# Patient Record
Sex: Female | Born: 1965
Health system: Southern US, Community
[De-identification: ages and names within clinical notes are randomized; demographics above are authoritative.]

## PROBLEM LIST (undated history)

## (undated) DIAGNOSIS — F329 Major depressive disorder, single episode, unspecified: Secondary | ICD-10-CM

## (undated) DIAGNOSIS — Z8489 Family history of other specified conditions: Secondary | ICD-10-CM

## (undated) DIAGNOSIS — M1990 Unspecified osteoarthritis, unspecified site: Secondary | ICD-10-CM

## (undated) DIAGNOSIS — D649 Anemia, unspecified: Secondary | ICD-10-CM

## (undated) DIAGNOSIS — R51 Headache: Secondary | ICD-10-CM

## (undated) DIAGNOSIS — F32A Depression, unspecified: Secondary | ICD-10-CM

## (undated) DIAGNOSIS — J40 Bronchitis, not specified as acute or chronic: Secondary | ICD-10-CM

## (undated) DIAGNOSIS — F429 Obsessive-compulsive disorder, unspecified: Secondary | ICD-10-CM

## (undated) DIAGNOSIS — D569 Thalassemia, unspecified: Secondary | ICD-10-CM

## (undated) DIAGNOSIS — D573 Sickle-cell trait: Secondary | ICD-10-CM

## (undated) DIAGNOSIS — F419 Anxiety disorder, unspecified: Secondary | ICD-10-CM

## (undated) DIAGNOSIS — K219 Gastro-esophageal reflux disease without esophagitis: Secondary | ICD-10-CM

## (undated) DIAGNOSIS — R140 Abdominal distension (gaseous): Secondary | ICD-10-CM

## (undated) DIAGNOSIS — R05 Cough: Secondary | ICD-10-CM

## (undated) DIAGNOSIS — R112 Nausea with vomiting, unspecified: Secondary | ICD-10-CM

## (undated) DIAGNOSIS — R059 Cough, unspecified: Secondary | ICD-10-CM

## (undated) HISTORY — DX: Cough, unspecified: R05.9

## (undated) HISTORY — DX: Anemia, unspecified: D64.9

## (undated) HISTORY — DX: Cough: R05

## (undated) HISTORY — DX: Abdominal distension (gaseous): R14.0

## (undated) HISTORY — DX: Nausea with vomiting, unspecified: R11.2

---

## 1998-01-23 HISTORY — PX: MOUTH SURGERY: SHX715

## 1999-09-14 ENCOUNTER — Other Ambulatory Visit: Admission: RE | Admit: 1999-09-14 | Discharge: 1999-09-14 | Payer: Self-pay | Admitting: *Deleted

## 2001-02-05 ENCOUNTER — Emergency Department (HOSPITAL_COMMUNITY): Admission: EM | Admit: 2001-02-05 | Discharge: 2001-02-06 | Payer: Self-pay | Admitting: Emergency Medicine

## 2001-03-13 ENCOUNTER — Ambulatory Visit (HOSPITAL_COMMUNITY): Admission: RE | Admit: 2001-03-13 | Discharge: 2001-03-13 | Payer: Self-pay | Admitting: Family Medicine

## 2001-03-13 ENCOUNTER — Encounter: Payer: Self-pay | Admitting: Family Medicine

## 2002-01-02 ENCOUNTER — Ambulatory Visit (HOSPITAL_COMMUNITY): Admission: RE | Admit: 2002-01-02 | Discharge: 2002-01-02 | Payer: Self-pay | Admitting: Family Medicine

## 2002-01-02 ENCOUNTER — Encounter: Payer: Self-pay | Admitting: Family Medicine

## 2002-01-06 ENCOUNTER — Ambulatory Visit: Admission: RE | Admit: 2002-01-06 | Discharge: 2002-01-06 | Payer: Self-pay | Admitting: Family Medicine

## 2002-02-14 ENCOUNTER — Other Ambulatory Visit: Admission: RE | Admit: 2002-02-14 | Discharge: 2002-02-14 | Payer: Self-pay | Admitting: Family Medicine

## 2002-03-11 ENCOUNTER — Ambulatory Visit (HOSPITAL_BASED_OUTPATIENT_CLINIC_OR_DEPARTMENT_OTHER): Admission: RE | Admit: 2002-03-11 | Discharge: 2002-03-11 | Payer: Self-pay | Admitting: Family Medicine

## 2004-04-20 ENCOUNTER — Emergency Department (HOSPITAL_COMMUNITY): Admission: EM | Admit: 2004-04-20 | Discharge: 2004-04-20 | Payer: Self-pay | Admitting: Family Medicine

## 2004-05-28 ENCOUNTER — Emergency Department (HOSPITAL_COMMUNITY): Admission: EM | Admit: 2004-05-28 | Discharge: 2004-05-28 | Payer: Self-pay | Admitting: Family Medicine

## 2006-03-21 ENCOUNTER — Other Ambulatory Visit: Admission: RE | Admit: 2006-03-21 | Discharge: 2006-03-21 | Payer: Self-pay | Admitting: *Deleted

## 2007-12-30 ENCOUNTER — Other Ambulatory Visit: Admission: RE | Admit: 2007-12-30 | Discharge: 2007-12-30 | Payer: Self-pay | Admitting: Family Medicine

## 2008-05-25 ENCOUNTER — Encounter: Admission: RE | Admit: 2008-05-25 | Discharge: 2008-05-25 | Payer: Self-pay | Admitting: Family Medicine

## 2008-05-25 IMAGING — CR DG CHEST 2V
2 series · 2 of 2 positions shown · non-contrast
Comparison: No prior studies.

CLINICAL DATA: Shortness of breath, asthma exacerbation.

CHEST - 2 VIEW

[view not recorded (1 of 2)]
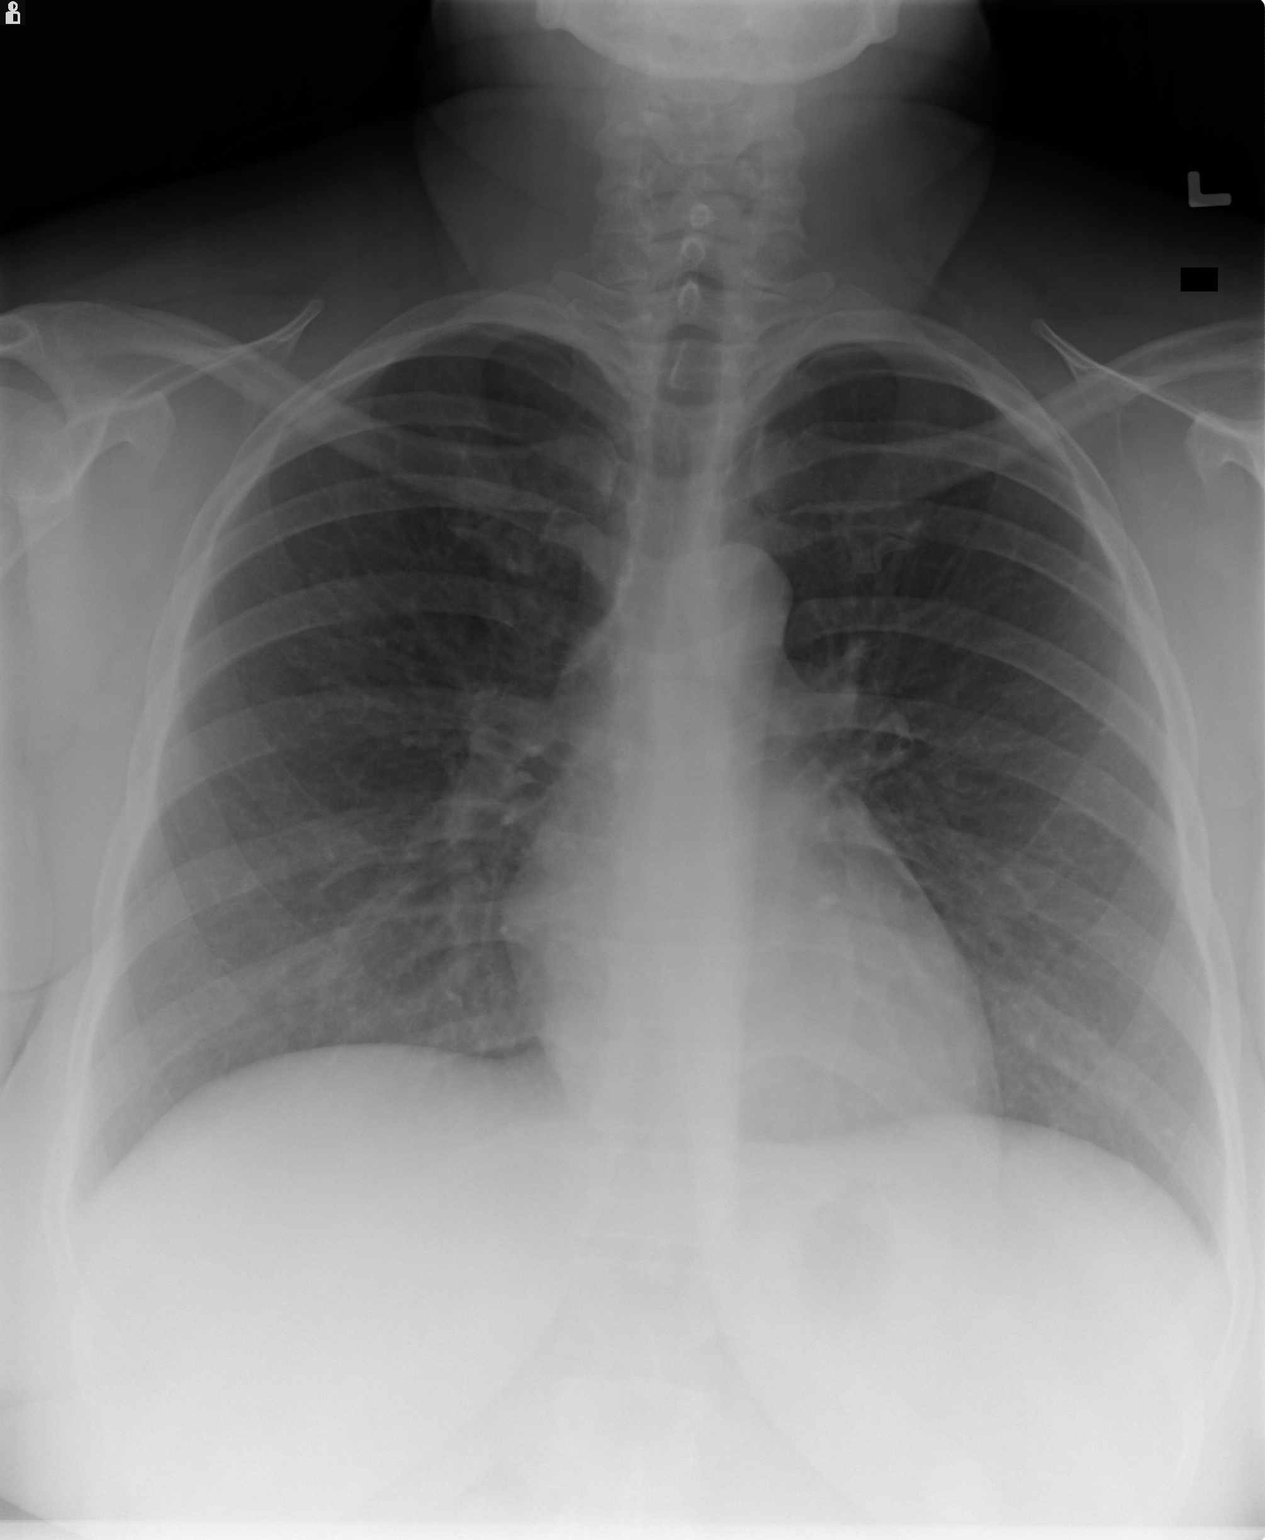

[view not recorded (2 of 2)]
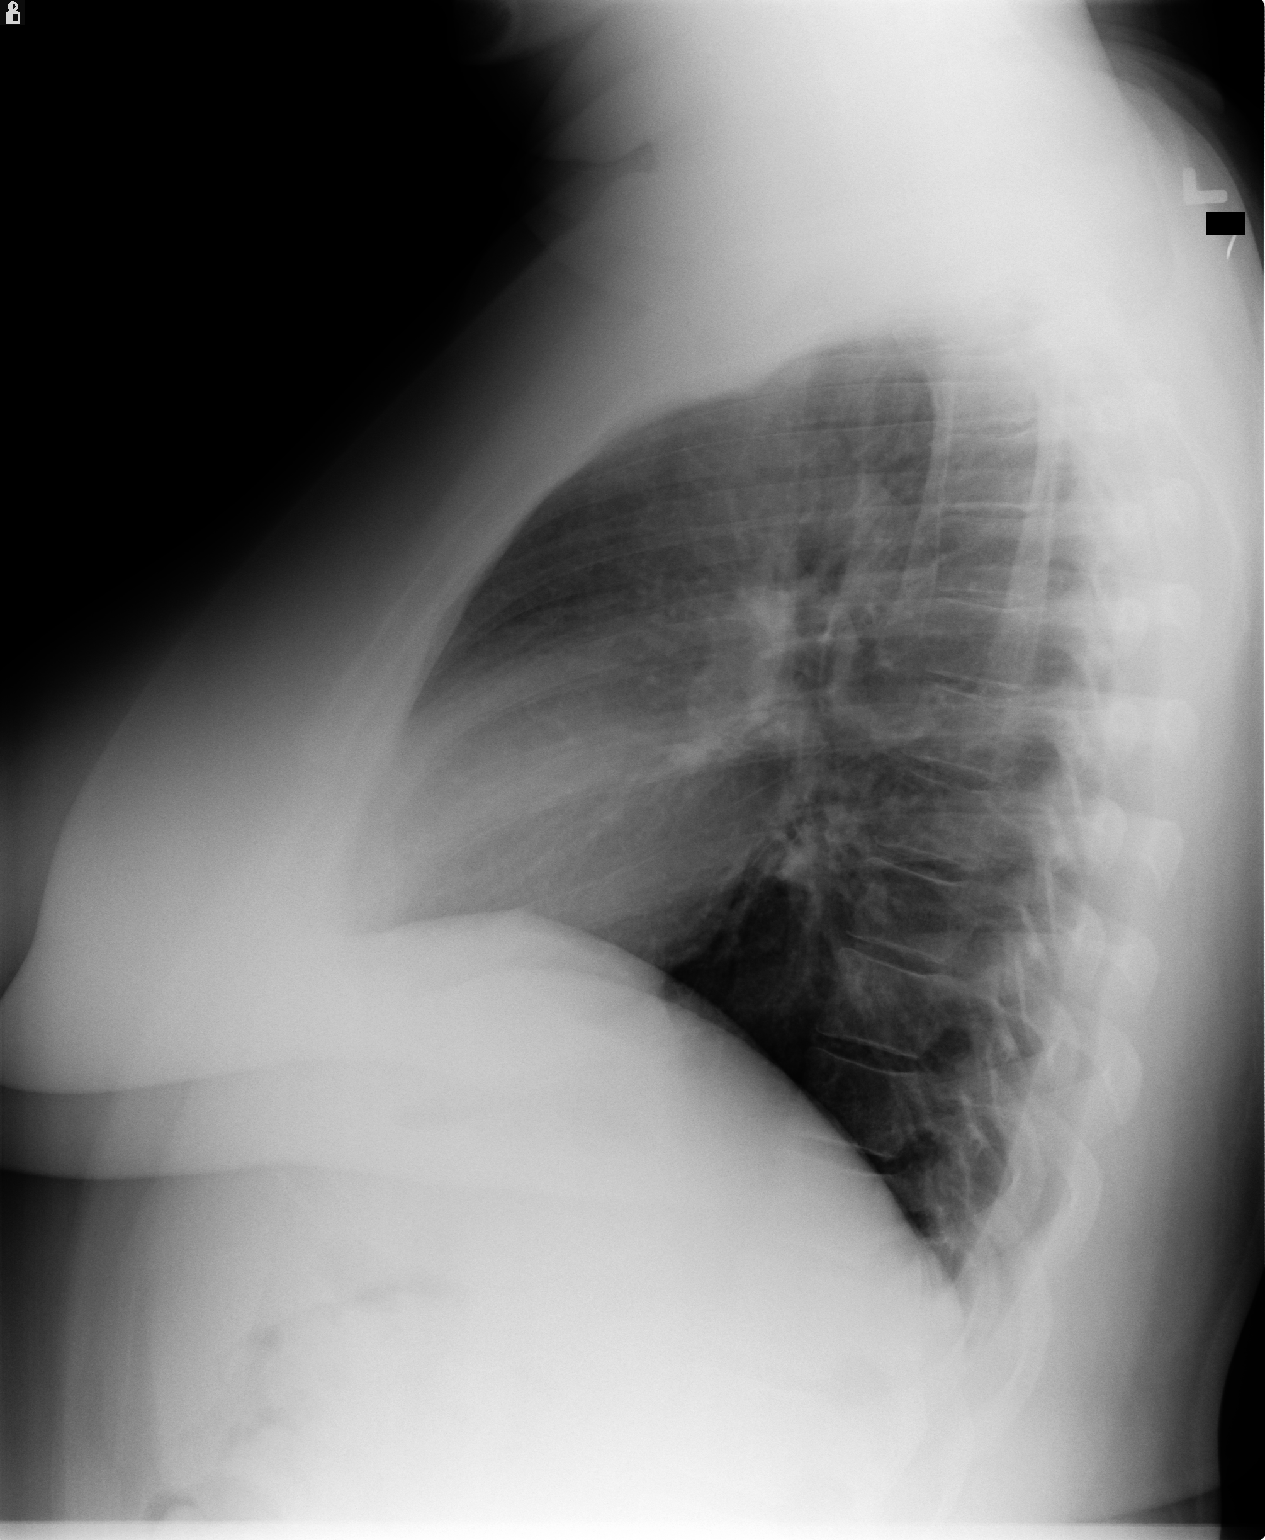

[2 of 2 positions shown; findings below may reference images not displayed]

FINDINGS: The lungs are clear.  The heart mediastinal structures
are normal.  The osseous structures are unremarkable.
IMPRESSION: Normal study.

## 2008-07-01 DIAGNOSIS — F429 Obsessive-compulsive disorder, unspecified: Secondary | ICD-10-CM | POA: Insufficient documentation

## 2008-07-01 DIAGNOSIS — E669 Obesity, unspecified: Secondary | ICD-10-CM | POA: Insufficient documentation

## 2008-07-01 DIAGNOSIS — D573 Sickle-cell trait: Secondary | ICD-10-CM | POA: Insufficient documentation

## 2008-07-01 DIAGNOSIS — F329 Major depressive disorder, single episode, unspecified: Secondary | ICD-10-CM | POA: Insufficient documentation

## 2008-07-02 ENCOUNTER — Ambulatory Visit: Payer: Self-pay | Admitting: Pulmonary Disease

## 2008-07-02 DIAGNOSIS — R059 Cough, unspecified: Secondary | ICD-10-CM | POA: Insufficient documentation

## 2008-07-02 DIAGNOSIS — R519 Headache, unspecified: Secondary | ICD-10-CM | POA: Insufficient documentation

## 2008-07-02 DIAGNOSIS — R51 Headache: Secondary | ICD-10-CM | POA: Insufficient documentation

## 2008-07-02 DIAGNOSIS — J309 Allergic rhinitis, unspecified: Secondary | ICD-10-CM | POA: Insufficient documentation

## 2008-07-02 DIAGNOSIS — R0602 Shortness of breath: Secondary | ICD-10-CM | POA: Insufficient documentation

## 2008-07-02 DIAGNOSIS — R05 Cough: Secondary | ICD-10-CM

## 2008-07-29 ENCOUNTER — Ambulatory Visit: Payer: Self-pay | Admitting: Pulmonary Disease

## 2008-09-04 ENCOUNTER — Telehealth (INDEPENDENT_AMBULATORY_CARE_PROVIDER_SITE_OTHER): Payer: Self-pay | Admitting: *Deleted

## 2010-02-22 NOTE — Progress Notes (Signed)
Summary: prescript  Phone Note Call from Patient   Caller: Patient Call For: clance Summary of Call: zegerid no longer covered by insurance Initial call taken by: Rickard Patience,  September 04, 2008 8:51 AM  Follow-up for Phone Call        Spoke with pt.  She states that zeferid is not covered.   Needs alternative.  Advised that she should request formulary from insurance so we will know what will be covered.  Pt to call back and let us know. Follow-up by: Vernie Murders,  September 04, 2008 9:44 AM

## 2010-02-22 NOTE — Assessment & Plan Note (Signed)
Summary: consult for dyspnea   Copy to:  Miranda Padilla Primary Provider/Referring Provider:  [name]  CC:  Pulmonary Consult.  History of Present Illness: The pt comes in today for evaluation dyspnea.  She was diagnosed with "asthma" 20 years ago, but never had spirometry to verify a diagnosis.  She was given an inhaler to use, and really did not continue with it.  She had no further breathing issues, and has done well, until April of this year.  She developed severe allergy symptoms with head congestion, then cough with purulent mucus, then chest congestion.  She was treated with a course of abx, and improved except for a persistent cough.  She later began to have worsening sob, and felt to have a possible asthma flare.  She was given a course of prednisone, and started on a bronchodilator regimen with nebs for rescue.  She thinks it may have helped, but keeps having episodes of sob which requires her to use her nebulizer treatments multiple times everyday despite being on good maintenance medications for asthma.  She has persistent dyspnea with activity and at rest.  She has panting-like respirations just talking with me today.  She has no worsening LE edema, but does have chest discomfort that she describes as an ache that goes thru to her back.  She denies pleuritic chest pain.  She also has a h/o significant reflux symptoms that persist despite taking prilosec.  She has made the observation that her cough is much better with the PPI.  The pt has actually lost 70 pounds in the past 2 years.  She also notes some proximal muscle weakness on questioning, for things such as brushing hair and teeth, as well as getting up out of a chair.  Preventive Screening-Counseling & Management  Alcohol-Tobacco     Smoking Status: never  Current Medications (verified): 1)  Proventil Hfa 108 (90 Base) Mcg/act  Aers (Albuterol Sulfate) .Marland Kitchen.. 1-2 Puffs Every 4-6 Hours As Needed 2)  Xopenex 1.25 Mg/35ml Nebu  (Levalbuterol Hcl) .... Use 1 Vial Via Nebulizer Every 8 Hours As Needed 3)  Advair Diskus 250-50 Mcg/dose Misc (Fluticasone-Salmeterol) .... Inhale 1 Puff Two Times A Day 4)  Singulair 10 Mg Tabs (Montelukast Sodium) .... Take 1 Tablet By Mouth Once A Day 5)  Prilosec Otc 20 Mg Tbec (Omeprazole Magnesium) .... Take 1 Tablet By Mouth Once A Day  Allergies (verified): 1)  ! Sulfa  Past History:  Past Medical History:   HEADACHE, CHRONIC (ICD-784.0) ALLERGIC RHINITIS (ICD-477.9) SICKLE CELL TRAIT (ICD-282.5) DEPRESSION (ICD-311) OBESITY (ICD-278.00) OBSESSIVE-COMPULSIVE DISORDER (ICD-300.3)    Family History: Reviewed history and no changes required. allergies:mother heart disease: father  clotting disorders: mother cancer: mother (unsure what kind), maternal grandmother (breast)   Social History: Reviewed history and no changes required. Patient never smoked.  pt is married. pt does not have any children. pt works as an Engineer, technical sales.  Smoking Status:  never  Review of Systems       The patient complains of shortness of breath with activity, shortness of breath at rest, productive cough, non-productive cough, indigestion, loss of appetite, weight change, sore throat, sneezing, anxiety, depression, and joint stiffness or pain.  The patient denies coughing up blood, chest pain, irregular heartbeats, acid heartburn, abdominal pain, difficulty swallowing, tooth/dental problems, headaches, nasal congestion/difficulty breathing through nose, itching, ear ache, hand/feet swelling, rash, change in color of mucus, and fever.    Vital Signs:  Patient profile:   45 year old female Height:  65 inches Weight:      306.38 pounds BMI:     51.17 O2 Sat:      98 % on Room air Temp:     98.2 degrees F oral Pulse rate:   96 / minute BP sitting:   110 / 72  (left arm) Cuff size:   regular  Vitals Entered By: Arman Filter LPN (July 02, 2008 11:01 AM)  O2 Flow:  Room air  Serial  Vital Signs/Assessments:  Comments: 12:11 PM Ambulatory Pulse Oximetry  Resting; HR__99___    02 Sat__99%ra___  Lap1 (185 feet)   HR___113__   02 Sat__97%ra___ Lap2 (185 feet)   HR__130___   02 Sat___96%ra__    Lap3 (185 feet)   HR__135___   02 Sat__97%ra___  _X__Test Completed without Difficulty ___Test Stopped due to:  Arman Filter LPN  July 02, 2008 12:12 PM  By: Arman Filter LPN   CC: Pulmonary Consult Comments Medications reviewed with patient Arman Filter LPN  July 02, 2008 11:01 AM    Physical Exam  General:  morbidly obese female in nad Eyes:  PERRLA and EOMI.   Nose:  patent without discharge Mouth:  mod elongation of soft palate and uvula Neck:  no jvd, LN.  ?slightly prominent thyroid Lungs:  totally clear panting respiration with very poor depth of inspiration. Heart:  rrr, no mrg Abdomen:  soft and nontender, bs+ Extremities:  trace edema, pulses intact distally no clubbing or cyanosis. Neurologic:  alert and oriented, moves all 4.   Impression & Recommendations:  Problem # 1:  DYSPNEA (ICD-786.05) The pt is having significant dyspnea at rest and with exertion that does not sound c/w asthma.  She is also on multiple meds which usually treat asthma quite well, and is having little response.  I am struck by her panting respirations, and shallow depth of inspiration.  It is unclear whether this is simply due to restrictive disease from her obesity, but she has actually lost weight recently.  I also considered whether she may be focusing too much on her breathing pattern, and thus not occurring naturally.  Her cxr is unremarkable, and she does not desaturate with ambulation although her heart rate becomes very tachycardic with short distance.  This raises the question of a cardiac issue?Marland Kitchen  She certainly is at risk for PE as well.  Finally, I considered whether she may have some type of neuromuscular issue causing all of this?  At this point, would like to  discontinue all asthma meds, and do full pfts in 2 weeks.  May also consider testing to r/o PE as well.  Will also check to see if she has had recent thyroid testing.  Problem # 2:  COUGH (ICD-786.2) I suspect this is a totally separate problem from her dyspnea.  She is describing ongoing reflux, and I think we should treat this more aggressively.  Medications Added to Medication List This Visit: 1)  Proventil Hfa 108 (90 Base) Mcg/act Aers (Albuterol sulfate) .Marland Kitchen.. 1-2 puffs every 4-6 hours as needed 2)  Prilosec Otc 20 Mg Tbec (Omeprazole magnesium) .... Take 1 tablet by mouth once a day  Other Orders: Consultation Level V (52841) Pulmonary Referral (Pulmonary) Pulse Oximetry, Ambulatory (32440)  Patient Instructions: 1)  stop advair, singulair, neb treatments. 2)  only use albuterol if having a breathing emergency.   3)  If you get short of breath, sit down, rest, and focus on taking deep breaths. 4)  will schedule for breathing tests in  2 weeks, then see you back the same day 5)  stop prilosec 6)  take zegerid one in am and pm for next 2 weeks  Appended Document: consult for dyspnea no tft's...needs.

## 2010-02-22 NOTE — Miscellaneous (Signed)
Summary: Orders Update pft charges  Clinical Lists Changes  Orders: Added new Service order of Carbon Monoxide diffusing w/capacity (94720) - Signed Added new Service order of Lung Volumes (94240) - Signed Added new Service order of Spirometry (Pre & Post) (94060) - Signed 

## 2010-02-22 NOTE — Assessment & Plan Note (Signed)
Summary: rov for dyspnea and cough   Copy to:  Miranda Padilla Primary Miranda Padilla/Referring Miranda Padilla:  Miranda Padilla  CC:  Pt is here for a f/u appt to discuss PFT results.  Pt finished Zegerid samples.   pt states she did feel better while on Zegerid- less bloating.   Marland Kitchen  History of Present Illness: The pt comes in today for f/u of her pfts.  At the last visit, she was placed on zegerid for gerd symtpoms and cough, and has seen significant improvement with the two times a day ppi.  She even feels that it has helped her breathing, and she is not "panting" today the way she was last visit.  Her pfts were done for w/u of dyspnea, and show no obstruction, no restriction, and a mild decrease in dlco that corrects to normal with alveolar volume adjustment.  I have gone over the study with her, and answered all of her questions.  Current Medications (verified): 1)  Proventil Hfa 108 (90 Base) Mcg/act  Aers (Albuterol Sulfate) .Marland Kitchen.. 1-2 Puffs Every 4-6 Hours As Needed  Allergies (verified): 1)  ! Sulfa  Vital Signs:  Patient profile:   45 year old female Height:      65 inches Weight:      305 pounds BMI:     50.94 O2 Sat:      95 % on Room air Temp:     98.0 degrees F oral Pulse rate:   96 / minute BP sitting:   106 / 70  (left arm) Cuff size:   large  Vitals Entered By: Arman Filter LPN (July 29, 1189 2:58 PM)  O2 Flow:  Room air CC: Pt is here for a f/u appt to discuss PFT results.  Pt finished Zegerid samples.   pt states she did feel better while on Zegerid- less bloating.    Comments Medications reviewed with patient Arman Filter LPN  July 29, 4780 2:58 PM    Physical Exam  General:  morbidly obese female in nad   Impression & Recommendations:  Problem # 1:  COUGH (ICD-786.2) much improved with aggressive treatment with PPI.  I would recommend staying on this dose until she is able to lose weight.  I think this has also helped her breathing.  Problem # 2:  DYSPNEA  (ICD-786.05) her pfts show no significant pulmonary issue.  I suspect that a lot of this is due to her obesity and deconditioning.  If this continues to be an issue, would recommend cardiac w/u for completeness.  I will leave this to her primary MD.  Medications Added to Medication List This Visit: 1)  Zegerid 40-1100 Mg Caps (Omeprazole-sodium bicarbonate) .... One by mouth in am and pm  Other Orders: Est. Patient Level III (95621)  Patient Instructions: 1)  stay off inhalers 2)  will keep you on high dose medicine for reflux since this seems to be helping you.  Will give you a prescription for zegerid, but can change to different med if insurance covers something else. 3)  work on weight loss and exercise program. 4)  I will send a note to your primary md to consider cardiac evaluation if symptoms persist. 5)  follow up with me as needed.  Prescriptions: ZEGERID 40-1100 MG  CAPS (OMEPRAZOLE-SODIUM BICARBONATE) One by mouth in am and pm  #60 x 6   Entered and Authorized by:   Barbaraann Share MD   Signed by:   Barbaraann Share MD  on 07/29/2008   Method used:   Print then Give to Patient   RxID:   [id number]

## 2011-01-07 ENCOUNTER — Encounter: Payer: Self-pay | Admitting: Emergency Medicine

## 2011-01-07 ENCOUNTER — Emergency Department (HOSPITAL_BASED_OUTPATIENT_CLINIC_OR_DEPARTMENT_OTHER)
Admission: EM | Admit: 2011-01-07 | Discharge: 2011-01-07 | Disposition: A | Discharge status: Home / Self Care | Payer: 59 | Attending: Emergency Medicine | Admitting: Emergency Medicine

## 2011-01-07 DIAGNOSIS — R112 Nausea with vomiting, unspecified: Secondary | ICD-10-CM | POA: Insufficient documentation

## 2011-01-07 DIAGNOSIS — J069 Acute upper respiratory infection, unspecified: Secondary | ICD-10-CM | POA: Insufficient documentation

## 2011-01-07 DIAGNOSIS — R109 Unspecified abdominal pain: Secondary | ICD-10-CM | POA: Insufficient documentation

## 2011-01-07 HISTORY — DX: Gastro-esophageal reflux disease without esophagitis: K21.9

## 2011-01-07 LAB — COMPREHENSIVE METABOLIC PANEL
ALT: 12 U/L (ref 0–35)
AST: 13 U/L (ref 0–37)
Albumin: 3.7 g/dL (ref 3.5–5.2)
Alkaline Phosphatase: 57 U/L (ref 39–117)
BUN: 10 mg/dL (ref 6–23)
CO2: 26 mEq/L (ref 19–32)
Calcium: 9.3 mg/dL (ref 8.4–10.5)
Chloride: 106 mEq/L (ref 96–112)
Creatinine, Ser: 0.7 mg/dL (ref 0.50–1.10)
GFR calc Af Amer: >90 mL/min (ref >90)
GFR calc non Af Amer: >90 mL/min (ref >90)
Glucose, Bld: 111 mg/dL — ABNORMAL HIGH (ref 70–99)
Potassium: 4 mEq/L (ref 3.5–5.1)
Sodium: 140 mEq/L (ref 135–145)
Total Bilirubin: 0.5 mg/dL (ref 0.3–1.2)
Total Protein: 7.8 g/dL (ref 6.0–8.3)

## 2011-01-07 LAB — DIFFERENTIAL
Basophils Absolute: 0 10*3/uL (ref 0.0–0.1)
Basophils Relative: 1 % (ref 0–1)
Eosinophils Absolute: 0.2 10*3/uL (ref 0.0–0.7)
Eosinophils Relative: 4 % (ref 0–5)
Lymphocytes Relative: 26 % (ref 12–46)
Lymphs Abs: 1.2 10*3/uL (ref 0.7–4.0)
Monocytes Absolute: 0.6 10*3/uL (ref 0.1–1.0)
Monocytes Relative: 13 % — ABNORMAL HIGH (ref 3–12)
Neutro Abs: 2.6 10*3/uL (ref 1.7–7.7)
Neutrophils Relative %: 57 % (ref 43–77)

## 2011-01-07 LAB — CBC
HCT: 34 % — ABNORMAL LOW (ref 36.0–46.0)
Hemoglobin: 11.3 g/dL — ABNORMAL LOW (ref 12.0–15.0)
MCH: 24.7 pg — ABNORMAL LOW (ref 26.0–34.0)
MCHC: 33.2 g/dL (ref 30.0–36.0)
MCV: 74.4 fL — ABNORMAL LOW (ref 78.0–100.0)
Platelets: 308 10*3/uL (ref 150–400)
RBC: 4.57 MIL/uL (ref 3.87–5.11)
RDW: 15.2 % (ref 11.5–15.5)
WBC: 4.6 10*3/uL (ref 4.0–10.5)

## 2011-01-07 LAB — URINALYSIS, ROUTINE W REFLEX MICROSCOPIC
Bilirubin Urine: NEGATIVE
Glucose, UA: NEGATIVE mg/dL
Ketones, ur: NEGATIVE mg/dL
Leukocytes, UA: NEGATIVE
Nitrite: NEGATIVE
Protein, ur: NEGATIVE mg/dL
Specific Gravity, Urine: 1.011 (ref 1.005–1.030)
Urobilinogen, UA: 0.2 mg/dL (ref 0.0–1.0)
pH: 6.5 (ref 5.0–8.0)

## 2011-01-07 LAB — URINE MICROSCOPIC-ADD ON

## 2011-01-07 LAB — LIPASE, BLOOD: Lipase: 19 U/L (ref 11–59)

## 2011-01-07 LAB — RAPID STREP SCREEN (MED CTR MEBANE ONLY): Streptococcus, Group A Screen (Direct): NEGATIVE

## 2011-01-07 LAB — PREGNANCY, URINE: Preg Test, Ur: NEGATIVE

## 2011-01-07 MED ORDER — OXYCODONE-ACETAMINOPHEN 5-325 MG PO TABS: 2.0000 | ORAL_TABLET | ORAL | Status: AC | PRN

## 2011-01-07 MED ORDER — METOCLOPRAMIDE HCL 10 MG PO TABS: 10.0000 mg | ORAL_TABLET | Freq: Four times a day (QID) | ORAL | Status: AC | PRN

## 2011-01-07 MED ORDER — OMEPRAZOLE 20 MG PO CPDR: 20.0000 mg | DELAYED_RELEASE_CAPSULE | Freq: Every day | ORAL | Status: DC

## 2011-01-07 NOTE — ED Notes (Signed)
Crae plan and follow up for CT scan reviewed

## 2011-01-07 NOTE — ED Provider Notes (Signed)
History     CSN: [id number] Arrival date & time: 01/07/2011  6:58 AM   First MD Initiated Contact with Patient 01/07/11 0710      Chief Complaint  Patient presents with  . Sore Throat  . Emesis   abdominal pain  (Consider location/radiation/quality/duration/timing/severity/associated sxs/prior treatment) HPI This 45 year old female has several months of intermittent abdominal pain that lasts for several hours at a time when she gets it. It usually occurs at night. Usually it wakes her up from sleep. The abdominal pain is located in the epigastrium and right upper quadrant and radiates at times to the shoulder and upper back. The abdominal pain is nonexertional nonpleuritic in without chest pain shortness of breath or cough. The abdominal pain gradually comes on over several hours in gradually goes away. She had blood counts her doctor that were unremarkable in the past including negative H. pylori for this pain. She has not had ultrasound imaging or upper GI swallow or upper endoscopy for this pain. The pain is typically there every day for a week or 2 a time and then may go away for a week and then return for a week or more her current episodes have been occurring every night lasting several hours at a time for the last week. Her pain is almost gone this morning. Her pain is associated with nausea and vomiting at times and it is nonbloody. She does not have bloody stools dysuria vaginal bleeding or vaginal discharge. She does not have lower abdominal pain. The last 2 days she also has a sore throat and some nasal congestion without cough fevers myalgias arthralgias rash hallucinations confusion or other concerns. Her abdominal pain has been present for the last 6 or 7  hours prior to arrival. The abdominal pain is now mild and is worse with palpation but not exertion. She vomits a few times per night when she gets her abdominal pain spells. Past Medical History  Diagnosis Date  . GERD  (gastroesophageal reflux disease)   anemia  History reviewed. No pertinent past surgical history.  No family history on file.  History  Substance Use Topics  . Smoking status: Never Smoker   . Smokeless tobacco: Not on file  . Alcohol Use: No    OB History    Grav Para Term Preterm Abortions TAB SAB Ect Mult Living                  Review of Systems  Constitutional: Negative for fever.       10 Systems reviewed and are negative for acute change except as noted in the HPI.  HENT: Positive for congestion and sore throat.   Eyes: Negative for discharge and redness.  Respiratory: Negative for cough and shortness of breath.   Cardiovascular: Negative for chest pain.  Gastrointestinal: Positive for nausea, vomiting and abdominal pain. Negative for diarrhea and blood in stool.  Genitourinary: Negative for dysuria, vaginal bleeding and vaginal discharge.  Musculoskeletal: Negative for back pain.  Skin: Negative for rash.  Neurological: Negative for syncope, numbness and headaches.  Psychiatric/Behavioral:       No behavior change.    Allergies  Sulfonamide derivatives  Home Medications   Current Outpatient Rx  Name Route Sig Dispense Refill  . RANITIDINE HCL 150 MG PO TABS Oral Take 150 mg by mouth 2 (two) times daily.      Marland Kitchen METOCLOPRAMIDE HCL 10 MG PO TABS Oral Take 1 tablet (10 mg total) by mouth every 6 (six)  hours as needed (nausea/headache). 6 tablet 0  . OMEPRAZOLE 20 MG PO CPDR Oral Take 1 capsule (20 mg total) by mouth daily. 5 capsule 0  . OXYCODONE-ACETAMINOPHEN 5-325 MG PO TABS Oral Take 2 tablets by mouth every 4 (four) hours as needed for pain. 10 tablet 0    BP 119/85  Pulse 82  Temp(Src) 98.1 F (36.7 C) (Oral)  Resp 18  SpO2 100%  Physical Exam  Nursing note and vitals reviewed. Constitutional:       Awake, alert, nontoxic appearance.  HENT:  Head: Atraumatic.  Mouth/Throat: Oropharynx is clear and moist. No oropharyngeal exudate.  Eyes: Right  eye exhibits no discharge. Left eye exhibits no discharge.  Neck: Neck supple.  Cardiovascular: Normal rate and regular rhythm.   No murmur heard. Pulmonary/Chest: Effort normal and breath sounds normal. No respiratory distress. She has no wheezes. She has no rales. She exhibits no tenderness.  Abdominal: Soft. She exhibits no mass. There is tenderness. There is no rebound and no guarding.       Mild epigastric and right upper quadrant tenderness without rebound; no CVA tenderness, back is nontender  Musculoskeletal: She exhibits no tenderness.       Baseline ROM, no obvious new focal weakness.  Neurological:       Mental status and motor strength appears baseline for patient and situation.  Skin: No rash noted.  Psychiatric: She has a normal mood and affect.    ED Course  Procedures (including critical care time)  Labs Reviewed  CBC - Abnormal; Notable for the following:    Hemoglobin 11.3 (*)    HCT 34.0 (*)    MCV 74.4 (*)    MCH 24.7 (*)    All other components within normal limits  DIFFERENTIAL - Abnormal; Notable for the following:    Monocytes Relative 13 (*)    All other components within normal limits  COMPREHENSIVE METABOLIC PANEL - Abnormal; Notable for the following:    Glucose, Bld 111 (*)    All other components within normal limits  URINALYSIS, ROUTINE W REFLEX MICROSCOPIC - Abnormal; Notable for the following:    Hgb urine dipstick MODERATE (*)    All other components within normal limits  URINE MICROSCOPIC-ADD ON - Abnormal; Notable for the following:    Squamous Epithelial / LPF FEW (*)    Bacteria, UA FEW (*)    All other components within normal limits  LIPASE, BLOOD  PREGNANCY, URINE  RAPID STREP SCREEN   No results found.   1. Abdominal pain   2. Upper respiratory infection       MDM  Possible biliary colic, no [place] available, feel reasonable for recheck tomorrow in ED including consider [place].  I doubt any other EMC precluding discharge at this  time including, but not necessarily limited to the following:peritonitis, sepsis, SBI.        Hurman Horn, MD 01/07/11 2206

## 2011-01-07 NOTE — ED Notes (Signed)
Pt c/o sore throat since yesterday. Pt reports vomiting this morning. Pt has recent hx of periodic vomiting and has seen PMD for same.

## 2011-01-08 ENCOUNTER — Ambulatory Visit (HOSPITAL_BASED_OUTPATIENT_CLINIC_OR_DEPARTMENT_OTHER)
Admission: RE | Admit: 2011-01-08 | Discharge: 2011-01-08 | Disposition: A | Discharge status: Home / Self Care | Payer: 59 | Source: Ambulatory Visit | Attending: Emergency Medicine | Admitting: Emergency Medicine

## 2011-01-08 DIAGNOSIS — R1013 Epigastric pain: Secondary | ICD-10-CM | POA: Insufficient documentation

## 2011-01-08 DIAGNOSIS — R1011 Right upper quadrant pain: Secondary | ICD-10-CM | POA: Insufficient documentation

## 2011-01-08 IMAGING — US US ABDOMEN COMPLETE
1 series · 13 of 25 positions shown · non-contrast
Comparison: None.

CLINICAL DATA: Right upper quadrant pain, epigastric pain off and
on for 1 month.

COMPLETE ABDOMINAL ULTRASOUND

[Series 1: us abdomen complete · 0.35mm/px · 13 of 79 slices shown]
[im 1/79]
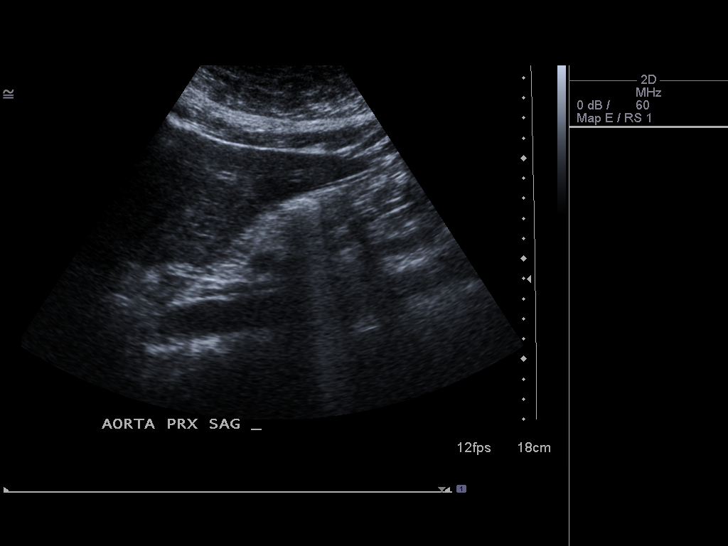
[im 7/79]
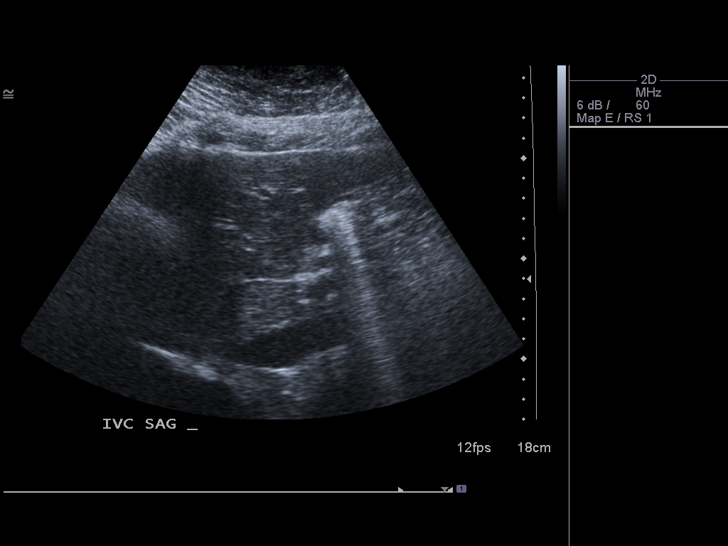
[im 14/79]
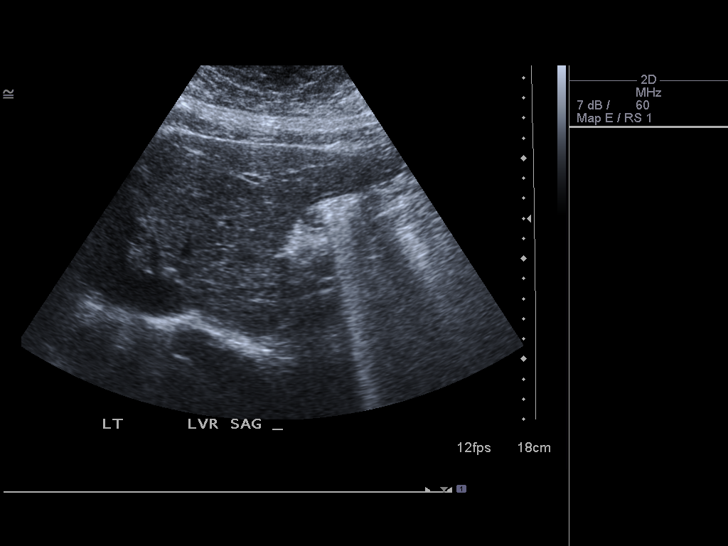
[im 20/79]
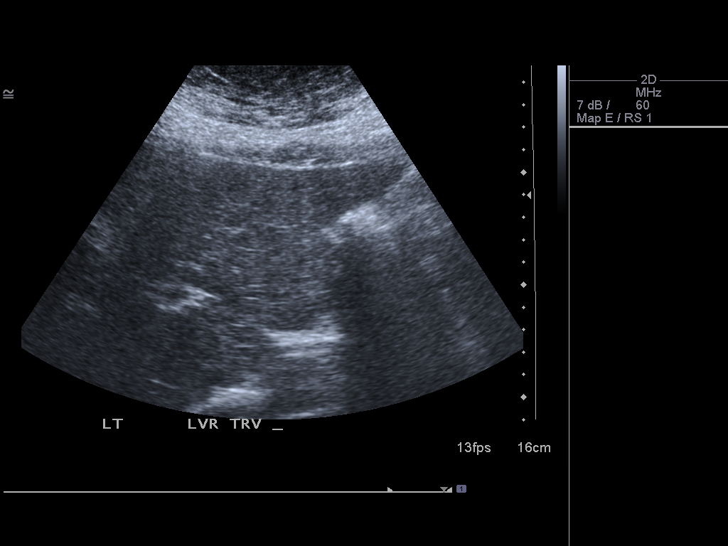
[im 27/79]
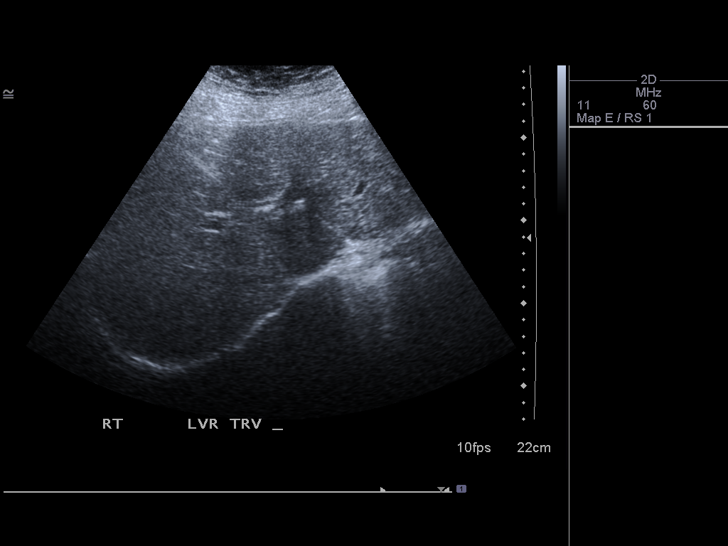
[im 33/79]
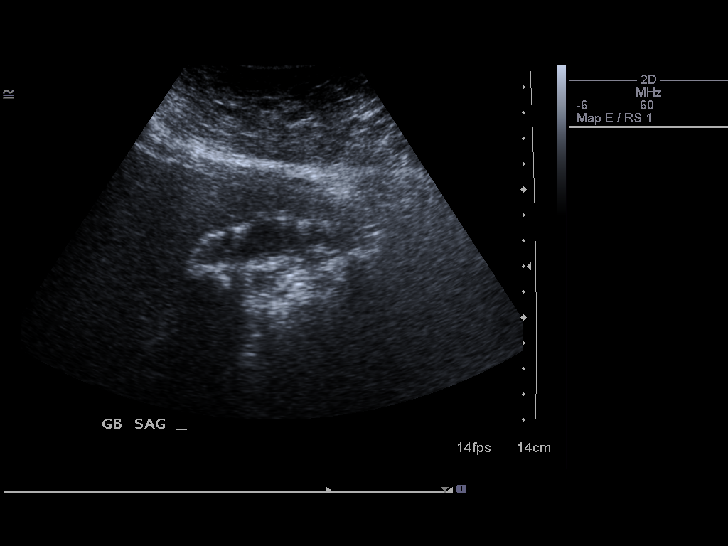
[im 40/79]
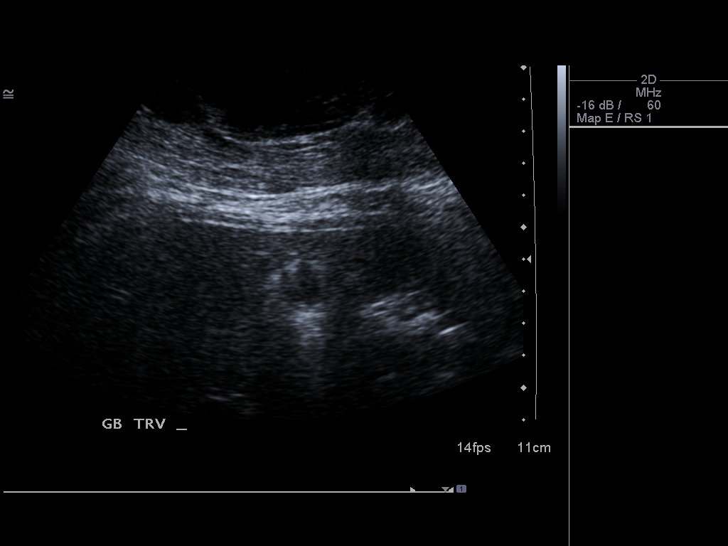
[im 46/79]
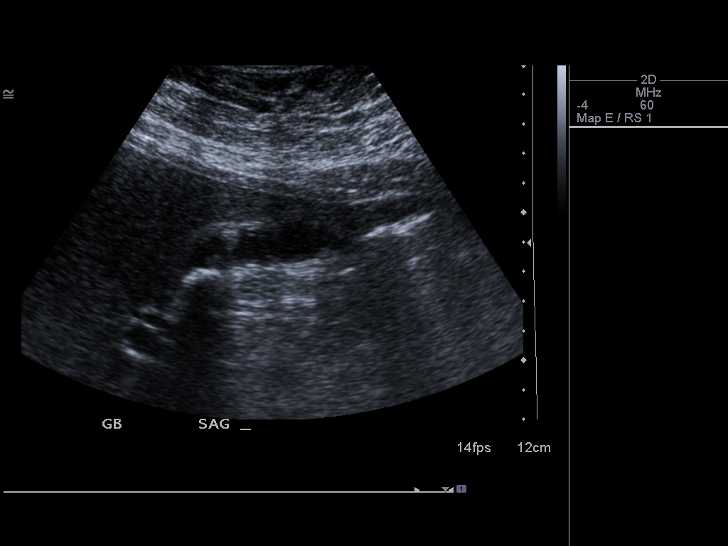
[im 53/79]
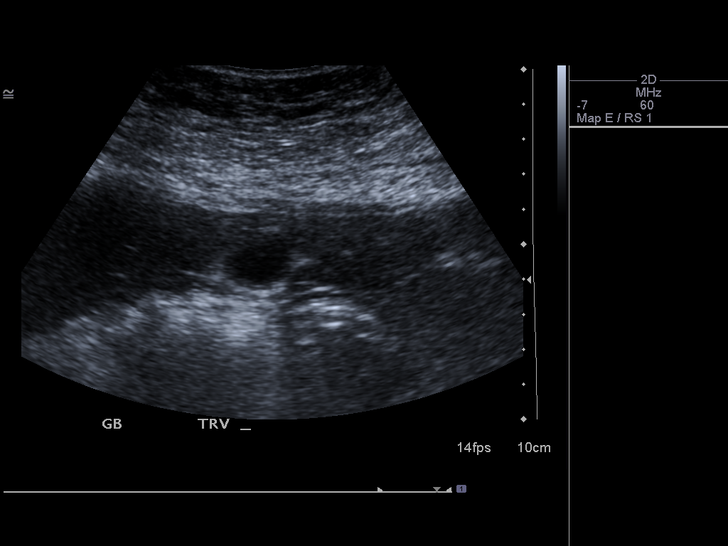
[im 59/79]
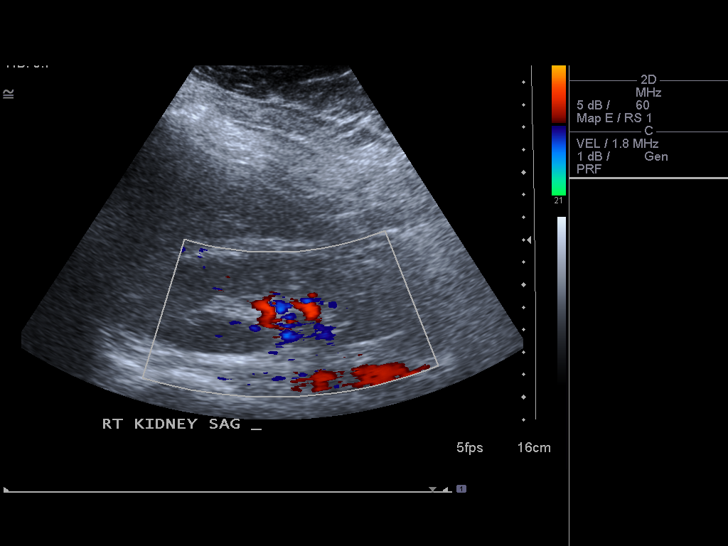
[im 66/79]
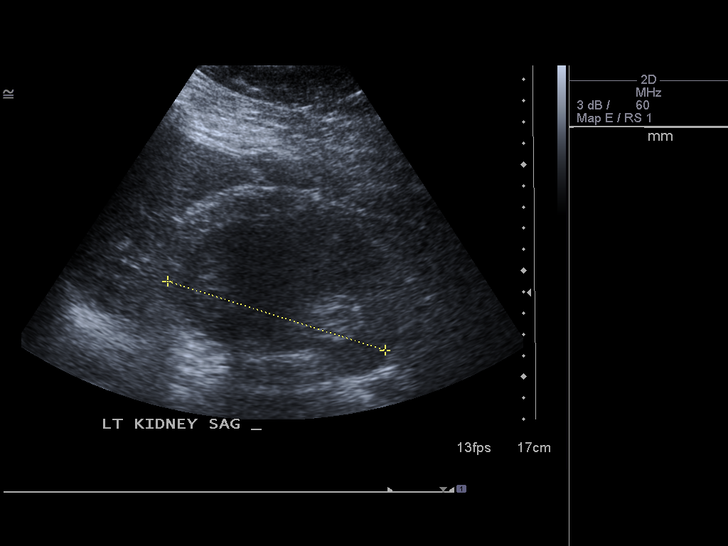
[im 72/79]
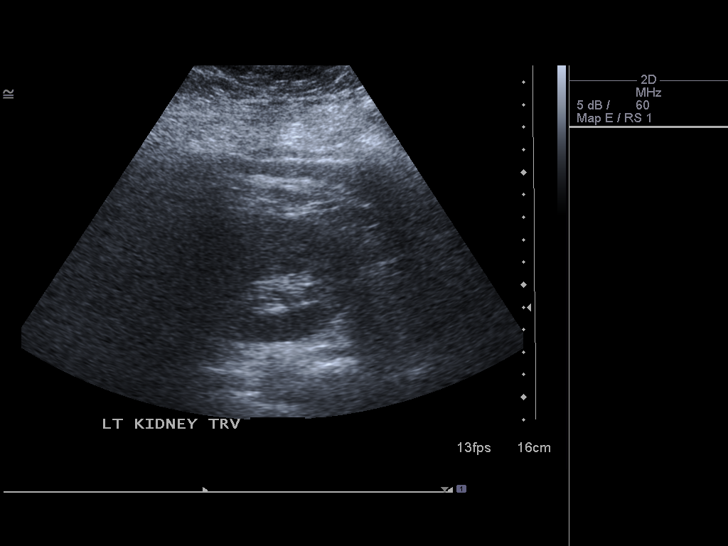
[im 79/79]
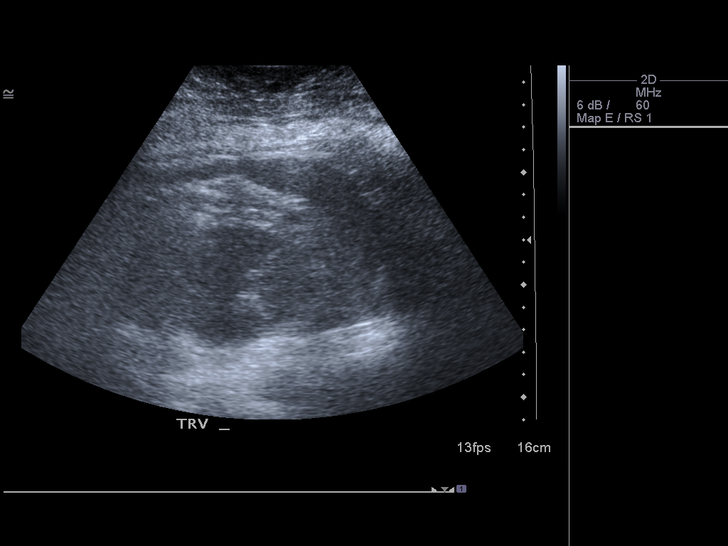

[13 of 25 positions shown; findings below may reference images not displayed]

FINDINGS: Gallbladder:  The gallbladder contains numerous echogenic foci,
some along the dependent wall, and some along the nondependent
wall.  These are favored to represent a combination of stones and
adenomyomatosis.  Gallbladder wall is otherwise 2.1 mm in
thickness.  No pericholecystic fluid.  No sonographic Murphy's
sign.

Common bile duct:  Common bile duct is 1.5 mm.

Liver:  No focal lesion identified.  Within normal limits in
parenchymal echogenicity.

IVC:  There is limited evaluation of the inferior vena cava because
of overlying bowel gas.

Pancreas:  Pancreas is not evaluated because of overlying bowel
gas.

Spleen:  The spleen has a normal appearance and measures 10.2 cm.

Right Kidney:  The right kidney has a normal appearance and
measures 11.7 cm.

Left Kidney:  The left kidney has a normal appearance and measures
10.8 cm.

Abdominal aorta:  The visualized abdominal aorta is not aneurysmal,
measuring 2.5 cm.
IMPRESSION: 1.  Multiple shadowing foci within the gallbladder, favored to
represent both stones and adenomyomatosis.
2.  No other evidence for acute cholecystitis.
2.  No evidence for hydronephrosis.
3.  Portions of the upper abdomen are not well visualized because
of overlying bowel gas.

## 2011-01-31 ENCOUNTER — Ambulatory Visit (INDEPENDENT_AMBULATORY_CARE_PROVIDER_SITE_OTHER): Payer: 59 | Admitting: Surgery

## 2011-01-31 ENCOUNTER — Encounter (INDEPENDENT_AMBULATORY_CARE_PROVIDER_SITE_OTHER): Payer: Self-pay | Admitting: Surgery

## 2011-01-31 ENCOUNTER — Encounter (HOSPITAL_COMMUNITY): Payer: Self-pay

## 2011-01-31 VITALS — BP 116/72 | HR 68 | Temp 97.4°F | Resp 18 | Ht 65.0 in | Wt 300.0 lb

## 2011-01-31 DIAGNOSIS — K8020 Calculus of gallbladder without cholecystitis without obstruction: Secondary | ICD-10-CM

## 2011-01-31 NOTE — Progress Notes (Signed)
Patient ID: Miranda Padilla, female   DOB: 12/23/1965, 45 y.o.   MRN: [id number]  Chief Complaint  Patient presents with  . Other    Eval gallbladder    HPI Miranda Padilla is a 45 y.o. female.   HPI This is a very pleasant female referred by Dr. Bednar for evaluation of right upper quadrant abdominal pain. Since November, she has been having right upper quadrant abdominal pain radiated to her back and shoulder. She has also had nausea and vomiting. She also has reflux. She denies any jaundice. She denies any fevers. The pain is sharp and moderate in intensity. Bowel movements are normal Past Medical History  Diagnosis Date  . GERD (gastroesophageal reflux disease)   . Anemia   . Cough   . Abdominal distension   . Abdominal pain   . Constipation   . Nausea & vomiting     Past Surgical History  Procedure Date  . Mouth surgery 2000    Family History  Problem Relation Age of Onset  . Thrombocytopenia Mother   . Cancer Maternal Grandmother     breast    Social History History  Substance Use Topics  . Smoking status: Never Smoker   . Smokeless tobacco: Never Used  . Alcohol Use: Yes     rare once or twice per year    Allergies  Allergen Reactions  . Sulfonamide Derivatives Itching and Rash    All over the body.    Current Outpatient Prescriptions  Medication Sig Dispense Refill  . omeprazole (PRILOSEC) 20 MG capsule Take 1 capsule (20 mg total) by mouth daily.  5 capsule  0  . ranitidine (ZANTAC) 150 MG tablet Take 150 mg by mouth 2 (two) times daily.          Review of Systems Review of Systems  Constitutional: Negative for fever, chills and unexpected weight change.  HENT: Negative for hearing loss, congestion, sore throat, trouble swallowing and voice change.   Eyes: Negative for visual disturbance.  Respiratory: Negative for cough and wheezing.   Cardiovascular: Negative for chest pain, palpitations and leg swelling.  Gastrointestinal: Positive for  nausea, vomiting, abdominal pain and abdominal distention. Negative for diarrhea, constipation, blood in stool and anal bleeding.  Genitourinary: Negative for hematuria, vaginal bleeding and difficulty urinating.  Musculoskeletal: Negative for arthralgias.  Skin: Negative for rash and wound.  Neurological: Negative for seizures, syncope and headaches.  Hematological: Negative for adenopathy. Does not bruise/bleed easily.  Psychiatric/Behavioral: Negative for confusion.    Blood pressure 116/72, pulse 68, temperature 97.4 F (36.3 C), temperature source Temporal, resp. rate 18, height 5' 5" (1.651 m), weight 300 lb (136.079 kg).  Physical Exam Physical Exam  Constitutional: She is oriented to person, place, and time. She appears well-developed and well-nourished. No distress.  HENT:  Head: Normocephalic and atraumatic.  Right Ear: External ear normal.  Left Ear: External ear normal.  Nose: Nose normal.  Mouth/Throat: Oropharynx is clear and moist. No oropharyngeal exudate.  Eyes: Conjunctivae are normal. Pupils are equal, round, and reactive to light. No scleral icterus.  Neck: Normal range of motion. Neck supple. No tracheal deviation present. No thyromegaly present.  Cardiovascular: Normal rate, regular rhythm, normal heart sounds and intact distal pulses.   No murmur heard. Pulmonary/Chest: Effort normal and breath sounds normal. No respiratory distress. She has no wheezes.  Abdominal: Soft. Bowel sounds are normal. There is tenderness. There is guarding.       She has mild tenderness and guarding   in the right upper quadrant  Musculoskeletal: Normal range of motion. She exhibits no edema and no tenderness.  Lymphadenopathy:    She has no cervical adenopathy.  Neurological: She is alert and oriented to person, place, and time.  Skin: Skin is warm and dry. No rash noted. No erythema.  Psychiatric: Her behavior is normal. Judgment normal.    Data Reviewed I had the patient's  ultrasound showing cholelithiasis. The bile duct is normal. There is no evidence of cholecystitis  Assessment    Patient with symptomatic cholelithiasis    Plan    Laparoscopic cholecystectomy with possible cholangiogram is recommended. I gave her literature regarding this. I discussed the surgery with her. I discussed the risks which include, but are not limited to, bleeding, infection, bile duct injury, bile leak, injury to surrounding structures, need to convert to an open procedure, DVT formation, etc. She understands and wishes to proceed. Likelihood of success is good.       Samariyah Cowles A 01/31/2011, 9:28 AM    

## 2011-02-01 ENCOUNTER — Encounter (HOSPITAL_COMMUNITY): Payer: Self-pay

## 2011-02-02 MED ORDER — CEFAZOLIN SODIUM-DEXTROSE 2-3 GM-% IV SOLR
2.0000 g | INTRAVENOUS | Status: AC
  Administered 2011-02-03: 2 g via INTRAVENOUS
  Filled 2011-02-02: qty 50

## 2011-02-03 ENCOUNTER — Encounter (HOSPITAL_COMMUNITY): Payer: Self-pay | Admitting: Certified Registered"

## 2011-02-03 ENCOUNTER — Ambulatory Visit (HOSPITAL_COMMUNITY): Payer: 59 | Admitting: Certified Registered"

## 2011-02-03 ENCOUNTER — Encounter (HOSPITAL_COMMUNITY): Payer: Self-pay | Admitting: *Deleted

## 2011-02-03 ENCOUNTER — Encounter (HOSPITAL_COMMUNITY)
Admission: RE | Disposition: A | Discharge status: Home / Self Care | Payer: Self-pay | Source: Ambulatory Visit | Attending: Surgery

## 2011-02-03 ENCOUNTER — Ambulatory Visit (HOSPITAL_COMMUNITY)
Admission: RE | Admit: 2011-02-03 | Discharge: 2011-02-03 | Disposition: A | Discharge status: Home / Self Care | Payer: 59 | Source: Ambulatory Visit | Attending: Surgery | Admitting: Surgery

## 2011-02-03 ENCOUNTER — Other Ambulatory Visit (INDEPENDENT_AMBULATORY_CARE_PROVIDER_SITE_OTHER): Payer: Self-pay | Admitting: Surgery

## 2011-02-03 DIAGNOSIS — K8010 Calculus of gallbladder with chronic cholecystitis without obstruction: Secondary | ICD-10-CM

## 2011-02-03 DIAGNOSIS — K219 Gastro-esophageal reflux disease without esophagitis: Secondary | ICD-10-CM | POA: Insufficient documentation

## 2011-02-03 DIAGNOSIS — R51 Headache: Secondary | ICD-10-CM | POA: Insufficient documentation

## 2011-02-03 DIAGNOSIS — J45909 Unspecified asthma, uncomplicated: Secondary | ICD-10-CM | POA: Insufficient documentation

## 2011-02-03 DIAGNOSIS — K8020 Calculus of gallbladder without cholecystitis without obstruction: Secondary | ICD-10-CM | POA: Insufficient documentation

## 2011-02-03 HISTORY — DX: Unspecified osteoarthritis, unspecified site: M19.90

## 2011-02-03 HISTORY — DX: Bronchitis, not specified as acute or chronic: J40

## 2011-02-03 HISTORY — DX: Anxiety disorder, unspecified: F41.9

## 2011-02-03 HISTORY — DX: Obsessive-compulsive disorder, unspecified: F42.9

## 2011-02-03 HISTORY — DX: Headache: R51

## 2011-02-03 HISTORY — PX: CHOLECYSTECTOMY: SHX55

## 2011-02-03 LAB — CBC
HCT: 35.2 % — ABNORMAL LOW (ref 36.0–46.0)
Hemoglobin: 11.6 g/dL — ABNORMAL LOW (ref 12.0–15.0)
MCH: 24.8 pg — ABNORMAL LOW (ref 26.0–34.0)
MCHC: 33 g/dL (ref 30.0–36.0)
MCV: 75.2 fL — ABNORMAL LOW (ref 78.0–100.0)
Platelets: 308 10*3/uL (ref 150–400)
RBC: 4.68 MIL/uL (ref 3.87–5.11)
RDW: 15.6 % — ABNORMAL HIGH (ref 11.5–15.5)
WBC: 3.3 10*3/uL — ABNORMAL LOW (ref 4.0–10.5)

## 2011-02-03 LAB — COMPREHENSIVE METABOLIC PANEL
ALT: 11 U/L (ref 0–35)
AST: 13 U/L (ref 0–37)
Albumin: 3.4 g/dL — ABNORMAL LOW (ref 3.5–5.2)
Alkaline Phosphatase: 55 U/L (ref 39–117)
BUN: 8 mg/dL (ref 6–23)
CO2: 22 mEq/L (ref 19–32)
Calcium: 9 mg/dL (ref 8.4–10.5)
Chloride: 107 mEq/L (ref 96–112)
Creatinine, Ser: 0.67 mg/dL (ref 0.50–1.10)
GFR calc Af Amer: >90 mL/min (ref >90)
GFR calc non Af Amer: >90 mL/min (ref >90)
Glucose, Bld: 112 mg/dL — ABNORMAL HIGH (ref 70–99)
Potassium: 3.7 mEq/L (ref 3.5–5.1)
Sodium: 141 mEq/L (ref 135–145)
Total Bilirubin: 0.6 mg/dL (ref 0.3–1.2)
Total Protein: 7.1 g/dL (ref 6.0–8.3)

## 2011-02-03 LAB — SURGICAL PCR SCREEN
MRSA, PCR: NEGATIVE
Staphylococcus aureus: POSITIVE — AB

## 2011-02-03 LAB — HCG, SERUM, QUALITATIVE: Preg, Serum: NEGATIVE

## 2011-02-03 SURGERY — LAPAROSCOPIC CHOLECYSTECTOMY
Anesthesia: General | Site: Abdomen | Wound class: Contaminated

## 2011-02-03 MED ORDER — LACTATED RINGERS IV SOLN
INTRAVENOUS | Status: DC
  Administered 2011-02-03: 09:00:00 via INTRAVENOUS

## 2011-02-03 MED ORDER — LACTATED RINGERS IV SOLN
INTRAVENOUS | Status: DC | PRN
  Administered 2011-02-03 (×2): via INTRAVENOUS

## 2011-02-03 MED ORDER — NEOSTIGMINE METHYLSULFATE 1 MG/ML IJ SOLN
INTRAMUSCULAR | Status: DC | PRN
  Administered 2011-02-03: 4 mg via INTRAVENOUS

## 2011-02-03 MED ORDER — GLYCOPYRROLATE 0.2 MG/ML IJ SOLN
INTRAMUSCULAR | Status: DC | PRN
  Administered 2011-02-03: .4 mg via INTRAVENOUS

## 2011-02-03 MED ORDER — KETOROLAC TROMETHAMINE 30 MG/ML IJ SOLN
INTRAMUSCULAR | Status: DC | PRN
  Administered 2011-02-03: 30 mg via INTRAVENOUS

## 2011-02-03 MED ORDER — MIDAZOLAM HCL 5 MG/5ML IJ SOLN
INTRAMUSCULAR | Status: DC | PRN
  Administered 2011-02-03: 2 mg via INTRAVENOUS

## 2011-02-03 MED ORDER — HYDROCODONE-ACETAMINOPHEN 5-325 MG PO TABS: 1.0000 | ORAL_TABLET | ORAL | Status: AC | PRN

## 2011-02-03 MED ORDER — MUPIROCIN 2 % EX OINT
TOPICAL_OINTMENT | CUTANEOUS | Status: AC
  Administered 2011-02-03: 1 via NASAL
  Filled 2011-02-03: qty 22

## 2011-02-03 MED ORDER — SUCCINYLCHOLINE CHLORIDE 20 MG/ML IJ SOLN
INTRAMUSCULAR | Status: DC | PRN
  Administered 2011-02-03: 100 mg via INTRAVENOUS

## 2011-02-03 MED ORDER — ROCURONIUM BROMIDE 100 MG/10ML IV SOLN
INTRAVENOUS | Status: DC | PRN
  Administered 2011-02-03: 5 mg via INTRAVENOUS
  Administered 2011-02-03: 20 mg via INTRAVENOUS

## 2011-02-03 MED ORDER — PROPOFOL 10 MG/ML IV EMUL
INTRAVENOUS | Status: DC | PRN
  Administered 2011-02-03: 50 mg via INTRAVENOUS
  Administered 2011-02-03: 200 mg via INTRAVENOUS

## 2011-02-03 MED ORDER — SODIUM CHLORIDE 0.9 % IR SOLN
Status: DC | PRN
  Administered 2011-02-03: 1000 mL

## 2011-02-03 MED ORDER — BUPIVACAINE-EPINEPHRINE 0.25% -1:200000 IJ SOLN
INTRAMUSCULAR | Status: DC | PRN
  Administered 2011-02-03: 20 mL

## 2011-02-03 MED ORDER — PROMETHAZINE HCL 25 MG/ML IJ SOLN: 6.2500 mg | INTRAMUSCULAR | Status: DC | PRN

## 2011-02-03 MED ORDER — FENTANYL CITRATE 0.05 MG/ML IJ SOLN
INTRAMUSCULAR | Status: DC | PRN
  Administered 2011-02-03: 50 ug via INTRAVENOUS
  Administered 2011-02-03: 150 ug via INTRAVENOUS
  Administered 2011-02-03: 50 ug via INTRAVENOUS

## 2011-02-03 MED ORDER — HYDROMORPHONE HCL PF 1 MG/ML IJ SOLN: 0.2500 mg | INTRAMUSCULAR | Status: DC | PRN

## 2011-02-03 MED ORDER — ONDANSETRON HCL 4 MG/2ML IJ SOLN
INTRAMUSCULAR | Status: DC | PRN
  Administered 2011-02-03: 4 mg via INTRAVENOUS

## 2011-02-03 MED ORDER — DEXAMETHASONE SODIUM PHOSPHATE 4 MG/ML IJ SOLN
INTRAMUSCULAR | Status: DC | PRN
  Administered 2011-02-03: 8 mg via INTRAVENOUS

## 2011-02-03 MED ORDER — 0.9 % SODIUM CHLORIDE (POUR BTL) OPTIME
TOPICAL | Status: DC | PRN
  Administered 2011-02-03: 1000 mL

## 2011-02-03 SURGICAL SUPPLY — 42 items
APL SKNCLS STERI-STRIP NONHPOA (GAUZE/BANDAGES/DRESSINGS) ×1
APPLIER CLIP 5 13 M/L LIGAMAX5 (MISCELLANEOUS) ×2
APR CLP MED LRG 5 ANG JAW (MISCELLANEOUS) ×1
BAG SPEC RTRVL LRG 6X4 10 (ENDOMECHANICALS)
BANDAGE ADHESIVE 1X3 (GAUZE/BANDAGES/DRESSINGS) ×8 IMPLANT
BANDAGE ELASTIC 3 VELCRO NS (GAUZE/BANDAGES/DRESSINGS) ×4 IMPLANT
BENZOIN TINCTURE PRP APPL 2/3 (GAUZE/BANDAGES/DRESSINGS) ×2 IMPLANT
CANISTER SUCTION 2500CC (MISCELLANEOUS) ×2 IMPLANT
CHLORAPREP W/TINT 26ML (MISCELLANEOUS) ×2 IMPLANT
CLIP APPLIE 5 13 M/L LIGAMAX5 (MISCELLANEOUS) ×1 IMPLANT
CLOTH BEACON ORANGE TIMEOUT ST (SAFETY) ×2 IMPLANT
COVER MAYO STAND STRL (DRAPES) IMPLANT
COVER SURGICAL LIGHT HANDLE (MISCELLANEOUS) ×2 IMPLANT
DECANTER SPIKE VIAL GLASS SM (MISCELLANEOUS) ×2 IMPLANT
DRAPE C-ARM 42X72 X-RAY (DRAPES) IMPLANT
ELECT REM PT RETURN 9FT ADLT (ELECTROSURGICAL) ×2
ELECTRODE REM PT RTRN 9FT ADLT (ELECTROSURGICAL) ×1 IMPLANT
GLOVE BIO SURGEON STRL SZ7 (GLOVE) ×1 IMPLANT
GLOVE BIOGEL PI IND STRL 7.0 (GLOVE) IMPLANT
GLOVE BIOGEL PI INDICATOR 7.0 (GLOVE) ×3
GLOVE SURG SIGNA 7.5 PF LTX (GLOVE) ×2 IMPLANT
GLOVE SURG SS PI 6.5 STRL IVOR (GLOVE) ×2 IMPLANT
GOWN PREVENTION PLUS XLARGE (GOWN DISPOSABLE) ×2 IMPLANT
GOWN STRL NON-REIN LRG LVL3 (GOWN DISPOSABLE) ×5 IMPLANT
KIT BASIN OR (CUSTOM PROCEDURE TRAY) ×2 IMPLANT
KIT ROOM TURNOVER OR (KITS) ×2 IMPLANT
NS IRRIG 1000ML POUR BTL (IV SOLUTION) ×2 IMPLANT
PAD ARMBOARD 7.5X6 YLW CONV (MISCELLANEOUS) ×4 IMPLANT
POUCH SPECIMEN RETRIEVAL 10MM (ENDOMECHANICALS) IMPLANT
SCISSORS LAP 5X35 DISP (ENDOMECHANICALS) IMPLANT
SET CHOLANGIOGRAPH 5 50 .035 (SET/KITS/TRAYS/PACK) IMPLANT
SET IRRIG TUBING LAPAROSCOPIC (IRRIGATION / IRRIGATOR) ×2 IMPLANT
SLEEVE ENDOPATH XCEL 5M (ENDOMECHANICALS) ×4 IMPLANT
SPECIMEN JAR SMALL (MISCELLANEOUS) ×2 IMPLANT
STRIP CLOSURE SKIN 1/2X4 (GAUZE/BANDAGES/DRESSINGS) ×1 IMPLANT
SUT MON AB 4-0 PC3 18 (SUTURE) ×2 IMPLANT
TOWEL OR 17X24 6PK STRL BLUE (TOWEL DISPOSABLE) ×2 IMPLANT
TOWEL OR 17X26 10 PK STRL BLUE (TOWEL DISPOSABLE) ×2 IMPLANT
TRAY LAPAROSCOPIC (CUSTOM PROCEDURE TRAY) ×2 IMPLANT
TROCAR XCEL BLUNT TIP 100MML (ENDOMECHANICALS) ×2 IMPLANT
TROCAR XCEL NON-BLD 5MMX100MML (ENDOMECHANICALS) ×2 IMPLANT
WATER STERILE IRR 1000ML POUR (IV SOLUTION) IMPLANT

## 2011-02-03 NOTE — Addendum Note (Signed)
Addendum  created 02/03/11 1337 by Roc Streett Mumm   Modules edited:Anesthesia Flowsheet    

## 2011-02-03 NOTE — Addendum Note (Signed)
Addendum  created 02/03/11 1337 by Einar Crow   Modules edited:Anesthesia Flowsheet

## 2011-02-03 NOTE — Preoperative (Signed)
Beta Blockers   Reason not to administer Beta Blockers:Not Applicable 

## 2011-02-03 NOTE — Op Note (Signed)
Laparoscopic Cholecystectomy Procedure Note  Indications: This patient presents with symptomatic gallbladder disease and will undergo laparoscopic cholecystectomy.  Pre-operative Diagnosis: Calculus of gallbladder without mention of cholecystitis or obstruction  Post-operative Diagnosis: Same  Surgeon: Abigail Miyamoto A   Assistants: 0  Anesthesia: General endotracheal anesthesia  ASA Class: 3  Procedure Details  The patient was seen again in the Holding Room. The risks, benefits, complications, treatment options, and expected outcomes were discussed with the patient. The possibilities of reaction to medication, pulmonary aspiration, perforation of viscus, bleeding, recurrent infection, finding a normal gallbladder, the need for additional procedures, failure to diagnose a condition, the possible need to convert to an open procedure, and creating a complication requiring transfusion or operation were discussed with the patient. The likelihood of improving the patient's symptoms with return to their baseline status is good.  The patient and/or family concurred with the proposed plan, giving informed consent. The site of surgery properly noted. The patient was taken to Operating Room, identified as Miranda Padilla and the procedure verified as Laparoscopic Cholecystectomy with Intraoperative Cholangiogram. A Time Out was held and the above information confirmed.  Prior to the induction of general anesthesia, antibiotic prophylaxis was administered. General endotracheal anesthesia was then administered and tolerated well. After the induction, the abdomen was prepped with Chloraprep and draped in sterile fashion. The patient was positioned in the supine position.  Local anesthetic agent was injected into the skin near the umbilicus and an incision made. We dissected down to the abdominal fascia with blunt dissection.  The fascia was incised vertically and we entered the peritoneal cavity  bluntly.  A pursestring suture of 0-Vicryl was placed around the fascial opening.  The Hasson cannula was inserted and secured with the stay suture.  Pneumoperitoneum was then created with CO2 and tolerated well without any adverse changes in the patient's vital signs. An 11-mm port was placed in the subxiphoid position.  Two 5-mm ports were placed in the right upper quadrant. All skin incisions were infiltrated with a local anesthetic agent before making the incision and placing the trocars.   We positioned the patient in reverse Trendelenburg, tilted slightly to the patient's left.  The gallbladder was identified, the fundus grasped and retracted cephalad. Adhesions were lysed bluntly and with the electrocautery where indicated, taking care not to injure any adjacent organs or viscus. The infundibulum was grasped and retracted laterally, exposing the peritoneum overlying the triangle of Calot. This was then divided and exposed in a blunt fashion. The cystic duct was clearly identified and bluntly dissected circumferentially. A critical view of the cystic duct and cystic artery was obtained.  The cystic duct was then ligated with clips and divided. The cystic artery was, dissected free, ligated with clips and divided as well.   The gallbladder was dissected from the liver bed in retrograde fashion with the electrocautery. The gallbladder was removed and placed in an Endocatch sac. The liver bed was irrigated and inspected. Hemostasis was achieved with the electrocautery. Copious irrigation was utilized and was repeatedly aspirated until clear.  The gallbladder and Endocatch sac were then removed through the umbilical port site.  The pursestring suture was used to close the umbilical fascia.    We again inspected the right upper quadrant for hemostasis.  Pneumoperitoneum was released as we removed the trocars.  4-0 Monocryl was used to close the skin.   Benzoin, steri-strips, and clean dressings were applied.  The patient was then extubated and brought to the recovery room in  stable condition. Instrument, sponge, and needle counts were correct at closure and at the conclusion of the case.   Findings: thickened gallbladder with stones Estimated Blood Loss: Minimal         Drains: 0         Specimens: Gallbladder           Complications: None; patient tolerated the procedure well.         Disposition: PACU - hemodynamically stable.         Condition: stable

## 2011-02-03 NOTE — Transfer of Care (Signed)
Immediate Anesthesia Transfer of Care Note  Patient: Miranda Padilla  Procedure(s) Performed:  LAPAROSCOPIC CHOLECYSTECTOMY - Laparoscopic Choleystectomy  Patient Location: PACU  Anesthesia Type: General  Level of Consciousness: awake, alert , oriented and patient cooperative  Airway & Oxygen Therapy: Patient Spontanous Breathing and Patient connected to face mask oxygen  Post-op Assessment: Report given to PACU RN, Post -op Vital signs reviewed and stable and Patient moving all extremities  Post vital signs: Reviewed and stable Filed Vitals:   02/03/11 0955  BP:   Pulse:   Temp: 37 C  Resp:     Complications: No apparent anesthesia complications

## 2011-02-03 NOTE — Anesthesia Postprocedure Evaluation (Signed)
  Anesthesia Post-op Note  Patient: Miranda Padilla  Procedure(s) Performed:  LAPAROSCOPIC CHOLECYSTECTOMY - Laparoscopic Choleystectomy  Patient Location: PACU  Anesthesia Type: General  Level of Consciousness: awake, alert  and oriented  Airway and Oxygen Therapy: Patient Spontanous Breathing  Post-op Pain: mild  Post-op Assessment: Post-op Vital signs reviewed, Patient's Cardiovascular Status Stable, Respiratory Function Stable, RESPIRATORY FUNCTION UNSTABLE, No signs of Nausea or vomiting and Pain level controlled  Post-op Vital Signs: Reviewed and stable  Complications: No apparent anesthesia complications

## 2011-02-03 NOTE — H&P (View-Only) (Signed)
Patient ID: Miranda Padilla, female   DOB: [birth year], 46 y.o.   MRN: [id number]  Chief Complaint  Patient presents with  . Other    Eval gallbladder    HPI Miranda Padilla is a 46 y.o. female.   HPI This is a very pleasant female referred by Dr. Fonnie Jarvis for evaluation of right upper quadrant abdominal pain. Since November, she has been having right upper quadrant abdominal pain radiated to her back and shoulder. She has also had nausea and vomiting. She also has reflux. She denies any jaundice. She denies any fevers. The pain is sharp and moderate in intensity. Bowel movements are normal Past Medical History  Diagnosis Date  . GERD (gastroesophageal reflux disease)   . Anemia   . Cough   . Abdominal distension   . Abdominal pain   . Constipation   . Nausea & vomiting     Past Surgical History  Procedure Date  . Mouth surgery 2000    Family History  Problem Relation Age of Onset  . Thrombocytopenia Mother   . Cancer Maternal Grandmother     breast    Social History History  Substance Use Topics  . Smoking status: Never Smoker   . Smokeless tobacco: Never Used  . Alcohol Use: Yes     rare once or twice per year    Allergies  Allergen Reactions  . Sulfonamide Derivatives Itching and Rash    All over the body.    Current Outpatient Prescriptions  Medication Sig Dispense Refill  . omeprazole (PRILOSEC) 20 MG capsule Take 1 capsule (20 mg total) by mouth daily.  5 capsule  0  . ranitidine (ZANTAC) 150 MG tablet Take 150 mg by mouth 2 (two) times daily.          Review of Systems Review of Systems  Constitutional: Negative for fever, chills and unexpected weight change.  HENT: Negative for hearing loss, congestion, sore throat, trouble swallowing and voice change.   Eyes: Negative for visual disturbance.  Respiratory: Negative for cough and wheezing.   Cardiovascular: Negative for chest pain, palpitations and leg swelling.  Gastrointestinal: Positive for  nausea, vomiting, abdominal pain and abdominal distention. Negative for diarrhea, constipation, blood in stool and anal bleeding.  Genitourinary: Negative for hematuria, vaginal bleeding and difficulty urinating.  Musculoskeletal: Negative for arthralgias.  Skin: Negative for rash and wound.  Neurological: Negative for seizures, syncope and headaches.  Hematological: Negative for adenopathy. Does not bruise/bleed easily.  Psychiatric/Behavioral: Negative for confusion.    Blood pressure 116/72, pulse 68, temperature 97.4 F (36.3 C), temperature source Temporal, resp. rate 18, height 5\' 5"  (1.651 m), weight 300 lb (136.079 kg).  Physical Exam Physical Exam  Constitutional: She is oriented to person, place, and time. She appears well-developed and well-nourished. No distress.  HENT:  Head: Normocephalic and atraumatic.  Right Ear: External ear normal.  Left Ear: External ear normal.  Nose: Nose normal.  Mouth/Throat: Oropharynx is clear and moist. No oropharyngeal exudate.  Eyes: Conjunctivae are normal. Pupils are equal, round, and reactive to light. No scleral icterus.  Neck: Normal range of motion. Neck supple. No tracheal deviation present. No thyromegaly present.  Cardiovascular: Normal rate, regular rhythm, normal heart sounds and intact distal pulses.   No murmur heard. Pulmonary/Chest: Effort normal and breath sounds normal. No respiratory distress. She has no wheezes.  Abdominal: Soft. Bowel sounds are normal. There is tenderness. There is guarding.       She has mild tenderness and guarding  in the right upper quadrant  Musculoskeletal: Normal range of motion. She exhibits no edema and no tenderness.  Lymphadenopathy:    She has no cervical adenopathy.  Neurological: She is alert and oriented to person, place, and time.  Skin: Skin is warm and dry. No rash noted. No erythema.  Psychiatric: Her behavior is normal. Judgment normal.    Data Reviewed I had the patient's  ultrasound showing cholelithiasis. The bile duct is normal. There is no evidence of cholecystitis  Assessment    Patient with symptomatic cholelithiasis    Plan    Laparoscopic cholecystectomy with possible cholangiogram is recommended. I gave her literature regarding this. I discussed the surgery with her. I discussed the risks which include, but are not limited to, bleeding, infection, bile duct injury, bile leak, injury to surrounding structures, need to convert to an open procedure, DVT formation, etc. She understands and wishes to proceed. Likelihood of success is good.       Delcie Ruppert A 01/31/2011, 9:28 AM

## 2011-02-03 NOTE — Anesthesia Preprocedure Evaluation (Addendum)
Anesthesia Evaluation  Patient identified by MRN, date of birth, ID band Patient awake    Reviewed: Allergy & Precautions, H&P , NPO status , Patient's Chart, lab work & pertinent test results, reviewed documented beta blocker date and time   Airway Mallampati: II TM Distance: >3 FB Neck ROM: Full    Dental No notable dental hx. (+) Teeth Intact and Dental Advisory Given   Pulmonary asthma (has not needed inhaler in many months) , Recent URI , Resolved,  clear to auscultation  Pulmonary exam normal       Cardiovascular neg cardio ROS Regular Normal- Friction Rub    Neuro/Psych  Headaches,    GI/Hepatic Neg liver ROS, GERD-  Controlled and Medicated,Patient did not received Oral Contrast Agents,  Endo/Other  Morbid obesity  Renal/GU negative Renal ROS     Musculoskeletal   Abdominal (+) obese,   Peds  Hematology Sickle trait, has never had a crisis, not anemic   Anesthesia Other Findings   Reproductive/Obstetrics LMP presently                          Anesthesia Physical Anesthesia Plan  ASA: III  Anesthesia Plan: General   Post-op Pain Management:    Induction: Intravenous  Airway Management Planned: Oral ETT  Additional Equipment:   Intra-op Plan:   Post-operative Plan: Extubation in OR  Informed Consent: I have reviewed the patients History and Physical, chart, labs and discussed the procedure including the risks, benefits and alternatives for the proposed anesthesia with the patient or authorized representative who has indicated his/her understanding and acceptance.   Dental advisory given  Plan Discussed with: Surgeon and CRNA  Anesthesia Plan Comments: (Plan routine monitors, GETA)        Anesthesia Quick Evaluation

## 2011-02-03 NOTE — Interval H&P Note (Signed)
History and Physical Interval Note: she has had no changes since I saw her  02/03/2011 5:02 AM  Miranda Padilla  has presented today for surgery, with the diagnosis of gallstones  The various methods of treatment have been discussed with the patient and family. After consideration of risks, benefits and other options for treatment, the patient has consented to  Procedure(s): LAPAROSCOPIC CHOLECYSTECTOMY as a surgical intervention .  The patients' history has been reviewed, patient examined, no change in status, stable for surgery.  I have reviewed the patients' chart and labs.  Questions were answered to the patient's satisfaction.     Taiden Raybourn A

## 2011-02-06 ENCOUNTER — Encounter (HOSPITAL_COMMUNITY): Payer: Self-pay | Admitting: Surgery

## 2011-02-21 ENCOUNTER — Encounter (INDEPENDENT_AMBULATORY_CARE_PROVIDER_SITE_OTHER): Payer: Self-pay | Admitting: Surgery

## 2011-02-21 ENCOUNTER — Ambulatory Visit (INDEPENDENT_AMBULATORY_CARE_PROVIDER_SITE_OTHER): Payer: 59 | Admitting: Surgery

## 2011-02-21 VITALS — BP 110/80 | HR 102 | Temp 97.6°F | Resp 12 | Wt 294.0 lb

## 2011-02-21 DIAGNOSIS — Z09 Encounter for follow-up examination after completed treatment for conditions other than malignant neoplasm: Secondary | ICD-10-CM

## 2011-02-21 NOTE — Progress Notes (Signed)
Subjective:     Patient ID: Miranda Padilla, female   DOB: [birth year], 46 y.o.   MRN: [id number]  HPI She is here for her first postoperative visit status post laparoscopic cholecystectomy. She is doing moderately well. She has just slight discomfort and gas  Review of Systems     Objective:   Physical Exam On exam, her incisions are well healed. There is no evidence of hernia. Her path showed chronic cholecystitis with gallstones    Assessment:     Patient doing well status post laparoscopic cholecystectomy    Plan:     She may return to work and normal activities on February 24, 2011. I will see her back as needed

## 2013-02-11 ENCOUNTER — Other Ambulatory Visit (HOSPITAL_COMMUNITY)
Admission: RE | Admit: 2013-02-11 | Discharge: 2013-02-11 | Disposition: A | Discharge status: Home / Self Care | Payer: 59 | Source: Ambulatory Visit | Attending: Family Medicine | Admitting: Family Medicine

## 2013-02-11 DIAGNOSIS — Z0000 Encounter for general adult medical examination without abnormal findings: Secondary | ICD-10-CM | POA: Insufficient documentation

## 2016-05-04 ENCOUNTER — Other Ambulatory Visit (HOSPITAL_COMMUNITY)
Admission: RE | Admit: 2016-05-04 | Discharge: 2016-05-04 | Disposition: A | Discharge status: Home / Self Care | Payer: 59 | Source: Ambulatory Visit | Attending: Family Medicine | Admitting: Family Medicine

## 2016-05-04 ENCOUNTER — Other Ambulatory Visit: Payer: Self-pay | Admitting: Family Medicine

## 2016-05-04 DIAGNOSIS — Z0000 Encounter for general adult medical examination without abnormal findings: Secondary | ICD-10-CM | POA: Diagnosis not present

## 2016-05-04 DIAGNOSIS — E782 Mixed hyperlipidemia: Secondary | ICD-10-CM | POA: Diagnosis not present

## 2016-05-04 DIAGNOSIS — Z01419 Encounter for gynecological examination (general) (routine) without abnormal findings: Secondary | ICD-10-CM | POA: Diagnosis not present

## 2016-05-08 LAB — CYTOLOGY - PAP: Diagnosis: NEGATIVE

## 2016-05-16 ENCOUNTER — Other Ambulatory Visit: Payer: Self-pay | Admitting: Gastroenterology

## 2016-05-16 DIAGNOSIS — Z1211 Encounter for screening for malignant neoplasm of colon: Secondary | ICD-10-CM | POA: Diagnosis not present

## 2016-05-31 ENCOUNTER — Encounter (HOSPITAL_COMMUNITY): Payer: Self-pay | Admitting: *Deleted

## 2016-05-31 NOTE — Progress Notes (Signed)
Pt denies SOB, chest pain, and being under the care of a cardiologist. Pt denies having a stress test, echo and cardiac cath. Pt denies having an EKG and chest x ray within the last year. Pt denies having recent labs. Pt made aware to stop taking  Aspirin, vitamins, fish oil and herbal medications. Do not take any NSAIDs ie: Ibuprofen, Advil, Naproxen, BC and Goody Powder or any medication containing Aspirin. Pt verbalized understanding of all pre-op instructions.

## 2016-06-01 ENCOUNTER — Encounter (HOSPITAL_COMMUNITY)
Admission: RE | Disposition: A | Discharge status: Home / Self Care | Payer: Self-pay | Source: Ambulatory Visit | Attending: Gastroenterology

## 2016-06-01 ENCOUNTER — Ambulatory Visit (HOSPITAL_COMMUNITY): Payer: 59 | Admitting: Anesthesiology

## 2016-06-01 ENCOUNTER — Encounter (HOSPITAL_COMMUNITY): Payer: Self-pay | Admitting: *Deleted

## 2016-06-01 ENCOUNTER — Ambulatory Visit (HOSPITAL_COMMUNITY)
Admission: RE | Admit: 2016-06-01 | Discharge: 2016-06-01 | Disposition: A | Discharge status: Home / Self Care | Payer: 59 | Source: Ambulatory Visit | Attending: Gastroenterology | Admitting: Gastroenterology

## 2016-06-01 DIAGNOSIS — R5382 Chronic fatigue, unspecified: Secondary | ICD-10-CM | POA: Insufficient documentation

## 2016-06-01 DIAGNOSIS — D127 Benign neoplasm of rectosigmoid junction: Secondary | ICD-10-CM | POA: Diagnosis not present

## 2016-06-01 DIAGNOSIS — F419 Anxiety disorder, unspecified: Secondary | ICD-10-CM | POA: Insufficient documentation

## 2016-06-01 DIAGNOSIS — J45909 Unspecified asthma, uncomplicated: Secondary | ICD-10-CM | POA: Insufficient documentation

## 2016-06-01 DIAGNOSIS — Z833 Family history of diabetes mellitus: Secondary | ICD-10-CM | POA: Diagnosis not present

## 2016-06-01 DIAGNOSIS — D122 Benign neoplasm of ascending colon: Secondary | ICD-10-CM | POA: Insufficient documentation

## 2016-06-01 DIAGNOSIS — Z882 Allergy status to sulfonamides status: Secondary | ICD-10-CM | POA: Diagnosis not present

## 2016-06-01 DIAGNOSIS — D649 Anemia, unspecified: Secondary | ICD-10-CM | POA: Insufficient documentation

## 2016-06-01 DIAGNOSIS — F429 Obsessive-compulsive disorder, unspecified: Secondary | ICD-10-CM | POA: Insufficient documentation

## 2016-06-01 DIAGNOSIS — D124 Benign neoplasm of descending colon: Secondary | ICD-10-CM | POA: Diagnosis not present

## 2016-06-01 DIAGNOSIS — D573 Sickle-cell trait: Secondary | ICD-10-CM | POA: Insufficient documentation

## 2016-06-01 DIAGNOSIS — Z1211 Encounter for screening for malignant neoplasm of colon: Secondary | ICD-10-CM | POA: Diagnosis not present

## 2016-06-01 DIAGNOSIS — Z6841 Body Mass Index (BMI) 40.0 and over, adult: Secondary | ICD-10-CM | POA: Diagnosis not present

## 2016-06-01 DIAGNOSIS — F329 Major depressive disorder, single episode, unspecified: Secondary | ICD-10-CM | POA: Insufficient documentation

## 2016-06-01 DIAGNOSIS — Z8249 Family history of ischemic heart disease and other diseases of the circulatory system: Secondary | ICD-10-CM | POA: Insufficient documentation

## 2016-06-01 DIAGNOSIS — K219 Gastro-esophageal reflux disease without esophagitis: Secondary | ICD-10-CM | POA: Diagnosis not present

## 2016-06-01 HISTORY — DX: Depression, unspecified: F32.A

## 2016-06-01 HISTORY — DX: Family history of other specified conditions: Z84.89

## 2016-06-01 HISTORY — PX: COLONOSCOPY WITH PROPOFOL: SHX5780

## 2016-06-01 HISTORY — DX: Major depressive disorder, single episode, unspecified: F32.9

## 2016-06-01 SURGERY — COLONOSCOPY WITH PROPOFOL
Anesthesia: Monitor Anesthesia Care

## 2016-06-01 MED ORDER — SODIUM CHLORIDE 0.9 % IV SOLN: INTRAVENOUS | Status: DC

## 2016-06-01 MED ORDER — PROPOFOL 10 MG/ML IV BOLUS
INTRAVENOUS | Status: DC | PRN
  Administered 2016-06-01 (×2): 100 mg via INTRAVENOUS
  Administered 2016-06-01: 25 mg via INTRAVENOUS

## 2016-06-01 MED ORDER — LACTATED RINGERS IV SOLN
INTRAVENOUS | Status: DC
  Administered 2016-06-01 (×2): via INTRAVENOUS

## 2016-06-01 MED ORDER — PROPOFOL 500 MG/50ML IV EMUL
INTRAVENOUS | Status: DC | PRN
  Administered 2016-06-01: 100 ug/kg/min via INTRAVENOUS

## 2016-06-01 NOTE — Anesthesia Preprocedure Evaluation (Signed)
Anesthesia Evaluation  Patient identified by MRN, date of birth, ID band  Reviewed: Allergy & Precautions, NPO status , Patient's Chart, lab work & pertinent test results  Airway Mallampati: II       Dental no notable dental hx.    Pulmonary shortness of breath, asthma ,     + decreased breath sounds      Cardiovascular  Rhythm:Regular Rate:Normal     Neuro/Psych    GI/Hepatic GERD  ,  Endo/Other  Morbid obesity  Renal/GU      Musculoskeletal   Abdominal   Peds  Hematology  (+) anemia ,   Anesthesia Other Findings   Reproductive/Obstetrics                             Anesthesia Physical Anesthesia Plan  ASA: III  Anesthesia Plan: MAC   Post-op Pain Management:    Induction: Intravenous  Airway Management Planned: Natural Airway  Additional Equipment:   Intra-op Plan:   Post-operative Plan:   Informed Consent: I have reviewed the patients History and Physical, chart, labs and discussed the procedure including the risks, benefits and alternatives for the proposed anesthesia with the patient or authorized representative who has indicated his/her understanding and acceptance.     Plan Discussed with: CRNA  Anesthesia Plan Comments:         Anesthesia Quick Evaluation

## 2016-06-01 NOTE — Op Note (Signed)
The colonoscopy was uneventful. Because of patient's habitus, significant looping and restricted mobility of the colon the patient had to be kept in supine position in order to complete the colonoscopy and reach to the cecum. Patient tolerated the procedure well. A single polyp was identified in the ascending colon and 2 rectosigmoid polyps were identified, removed and sent to pathology. Patient needs to have interval colonoscopy depending upon the pathology of polyps.  Colonoscopy  Post procedure instructions:  Read the instructions outlined below and refer to this sheet in the next few weeks. These discharge instructions provide you with general information on caring for yourself after you leave the hospital. Your doctor may also give you specific instructions. While your treatment has been planned according to the most current medical practices available, unavoidable complications occasionally occur. If you have any problems or questions after discharge, call Dr. Therisa Doyne at [facility] [phone]).  HOME CARE INSTRUCTIONS  ACTIVITY:  You may resume your regular activity, but move at a slower pace for the next 24 hours.   Take frequent rest periods for the next 24 hours.   Walking will help get rid of the air and reduce the bloated feeling in your belly (abdomen).   No driving for 24 hours (because of the medicine (anesthesia) used during the test).   You may shower.   Do not sign any important legal documents or operate any machinery for 24 hours (because of the anesthesia used during the test).  NUTRITION:  Drink plenty of fluids.   You may resume your normal diet as instructed by your doctor.   Begin with a light meal and progress to your normal diet. Heavy or fried foods are harder to digest and may make you feel sick to your stomach (nauseated).   Avoid alcoholic beverages for 24 hours or as instructed.  MEDICATIONS:  You may resume your normal medications unless  your doctor tells you otherwise.  WHAT TO EXPECT TODAY:  Some feelings of bloating in the abdomen.   Passage of more gas than usual.   Spotting of blood in your stool or on the toilet paper.  IF YOU HAD POLYPS REMOVED DURING THE COLONOSCOPY:  No aspirin products for 7 days or as instructed.   No alcohol for 7 days or as instructed.   Eat a soft diet for the next 24 hours.   FINDING OUT THE RESULTS OF YOUR TEST  Not all test results are available during your visit. If your test results are not back during the visit, make an appointment with your caregiver to find out the results. Do not assume everything is normal if you have not heard from your caregiver or the medical facility. It is important for you to follow up on all of your test results.     SEEK IMMEDIATE MEDICAL CARE IF:   You have more than a spotting of blood in your stool.   Your belly is swollen (abdominal distention).   You are nauseated or vomiting.   You have a fever.   You have abdominal pain or discomfort that is severe or gets worse throughout the day.

## 2016-06-01 NOTE — Anesthesia Postprocedure Evaluation (Addendum)
Anesthesia Post Note  Patient: Miranda Padilla  Procedure(s) Performed: Procedure(s) (LRB): COLONOSCOPY WITH PROPOFOL (N/A)  Patient location during evaluation: Endoscopy Anesthesia Type: MAC Level of consciousness: awake and alert Pain management: pain level controlled Vital Signs Assessment: post-procedure vital signs reviewed and stable Respiratory status: spontaneous breathing, nonlabored ventilation, respiratory function stable and patient connected to nasal cannula oxygen Cardiovascular status: stable and blood pressure returned to baseline Anesthetic complications: no       Last Vitals:  Vitals:   06/01/16 1000 06/01/16 1010  BP: 104/71 115/72  Pulse: 91 89  Resp: 16 14  Temp:      Last Pain:  Vitals:   06/01/16 0953  TempSrc: Oral                 Nicholle Falzon,JAMES TERRILL

## 2016-06-01 NOTE — Transfer of Care (Signed)
Immediate Anesthesia Transfer of Care Note  Patient: Miranda Padilla  Procedure(s) Performed: Procedure(s): COLONOSCOPY WITH PROPOFOL (N/A)  Patient Location: PACU  Anesthesia Type:MAC  Level of Consciousness: awake, alert , oriented and patient cooperative  Airway & Oxygen Therapy: Patient Spontanous Breathing  Post-op Assessment: Report given to RN, Post -op Vital signs reviewed and stable, Patient moving all extremities and Patient moving all extremities X 4  Post vital signs: Reviewed and stable  Last Vitals:  Vitals:   06/01/16 0817 06/01/16 0953  BP: (!) 145/83 111/71  Pulse:  (!) 103  Resp: 15 19  Temp: 37.2 C 36.6 C    Last Pain:  Vitals:   06/01/16 0953  TempSrc: Oral         Complications: No apparent anesthesia complications

## 2016-06-01 NOTE — Discharge Instructions (Signed)
YOU HAD AN ENDOSCOPIC PROCEDURE TODAY: Refer to the procedure report and other information in the discharge instructions given to you for any specific questions about what was found during the examination. If this information does not answer your questions, please call Dr. Gessner's office at [phone] to clarify.   YOU SHOULD EXPECT: Some feelings of bloating in the abdomen. Passage of more gas than usual. Walking can help get rid of the air that was put into your GI tract during the procedure and reduce the bloating. If you had a lower endoscopy (such as a colonoscopy or flexible sigmoidoscopy) you may notice spotting of blood in your stool or on the toilet paper. Some abdominal soreness may be present for a day or two, also.  DIET: Your first meal following the procedure should be a light meal and then it is ok to progress to your normal diet. A half-sandwich or bowl of soup is an example of a good first meal. Heavy or fried foods are harder to digest and may make you feel nauseous or bloated. Drink plenty of fluids but you should avoid alcoholic beverages for 24 hours.   ACTIVITY: Your care partner should take you home directly after the procedure. You should plan to take it easy, moving slowly for the rest of the day. You can resume normal activity the day after the procedure however YOU SHOULD NOT DRIVE, use power tools, machinery or perform tasks that involve climbing or major physical exertion for 24 hours (because of the sedation medicines used during the test).   SYMPTOMS TO REPORT IMMEDIATELY: A gastroenterologist can be reached at any hour. Please call [phone]  for any of the following symptoms:  Following lower endoscopy (colonoscopy, flexible sigmoidoscopy) Excessive amounts of blood in the stool  Significant tenderness, worsening of abdominal pains  Swelling of the abdomen that is new, acute  Fever of 100 or higher  Following upper endoscopy (EGD, EUS, ERCP, esophageal  dilation) Vomiting of blood or coffee ground material  New, significant abdominal pain  New, significant chest pain or pain under the shoulder blades  Painful or persistently difficult swallowing  New shortness of breath  Black, tarry-looking or red, bloody stools  FOLLOW UP:  If any biopsies were taken you will be contacted by phone or by letter within the next 1-3 weeks. Call [phone]  if you have not heard about the biopsies in 3 weeks.  Please also call with any specific questions about appointments or follow up tests. 

## 2016-06-01 NOTE — Brief Op Note (Signed)
06/01/2016  9:50 AM  PATIENT:  Miranda Padilla  51 y.o. female  PRE-OPERATIVE DIAGNOSIS:  screening colonoscopy  POST-OPERATIVE DIAGNOSIS:  descending polyp, rectosigmoid polyps (2)  PROCEDURE:  Procedure(s): COLONOSCOPY WITH PROPOFOL (N/A)  SURGEON:  Surgeon(s) and Role:    * Ronnette Juniper, MD - Primary  PHYSICIAN ASSISTANT:   ASSISTANTS: none   ANESTHESIA:  Monitored anesthesia care EBL:  Total I/O In: 700 [I.V.:700] Out: 0   BLOOD ADMINISTERED:none  DRAINS: none   LOCAL MEDICATIONS USED:  NONE  SPECIMEN:  Source of Specimen:  colon polyps  DISPOSITION OF SPECIMEN:  PATHOLOGY  COUNTS: none  TOURNIQUET:  * No tourniquets in log *  DICTATION: .Dragon Dictation  PLAN OF CARE: Discharge to home after PACU  PATIENT DISPOSITION:  PACU - hemodynamically stable.   Delay start of Pharmacological VTE agent (>24hrs) due to surgical blood loss or risk of bleeding: not applicable

## 2016-06-01 NOTE — Anesthesia Postprocedure Evaluation (Signed)
Anesthesia Post Note  Patient: Miranda Padilla  Procedure(s) Performed: Procedure(s) (LRB): COLONOSCOPY WITH PROPOFOL (N/A)  Patient location during evaluation: Endoscopy Anesthesia Type: MAC Level of consciousness: awake and alert Pain management: pain level controlled Vital Signs Assessment: post-procedure vital signs reviewed and stable Respiratory status: spontaneous breathing, nonlabored ventilation, respiratory function stable and patient connected to nasal cannula oxygen Cardiovascular status: stable and blood pressure returned to baseline Anesthetic complications: no       Last Vitals:  Vitals:   06/01/16 1000 06/01/16 1010  BP: 104/71 115/72  Pulse: 91 89  Resp: 16 14  Temp:      Last Pain:  Vitals:   06/01/16 0953  TempSrc: Oral                 Maven Varelas,JAMES TERRILL     

## 2016-06-01 NOTE — H&P (Signed)
Miranda Padilla is an 51 y.o. female.   Chief Complaint: Screening colonoscopy HPI: 51 year old African-American female presents for average risk screening colonoscopy. There is no prior history of colonoscopy. There is no family history of colon cancer. No alarm symptoms noted.  Past Medical History:  Diagnosis Date  . Abdominal distension   . Abdominal pain   . Anemia   . Anxiety   . Arthritis   . Asthma   . Bronchitis   . Constipation   . Cough   . Depression   . Family history of adverse reaction to anesthesia    Pt mother would wake up during procedures  . GERD (gastroesophageal reflux disease)   . Headache(784.0)   . Nausea & vomiting   . Obsessive compulsive disorder    not on any medication at this time    Past Surgical History:  Procedure Laterality Date  . CHOLECYSTECTOMY  02/03/2011   Procedure: LAPAROSCOPIC CHOLECYSTECTOMY;  Surgeon: Harl Bowie, MD;  Location: [facility];  Service: General;  Laterality: N/A;  Laparoscopic Choleystectomy  . MOUTH SURGERY  2000    Family History  Problem Relation Age of Onset  . Thrombocytopenia Mother   . Anesthesia problems Mother   . Chronic Renal Failure Mother   . Cancer Maternal Grandmother        breast   Social History:  reports that she has never smoked. She has never used smokeless tobacco. She reports that she does not drink alcohol or use drugs.  Allergies:  Allergies  Allergen Reactions  . Sulfonamide Derivatives Itching and Rash    All over the body.    Medications Prior to Admission  Medication Sig Dispense Refill  . albuterol (PROVENTIL HFA;VENTOLIN HFA) 108 (90 BASE) MCG/ACT inhaler Inhale 2 puffs into the lungs every 6 (six) hours as needed. For shortness of breath.     Marland Kitchen omeprazole (PRILOSEC) 20 MG capsule Take 1 capsule (20 mg total) by mouth daily. 5 capsule 0  . ranitidine (ZANTAC) 150 MG tablet Take 150 mg by mouth 2 (two) times daily.        No results found for this or any previous  visit (from the past 48 hour(s)). No results found.  ROS  Blood pressure (!) 145/83, temperature 99 F (37.2 C), temperature source Oral, resp. rate 15, height 5\' 5"  (1.651 m), weight (!) 147 kg (324 lb), last menstrual period 03/24/2016, SpO2 98 %. Physical Exam  Patient is obese BMI of over 50, otherwise is cooperative and not in acute distress. Abdomen is soft, nondistended, nontender.  Assessment/Plan Plan for an average risk screening colonoscopy.  Ronnette Juniper, MD 06/01/2016, 9:04 AM

## 2016-06-01 NOTE — Op Note (Signed)
[facility] Patient Name: Miranda Padilla Procedure Date : 06/01/2016 MRN: [id number] Attending MD: Ronnette Juniper , MD Date of Birth: [birth year] CSN: [id number] Age: 51 Admit Type: Outpatient Procedure:                Colonoscopy Indications:              Screening for colorectal malignant neoplasm, This                            is the patient's first colonoscopy Providers:                Ronnette Juniper, MD, Carolynn Comment, RN, Cletis Athens,                            Technician, Marcene Duos, Technician Referring MD:              Medicines:                Monitored Anesthesia Care Complications:            No immediate complications. Estimated Blood Loss:     Estimated blood loss: none. Estimated blood loss:                            none. Estimated blood loss: none. Procedure:                Pre-Anesthesia Assessment:                           - Prior to the procedure, a History and Physical                            was performed, and patient medications and                            allergies were reviewed. The patient's tolerance of                            previous anesthesia was also reviewed. The risks                            and benefits of the procedure and the sedation                            options and risks were discussed with the patient.                            All questions were answered, and informed consent                            was obtained. Prior Anticoagulants: The patient has                            taken no previous anticoagulant or antiplatelet  agents. ASA Grade Assessment: III - A patient with                            severe systemic disease. After reviewing the risks                            and benefits, the patient was deemed in                            satisfactory condition to undergo the procedure.                           After obtaining informed consent, the colonoscope                   was passed under direct vision. Throughout the                            procedure, the patient's blood pressure, pulse, and                            oxygen saturations were monitored continuously. The                            EC-[id number] ([id number]) scope was introduced through                            the anus and advanced to the the cecum, identified                            by appendiceal orifice and ileocecal valve. The                            colonoscopy was technically difficult and complex                            due to restricted mobility of the colon,                            significant looping and the patient's body habitus.                            Successful completion of the procedure was aided by                            changing the patient to a supine position. The                            patient tolerated the procedure well. The quality                            of the bowel preparation was good. The ileocecal  valve, appendiceal orifice, and rectum were                            photographed. Findings:      The perianal and digital rectal examinations were normal. Pertinent       negatives include normal sphincter tone.      A 6 mm polyp was found in the ascending colon. The polyp was sessile.       The polyp was removed with a piecemeal technique using a cold biopsy       forceps. Resection and retrieval were complete using a biopsy forceps.      Two sessile polyps were found in the recto-sigmoid colon. The polyps       were 2 to 5 mm in size. These polyps were removed with a cold biopsy       forceps. Resection and retrieval were complete.      The exam was otherwise without abnormality.      The retroflexed view of the distal rectum and anal verge was normal and       showed no anal or rectal abnormalities. Impression:               - One 6 mm polyp in the ascending colon, removed                             piecemeal using a cold biopsy forceps. Resected and                            retrieved.                           - Two 2 to 5 mm polyps at the recto-sigmoid colon,                            removed with a cold biopsy forceps. Resected and                            retrieved.                           - The examination was otherwise normal.                           - The distal rectum and anal verge are normal on                            retroflexion view. Moderate Sedation:      Patient did not receive moderate sedation for this procedure, but       instead received monitored anesthesia care. Recommendation:           - Patient has a contact number available for                            emergencies. The signs and symptoms of potential                            delayed complications were  discussed with the                            patient. Return to normal activities tomorrow.                            Written discharge instructions were provided to the                            patient.                           - Resume regular diet.                           - Continue present medications.                           - Await pathology results.                           - Repeat colonoscopy in 5-10 years for surveillance                            based on pathology results. Procedure Code(s):        --- Professional ---                           [phone], Colonoscopy, flexible; with biopsy, single                            or multiple Diagnosis Code(s):        --- Professional ---                           Z12.11, Encounter for screening for malignant                            neoplasm of colon                           D12.2, Benign neoplasm of ascending colon                           D12.7, Benign neoplasm of rectosigmoid junction CPT copyright 2016 [employer]. All rights reserved. The codes documented in this report are preliminary and upon coder  review may  be revised to meet current compliance requirements. Ronnette Juniper, MD 06/01/2016 9:49:30 AM This report has been signed electronically. Number of Addenda: 0

## 2016-06-02 ENCOUNTER — Encounter (HOSPITAL_COMMUNITY): Payer: Self-pay | Admitting: Gastroenterology

## 2016-06-23 NOTE — Addendum Note (Signed)
Addendum  created 06/23/16 1451 by Rica Koyanagi, MD   Sign clinical note

## 2017-01-17 DIAGNOSIS — H5213 Myopia, bilateral: Secondary | ICD-10-CM | POA: Diagnosis not present

## 2017-01-22 DIAGNOSIS — Z1231 Encounter for screening mammogram for malignant neoplasm of breast: Secondary | ICD-10-CM | POA: Diagnosis not present

## 2017-02-05 DIAGNOSIS — H10523 Angular blepharoconjunctivitis, bilateral: Secondary | ICD-10-CM | POA: Diagnosis not present

## 2017-03-08 DIAGNOSIS — R51 Headache: Secondary | ICD-10-CM | POA: Diagnosis not present

## 2017-03-08 DIAGNOSIS — G43909 Migraine, unspecified, not intractable, without status migrainosus: Secondary | ICD-10-CM | POA: Diagnosis not present

## 2017-03-08 DIAGNOSIS — D649 Anemia, unspecified: Secondary | ICD-10-CM | POA: Diagnosis not present

## 2017-03-08 DIAGNOSIS — R5383 Other fatigue: Secondary | ICD-10-CM | POA: Diagnosis not present

## 2017-03-09 ENCOUNTER — Other Ambulatory Visit: Payer: Self-pay | Admitting: Family Medicine

## 2017-03-09 ENCOUNTER — Telehealth (HOSPITAL_COMMUNITY): Payer: Self-pay | Admitting: Family Medicine

## 2017-03-09 DIAGNOSIS — R011 Cardiac murmur, unspecified: Secondary | ICD-10-CM

## 2017-03-09 NOTE — Telephone Encounter (Signed)
User: Cherie Dark A Date/time: 03/09/17 9:42 AM  Comment: Called pt and lmsg for her to CB to get sch for echo.Vassie Moment  Context:  Outcome: Left Message  Phone number: [phone] Phone Type: Home Phone  Comm. type: Telephone Call type: Outgoing  Contact: Hall-Rodriguez, Lulani Relation to patient: Self

## 2017-03-12 ENCOUNTER — Other Ambulatory Visit: Payer: Self-pay | Admitting: Family Medicine

## 2017-03-12 DIAGNOSIS — R519 Headache, unspecified: Secondary | ICD-10-CM

## 2017-03-12 DIAGNOSIS — R51 Headache: Principal | ICD-10-CM

## 2017-03-16 ENCOUNTER — Other Ambulatory Visit: Payer: Self-pay

## 2017-03-16 ENCOUNTER — Ambulatory Visit (HOSPITAL_COMMUNITY): Payer: 59 | Attending: Cardiovascular Disease

## 2017-03-16 DIAGNOSIS — R011 Cardiac murmur, unspecified: Secondary | ICD-10-CM | POA: Diagnosis not present

## 2017-03-16 DIAGNOSIS — Z6841 Body Mass Index (BMI) 40.0 and over, adult: Secondary | ICD-10-CM | POA: Insufficient documentation

## 2017-03-21 ENCOUNTER — Other Ambulatory Visit: Payer: 59

## 2017-03-23 ENCOUNTER — Ambulatory Visit
Admission: RE | Admit: 2017-03-23 | Discharge: 2017-03-23 | Disposition: A | Discharge status: Home / Self Care | Payer: 59 | Source: Ambulatory Visit | Attending: Family Medicine | Admitting: Family Medicine

## 2017-03-23 DIAGNOSIS — G43909 Migraine, unspecified, not intractable, without status migrainosus: Secondary | ICD-10-CM | POA: Diagnosis not present

## 2017-03-23 DIAGNOSIS — R51 Headache: Principal | ICD-10-CM

## 2017-03-23 DIAGNOSIS — R519 Headache, unspecified: Secondary | ICD-10-CM

## 2017-03-23 IMAGING — CT CT HEAD W/O CM
4 of 5 series · 16 of 47 positions shown, 18 images · non-contrast
Comparison: None

CLINICAL DATA: Migraines for 3 months

EXAM:
CT HEAD WITHOUT CONTRAST
TECHNIQUE: Contiguous axial images were obtained from the base of the skull
through the vertex without intravenous contrast. Sagittal and
coronal MPR images reconstructed from axial data set.

[Series 2: head 5.00 hr40 s3 ibhc · axial · 0.36mm/px · z∈[-530,-425]mm · 8 of 29 slices shown, 10 images]
[im 4/29  brain]
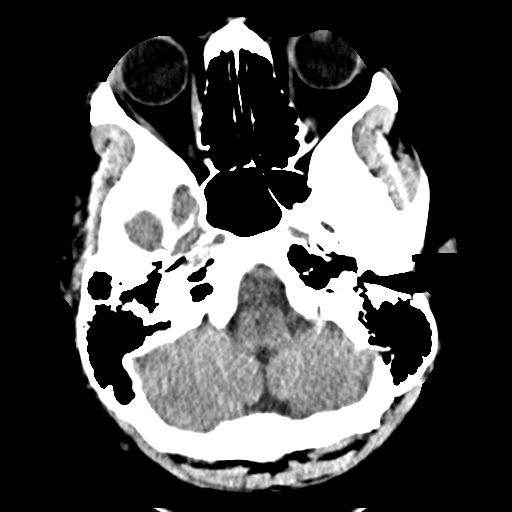
[im 4/29  bone]
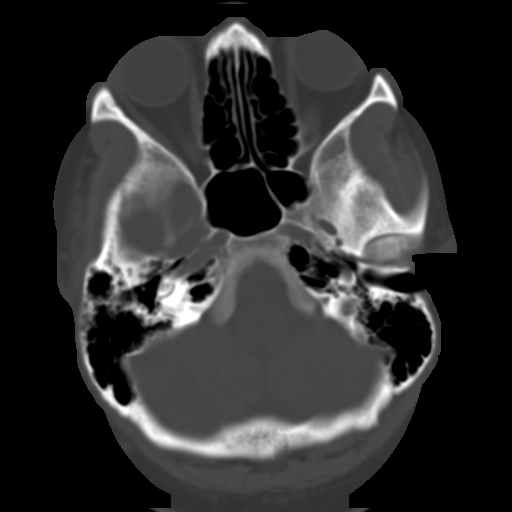
[im 7/29  brain]
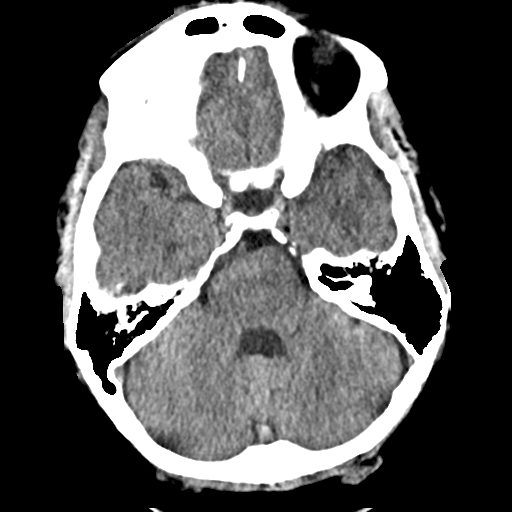
[im 10/29  brain]
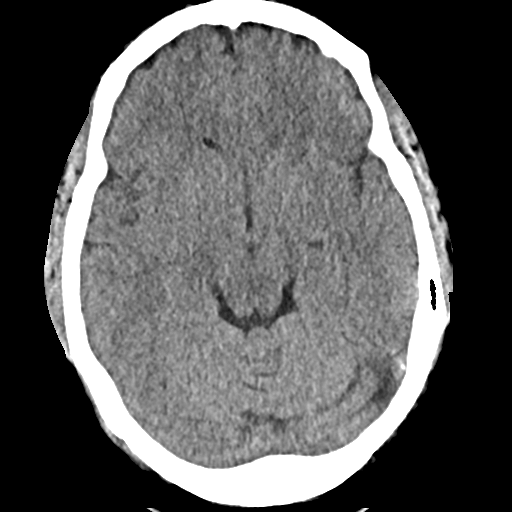
[im 13/29  brain]
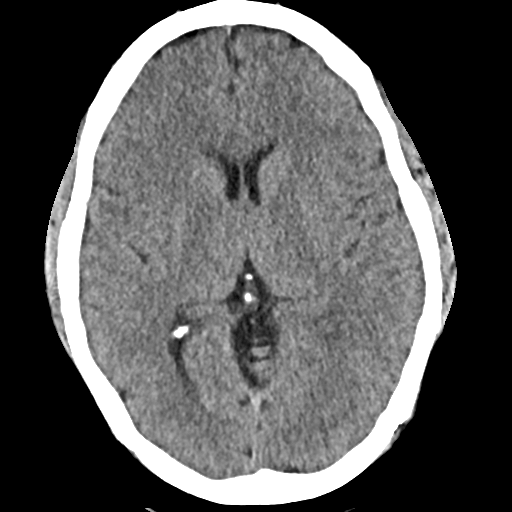
[im 16/29  brain]
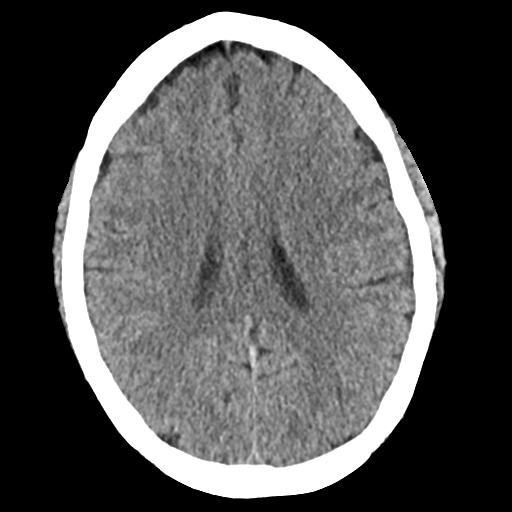
[im 16/29  bone]
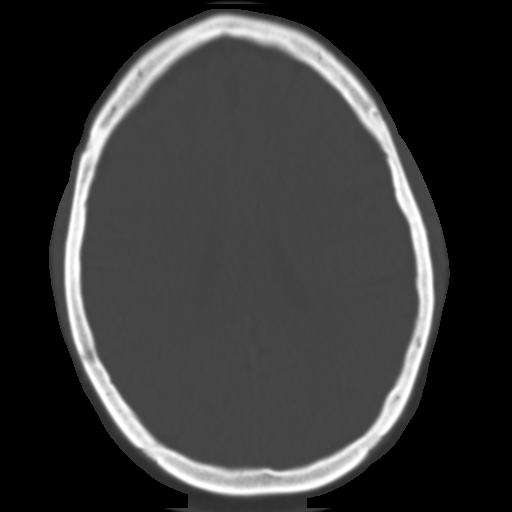
[im 19/29  brain]
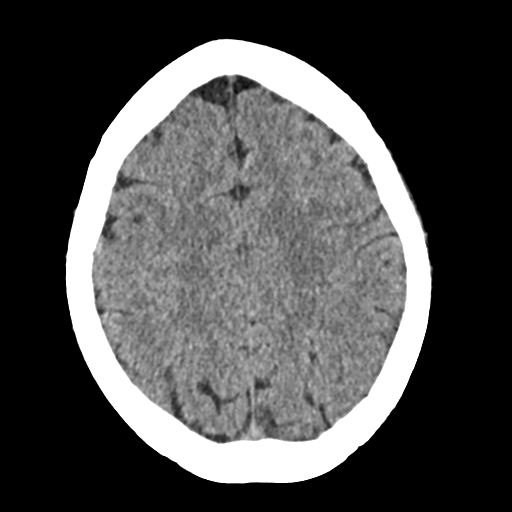
[im 22/29  brain]
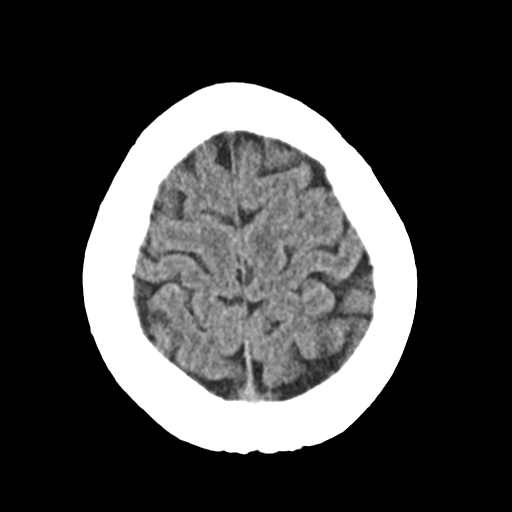
[im 25/29  brain]
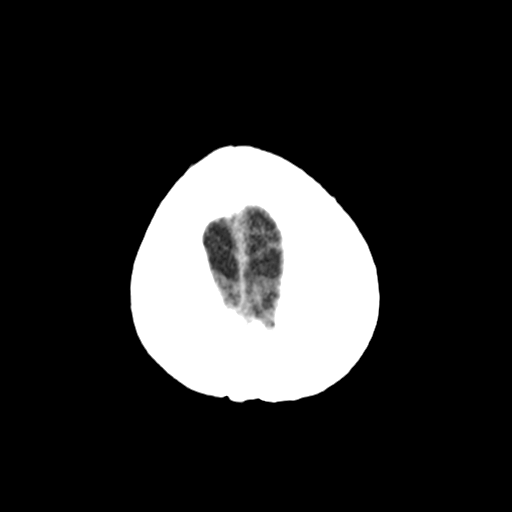

[Series 3: head 5.00 hr40 s3 ax · axial · 0.33mm/px · z∈[-552,-537]mm · 2 of 28 slices shown]
[im 4/28  brain]
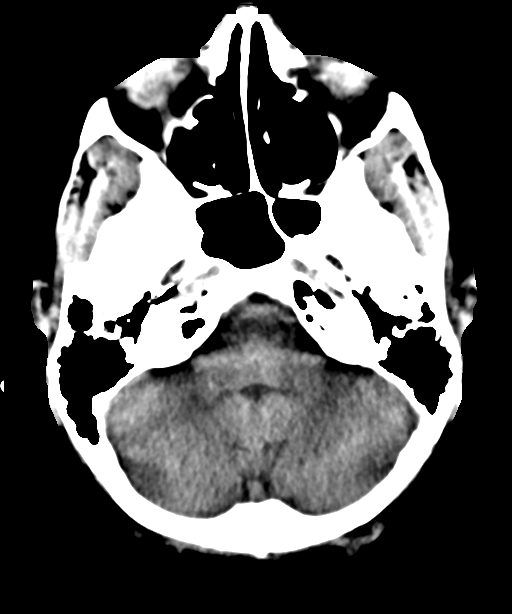
[im 7/28  brain]
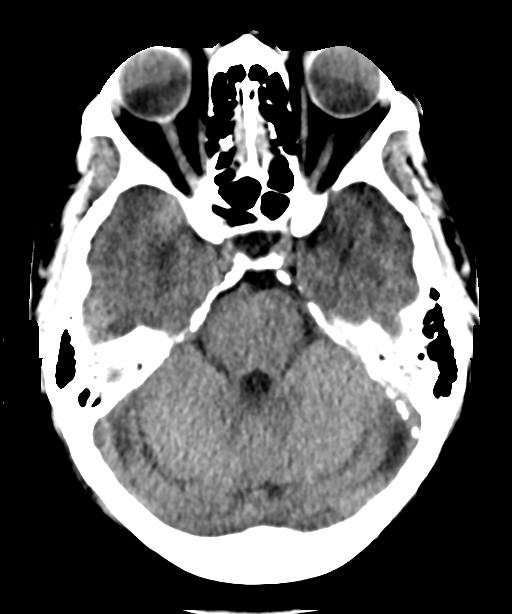

[Series 7: head 3.00 hr40 s3 cor · coronal · 0.28mm/px · 3 of 70 slices shown]
[im 24/70  brain]
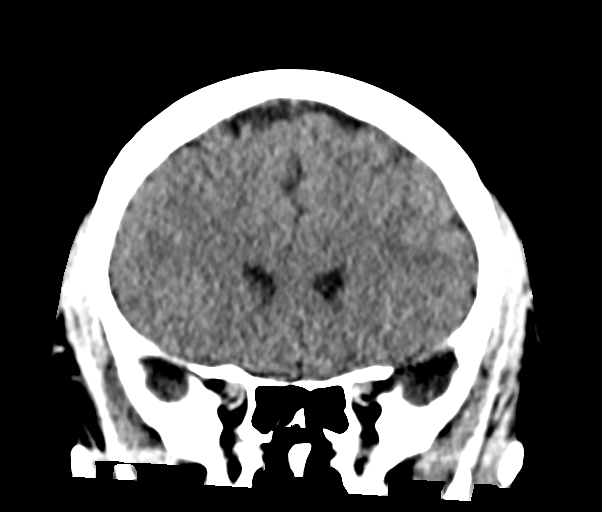
[im 31/70  brain]
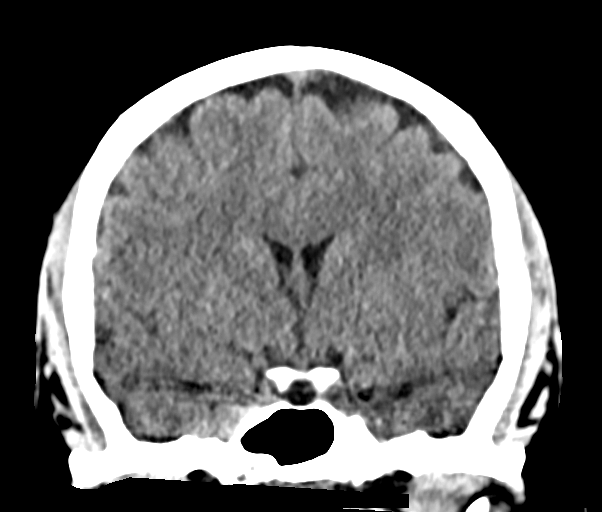
[im 39/70  brain]
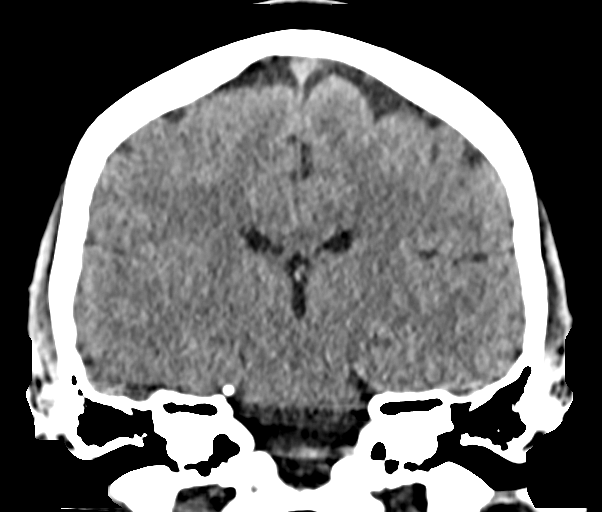

[Series 9: head 3.00 hr40 s3 sag · sagittal · 0.28mm/px · 3 of 55 slices shown]
[im 19/55  brain]
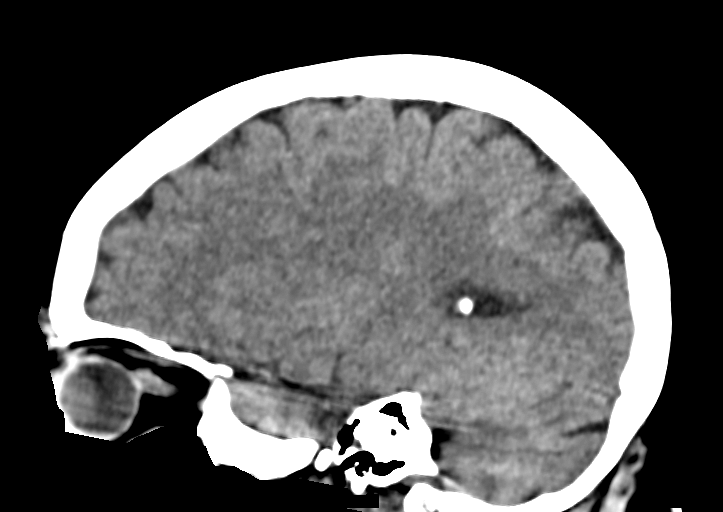
[im 28/55  brain]
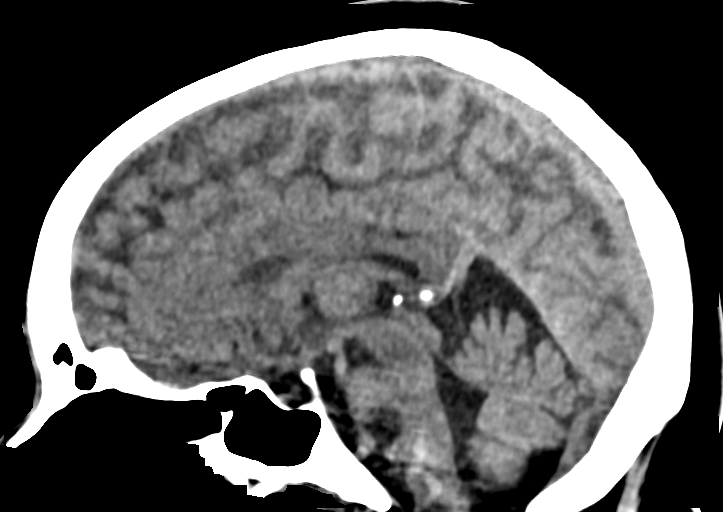
[im 37/55  brain]
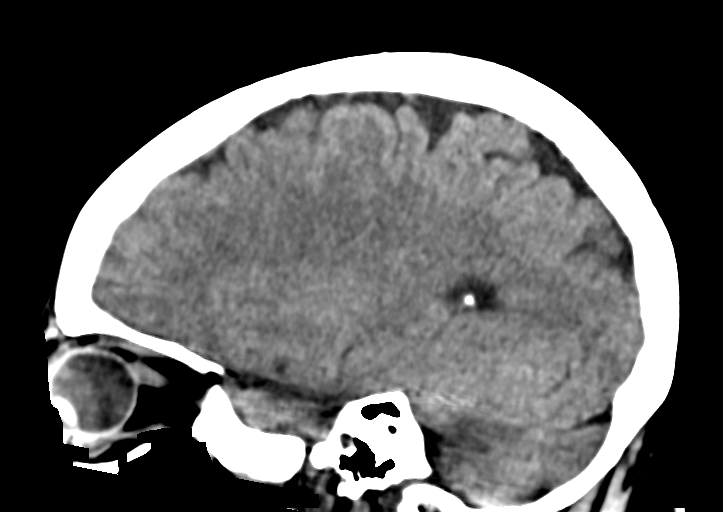

[16 of 47 positions shown; findings below may reference images not displayed]

FINDINGS: Brain: Normal ventricular morphology. No midline shift or mass
effect. Normal appearance of brain parenchyma. No intracranial
hemorrhage, mass lesion, evidence of acute infarction, or
extra-axial fluid collection.

Vascular: Unremarkable.  No hyperdense vessels.

Skull: Intact

Sinuses/Orbits: Clear

Other: N/A
IMPRESSION: Normal exam.

## 2017-05-01 DIAGNOSIS — Z049 Encounter for examination and observation for unspecified reason: Secondary | ICD-10-CM | POA: Diagnosis not present

## 2017-05-01 DIAGNOSIS — G43719 Chronic migraine without aura, intractable, without status migrainosus: Secondary | ICD-10-CM | POA: Diagnosis not present

## 2017-05-01 DIAGNOSIS — G43839 Menstrual migraine, intractable, without status migrainosus: Secondary | ICD-10-CM | POA: Diagnosis not present

## 2017-05-02 DIAGNOSIS — G43719 Chronic migraine without aura, intractable, without status migrainosus: Secondary | ICD-10-CM | POA: Diagnosis not present

## 2017-05-02 DIAGNOSIS — G43839 Menstrual migraine, intractable, without status migrainosus: Secondary | ICD-10-CM | POA: Diagnosis not present

## 2017-05-17 DIAGNOSIS — G43839 Menstrual migraine, intractable, without status migrainosus: Secondary | ICD-10-CM | POA: Diagnosis not present

## 2017-05-17 DIAGNOSIS — G43719 Chronic migraine without aura, intractable, without status migrainosus: Secondary | ICD-10-CM | POA: Diagnosis not present

## 2017-05-31 DIAGNOSIS — G43719 Chronic migraine without aura, intractable, without status migrainosus: Secondary | ICD-10-CM | POA: Diagnosis not present

## 2017-05-31 DIAGNOSIS — G43839 Menstrual migraine, intractable, without status migrainosus: Secondary | ICD-10-CM | POA: Diagnosis not present

## 2017-06-15 DIAGNOSIS — G43719 Chronic migraine without aura, intractable, without status migrainosus: Secondary | ICD-10-CM | POA: Diagnosis not present

## 2017-06-15 DIAGNOSIS — G43839 Menstrual migraine, intractable, without status migrainosus: Secondary | ICD-10-CM | POA: Diagnosis not present

## 2017-06-28 DIAGNOSIS — G43719 Chronic migraine without aura, intractable, without status migrainosus: Secondary | ICD-10-CM | POA: Diagnosis not present

## 2017-06-28 DIAGNOSIS — G43839 Menstrual migraine, intractable, without status migrainosus: Secondary | ICD-10-CM | POA: Diagnosis not present

## 2017-07-13 DIAGNOSIS — G43839 Menstrual migraine, intractable, without status migrainosus: Secondary | ICD-10-CM | POA: Diagnosis not present

## 2017-07-13 DIAGNOSIS — G43719 Chronic migraine without aura, intractable, without status migrainosus: Secondary | ICD-10-CM | POA: Diagnosis not present

## 2017-08-23 DIAGNOSIS — G43719 Chronic migraine without aura, intractable, without status migrainosus: Secondary | ICD-10-CM | POA: Diagnosis not present

## 2017-09-21 DIAGNOSIS — Z0000 Encounter for general adult medical examination without abnormal findings: Secondary | ICD-10-CM | POA: Diagnosis not present

## 2017-09-21 DIAGNOSIS — E782 Mixed hyperlipidemia: Secondary | ICD-10-CM | POA: Diagnosis not present

## 2017-09-21 DIAGNOSIS — D72819 Decreased white blood cell count, unspecified: Secondary | ICD-10-CM | POA: Diagnosis not present

## 2017-09-21 DIAGNOSIS — Z23 Encounter for immunization: Secondary | ICD-10-CM | POA: Diagnosis not present

## 2017-10-02 DIAGNOSIS — G43719 Chronic migraine without aura, intractable, without status migrainosus: Secondary | ICD-10-CM | POA: Diagnosis not present

## 2017-12-26 DIAGNOSIS — R062 Wheezing: Secondary | ICD-10-CM | POA: Diagnosis not present

## 2017-12-26 DIAGNOSIS — J209 Acute bronchitis, unspecified: Secondary | ICD-10-CM | POA: Diagnosis not present

## 2018-01-03 DIAGNOSIS — G43719 Chronic migraine without aura, intractable, without status migrainosus: Secondary | ICD-10-CM | POA: Diagnosis not present

## 2018-01-25 DIAGNOSIS — Z1231 Encounter for screening mammogram for malignant neoplasm of breast: Secondary | ICD-10-CM | POA: Diagnosis not present

## 2018-04-10 DIAGNOSIS — G43719 Chronic migraine without aura, intractable, without status migrainosus: Secondary | ICD-10-CM | POA: Diagnosis not present

## 2018-05-10 DIAGNOSIS — R112 Nausea with vomiting, unspecified: Secondary | ICD-10-CM | POA: Diagnosis not present

## 2018-05-10 DIAGNOSIS — R197 Diarrhea, unspecified: Secondary | ICD-10-CM | POA: Diagnosis not present

## 2019-02-03 ENCOUNTER — Other Ambulatory Visit: Payer: Self-pay

## 2019-02-03 ENCOUNTER — Encounter (HOSPITAL_COMMUNITY): Payer: Self-pay

## 2019-02-03 ENCOUNTER — Emergency Department (HOSPITAL_COMMUNITY)
Admission: EM | Admit: 2019-02-03 | Discharge: 2019-02-03 | Disposition: A | Discharge status: Home / Self Care | Payer: 59 | Attending: Emergency Medicine | Admitting: Emergency Medicine

## 2019-02-03 DIAGNOSIS — J45909 Unspecified asthma, uncomplicated: Secondary | ICD-10-CM | POA: Insufficient documentation

## 2019-02-03 DIAGNOSIS — T50905A Adverse effect of unspecified drugs, medicaments and biological substances, initial encounter: Secondary | ICD-10-CM

## 2019-02-03 DIAGNOSIS — T50Z95A Adverse effect of other vaccines and biological substances, initial encounter: Secondary | ICD-10-CM | POA: Diagnosis not present

## 2019-02-03 DIAGNOSIS — R220 Localized swelling, mass and lump, head: Secondary | ICD-10-CM | POA: Diagnosis present

## 2019-02-03 MED ORDER — FAMOTIDINE 20 MG PO TABS
20.0000 mg | ORAL_TABLET | Freq: Once | ORAL | Status: AC
  Administered 2019-02-03: 20 mg via ORAL
  Filled 2019-02-03: qty 1

## 2019-02-03 MED ORDER — PREDNISONE 20 MG PO TABS
60.0000 mg | ORAL_TABLET | Freq: Once | ORAL | Status: AC
  Administered 2019-02-03: 60 mg via ORAL
  Filled 2019-02-03: qty 3

## 2019-02-03 NOTE — ED Provider Notes (Signed)
[place] DEPT Provider Note   CSN: [id number] Arrival date & time: 02/03/19  1512     History Chief Complaint  Patient presents with  . covid vaccine issues    Miranda Padilla is a 54 y.o. female.  Patient c/o possibly having allergic reaction post covid vaccine today. States received vaccine around 1 pm today. Shortly after, acute onset mild itchy sensation intermittently over various areas of body. Then states felt swollen area to forehead. Did not notice any rash, skin lesions or erythema. No chest pain or discomfort. No faintness or syncope. No palpitations. No throat closing. States took 50 mg of benadryl a couple hours ago and is feeling improved. No other new medication use. No change in home or personal products. No change in foods or new foods. No hx similar symptoms in past. No fever or chills. States felt fine, asymptomatic prior to vaccination.   The history is provided by the patient.       Past Medical History:  Diagnosis Date  . Abdominal distension   . Abdominal pain   . Anemia   . Anxiety   . Arthritis   . Asthma   . Bronchitis   . Constipation   . Cough   . Depression   . Family history of adverse reaction to anesthesia    Pt mother would wake up during procedures  . GERD (gastroesophageal reflux disease)   . Headache(784.0)   . Nausea & vomiting   . Obsessive compulsive disorder    not on any medication at this time    Patient Active Problem List   Diagnosis Date Noted  . Symptomatic cholelithiasis 01/31/2011  . ALLERGIC RHINITIS 07/02/2008  . HEADACHE, CHRONIC 07/02/2008  . DYSPNEA 07/02/2008  . COUGH 07/02/2008  . OBESITY 07/01/2008  . SICKLE CELL TRAIT 07/01/2008  . OBSESSIVE-COMPULSIVE DISORDER 07/01/2008  . DEPRESSION 07/01/2008    Past Surgical History:  Procedure Laterality Date  . CHOLECYSTECTOMY  02/03/2011   Procedure: LAPAROSCOPIC CHOLECYSTECTOMY;  Surgeon: Harl Bowie, MD;  Location:  [facility];  Service: General;  Laterality: N/A;  Laparoscopic Choleystectomy  . COLONOSCOPY WITH PROPOFOL N/A 06/01/2016   Procedure: COLONOSCOPY WITH PROPOFOL;  Surgeon: Ronnette Juniper, MD;  Location: [place];  Service: Gastroenterology;  Laterality: N/A;  . MOUTH SURGERY  2000     OB History   No obstetric history on file.     Family History  Problem Relation Age of Onset  . Thrombocytopenia Mother   . Anesthesia problems Mother   . Chronic Renal Failure Mother   . Cancer Maternal Grandmother        breast    Social History   Tobacco Use  . Smoking status: Never Smoker  . Smokeless tobacco: Never Used  Substance Use Topics  . Alcohol use: No  . Drug use: No    Home Medications Prior to Admission medications   Medication Sig Start Date End Date Taking? Authorizing Provider  albuterol (PROVENTIL HFA;VENTOLIN HFA) 108 (90 BASE) MCG/ACT inhaler Inhale 2 puffs into the lungs every 6 (six) hours as needed. For shortness of breath.     [provider]  omeprazole (PRILOSEC) 20 MG capsule Take 1 capsule (20 mg total) by mouth daily. 01/07/11 01/07/12  Riki Altes, MD  ranitidine (ZANTAC) 150 MG tablet Take 150 mg by mouth 2 (two) times daily.      [provider]    Allergies    Sulfonamide derivatives  Review of Systems  Review of Systems  Constitutional: Negative for fever.  HENT: Negative for sore throat and trouble swallowing.   Eyes: Negative for redness.  Respiratory: Negative for cough, shortness of breath and wheezing.   Cardiovascular: Negative for chest pain.  Gastrointestinal: Negative for abdominal pain.  Genitourinary: Negative for dysuria and flank pain.  Musculoskeletal: Negative for neck pain.  Skin: Negative for rash.  Neurological: Negative for light-headedness and headaches.  Hematological: Does not bruise/bleed easily.  Psychiatric/Behavioral: Negative for confusion.    Physical Exam Updated Vital Signs BP (!) 131/94 (BP  Location: Left Arm)   Pulse 80   Temp 98.3 F (36.8 C) (Oral)   Resp 18   Ht 1.651 m (5\' 5" )   Wt 134.5 kg   LMP 03/24/2016 (Exact Date)   SpO2 99%   BMI 49.34 kg/m   Physical Exam Vitals and nursing note reviewed.  Constitutional:      Appearance: Normal appearance. She is well-developed.  HENT:     Head: Atraumatic.     Comments: No current facial swelling, lesions, erythema or rash.     Nose: Nose normal.     Mouth/Throat:     Mouth: Mucous membranes are moist.     Pharynx: Oropharynx is clear. No oropharyngeal exudate or posterior oropharyngeal erythema.     Comments: No angioedema.  Eyes:     General: No scleral icterus.    Conjunctiva/sclera: Conjunctivae normal.  Neck:     Trachea: No tracheal deviation.  Cardiovascular:     Rate and Rhythm: Normal rate and regular rhythm.     Pulses: Normal pulses.     Heart sounds: Normal heart sounds. No murmur. No friction rub. No gallop.   Pulmonary:     Effort: Pulmonary effort is normal. No respiratory distress.     Breath sounds: Normal breath sounds.  Abdominal:     General: Bowel sounds are normal. There is no distension.     Palpations: Abdomen is soft.     Tenderness: There is no abdominal tenderness.  Genitourinary:    Comments: No cva tenderness.  Musculoskeletal:        General: No swelling.     Cervical back: Normal range of motion and neck supple. No rigidity. No muscular tenderness.     Right lower leg: No edema.     Left lower leg: No edema.  Skin:    General: Skin is warm and dry.     Findings: No rash.  Neurological:     Mental Status: She is alert.     Comments: Alert, speech normal. Steady gait.   Psychiatric:        Mood and Affect: Mood normal.     ED Results / Procedures / Treatments   Labs (all labs ordered are listed, but only abnormal results are displayed) Labs Reviewed - No data to display  EKG None  Radiology No results found.  Procedures Procedures (including critical care  time)  Medications Ordered in ED Medications  predniSONE (DELTASONE) tablet 60 mg (has no administration in time range)  famotidine (PEPCID) tablet 20 mg (has no administration in time range)    ED Course  I have reviewed the triage vital signs and the nursing notes.  Pertinent labs & imaging results that were available during my care of the patient were reviewed by me and considered in my medical decision making (see chart for details).    MDM Rules/Calculators/A&P  Symptoms felt c/w possible allergic reaction.  Patient already took benadryl 50 mg.   Pepcid 20 mg po. Prednisone po.   Reviewed nursing notes and prior charts for additional history.   On exam in ED, no throat swelling, wheezing, increased wob, cp, or rash.   Patient appears stable for d/c.  Return precautions provided.          Final Clinical Impression(s) / ED Diagnoses Final diagnoses:  None    Rx / DC Orders ED Discharge Orders    None       Lajean Saver, MD 02/06/19 1402

## 2019-02-03 NOTE — Discharge Instructions (Addendum)
It was our pleasure to provide your ER care today - we hope that you feel better.  Take benadryl 25-50 mg every 6 hours as need - causes drowsiness, no driving when taking.  Inform your doctors/care providers in future of possible allergic reaction after receiving COVID vaccine.   Return to ER if worse, new symptoms, severe itching, rash, swelling, trouble breathing, throat swelling, or other concern.

## 2019-02-03 NOTE — ED Triage Notes (Signed)
Patient states she received the Covid vaccine approx at 1300 today. Patient states about 1 hour later she began feeling itchy, swelling around the right eye. Patient states when she got back to the office she took some Benadryl po. Patient states she had a little bit of issues with sucking on the straw. Patient states since taking the Benadryl she seems better, still having an itch.

## 2020-03-07 ENCOUNTER — Other Ambulatory Visit: Payer: Self-pay

## 2020-03-07 ENCOUNTER — Emergency Department (HOSPITAL_BASED_OUTPATIENT_CLINIC_OR_DEPARTMENT_OTHER)
Admission: EM | Admit: 2020-03-07 | Discharge: 2020-03-08 | Disposition: A | Discharge status: Home / Self Care | Payer: 59 | Attending: Emergency Medicine | Admitting: Emergency Medicine

## 2020-03-07 ENCOUNTER — Emergency Department (HOSPITAL_BASED_OUTPATIENT_CLINIC_OR_DEPARTMENT_OTHER): Payer: 59

## 2020-03-07 ENCOUNTER — Encounter (HOSPITAL_BASED_OUTPATIENT_CLINIC_OR_DEPARTMENT_OTHER): Payer: Self-pay | Admitting: Emergency Medicine

## 2020-03-07 DIAGNOSIS — M79672 Pain in left foot: Secondary | ICD-10-CM

## 2020-03-07 DIAGNOSIS — Z79899 Other long term (current) drug therapy: Secondary | ICD-10-CM | POA: Insufficient documentation

## 2020-03-07 DIAGNOSIS — I878 Other specified disorders of veins: Secondary | ICD-10-CM

## 2020-03-07 DIAGNOSIS — M79671 Pain in right foot: Secondary | ICD-10-CM | POA: Diagnosis present

## 2020-03-07 DIAGNOSIS — R609 Edema, unspecified: Secondary | ICD-10-CM

## 2020-03-07 DIAGNOSIS — J45909 Unspecified asthma, uncomplicated: Secondary | ICD-10-CM | POA: Diagnosis not present

## 2020-03-07 DIAGNOSIS — I872 Venous insufficiency (chronic) (peripheral): Secondary | ICD-10-CM | POA: Diagnosis not present

## 2020-03-07 DIAGNOSIS — M7989 Other specified soft tissue disorders: Secondary | ICD-10-CM

## 2020-03-07 LAB — CBC WITH DIFFERENTIAL/PLATELET
Abs Immature Granulocytes: 0.04 10*3/uL (ref 0.00–0.07)
Basophils Absolute: 0 10*3/uL (ref 0.0–0.1)
Basophils Relative: 1 %
Eosinophils Absolute: 0.1 10*3/uL (ref 0.0–0.5)
Eosinophils Relative: 2 %
HCT: 37.5 % (ref 36.0–46.0)
Hemoglobin: 12 g/dL (ref 12.0–15.0)
Immature Granulocytes: 1 %
Lymphocytes Relative: 30 %
Lymphs Abs: 1.3 10*3/uL (ref 0.7–4.0)
MCH: 26.4 pg (ref 26.0–34.0)
MCHC: 32 g/dL (ref 30.0–36.0)
MCV: 82.4 fL (ref 80.0–100.0)
Monocytes Absolute: 0.5 10*3/uL (ref 0.1–1.0)
Monocytes Relative: 11 %
Neutro Abs: 2.3 10*3/uL (ref 1.7–7.7)
Neutrophils Relative %: 55 %
Platelets: 394 10*3/uL (ref 150–400)
RBC: 4.55 MIL/uL (ref 3.87–5.11)
RDW: 14.8 % (ref 11.5–15.5)
WBC: 4.3 10*3/uL (ref 4.0–10.5)
nRBC: 0 % (ref 0.0–0.2)

## 2020-03-07 LAB — BASIC METABOLIC PANEL
Anion gap: 10 (ref 5–15)
BUN: 13 mg/dL (ref 6–20)
CO2: 24 mmol/L (ref 22–32)
Calcium: 8.6 mg/dL — ABNORMAL LOW (ref 8.9–10.3)
Chloride: 106 mmol/L (ref 98–111)
Creatinine, Ser: 0.75 mg/dL (ref 0.44–1.00)
GFR, Estimated: >60 mL/min (ref >60)
Glucose, Bld: 90 mg/dL (ref 70–99)
Potassium: 3.4 mmol/L — ABNORMAL LOW (ref 3.5–5.1)
Sodium: 140 mmol/L (ref 135–145)

## 2020-03-07 IMAGING — US US EXTREM LOW VENOUS
1 series · 14 of 24 positions shown · non-contrast
Comparison: None.

CLINICAL DATA: Bilateral ankle pain and bilateral ankle and feet
swelling for 1 month.

EXAM:
BILATERAL LOWER EXTREMITY VENOUS DOPPLER ULTRASOUND
TECHNIQUE: Gray-scale sonography with compression, as well as color and duplex
ultrasound, were performed to evaluate the deep venous system(s)
from the level of the common femoral vein through the popliteal and
proximal calf veins.

[Series 1: us extrem low venous · 14 of 76 slices shown]
[im 1/76]
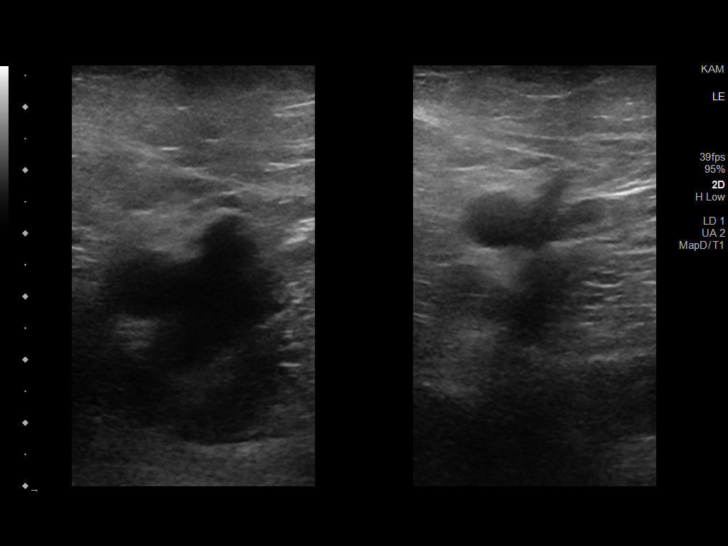
[im 7/76]
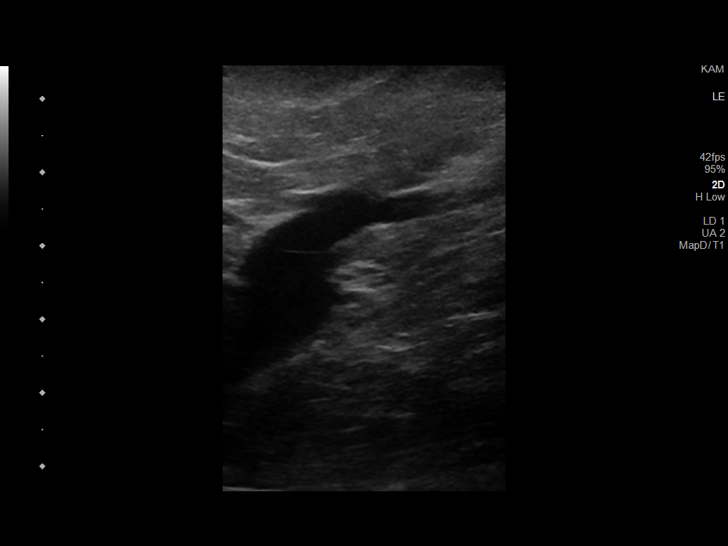
[im 14/76]
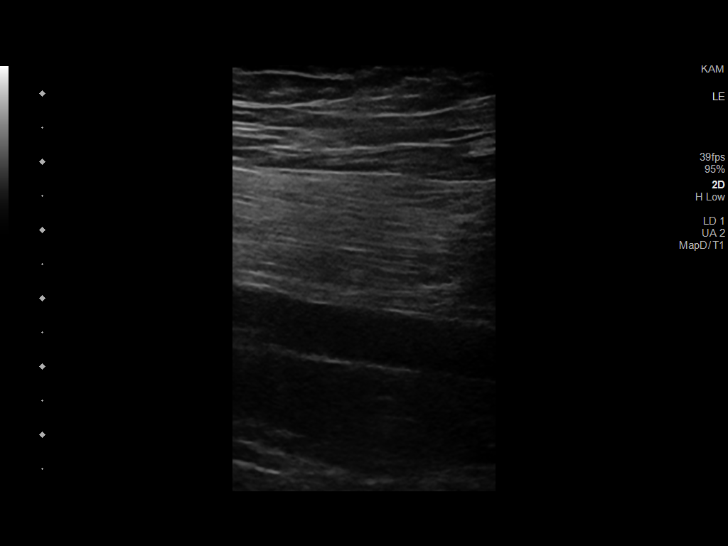
[im 20/76]
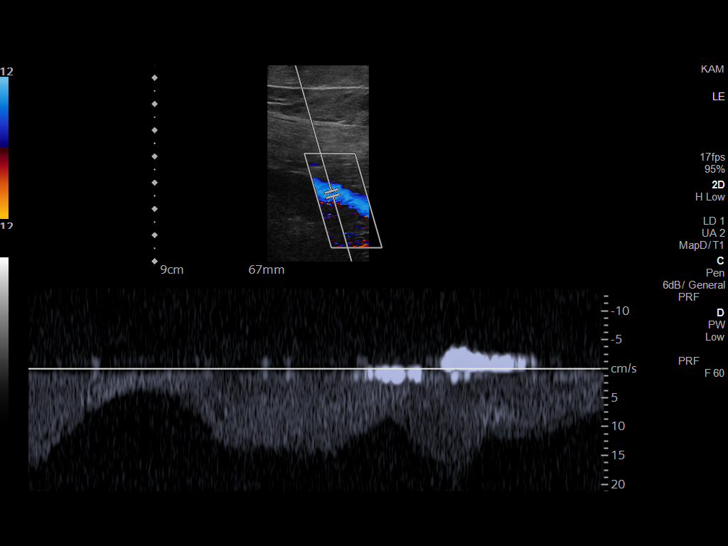
[im 23/76]
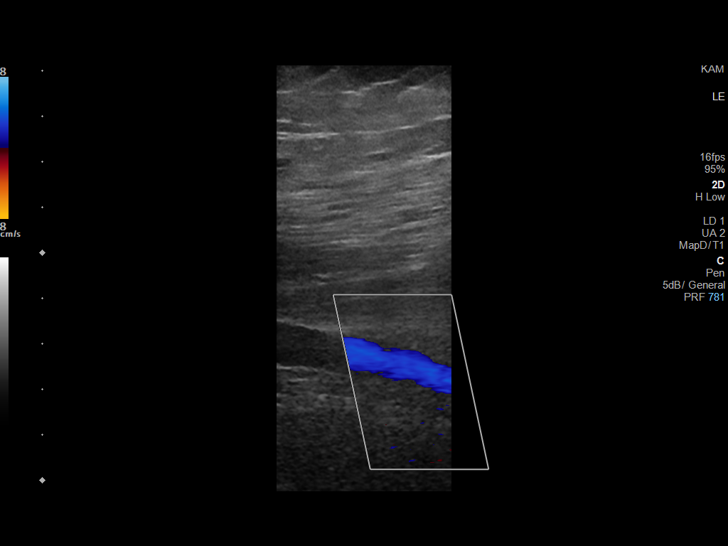
[im 30/76]
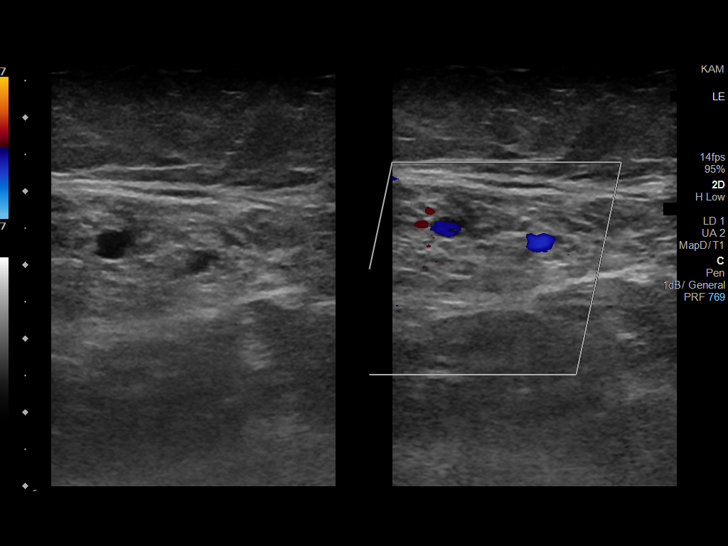
[im 36/76]
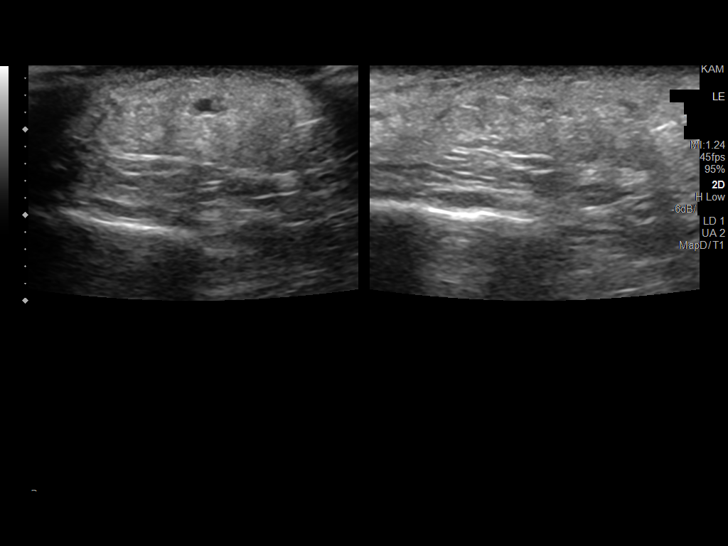
[im 40/76]
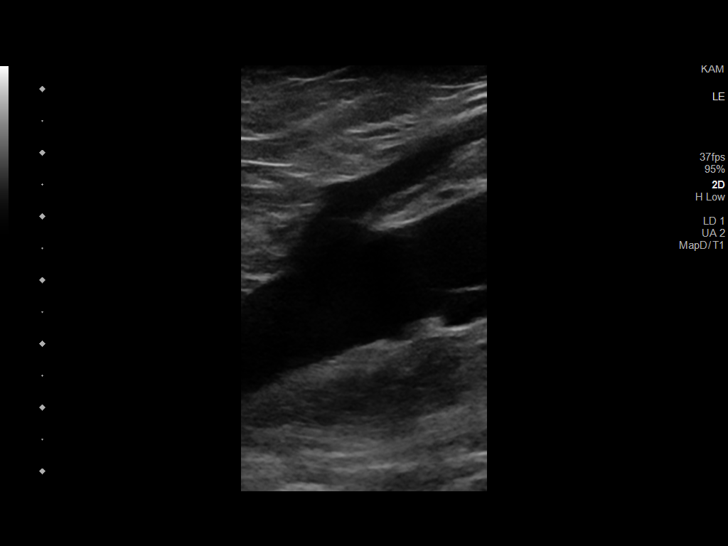
[im 46/76]
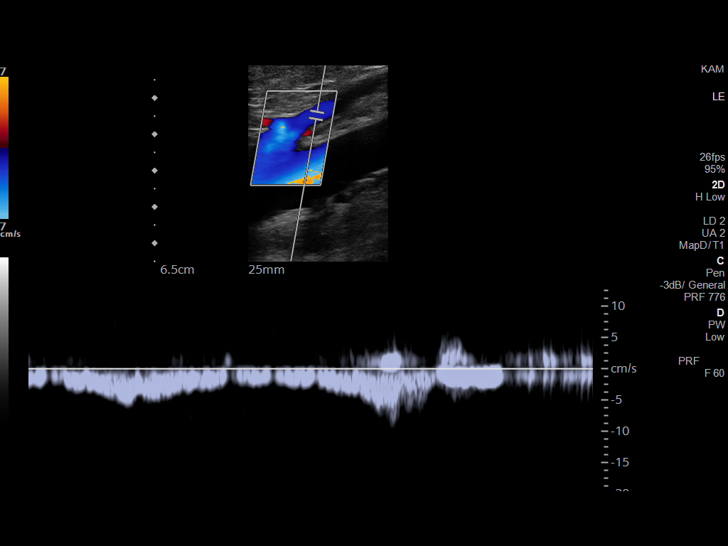
[im 53/76]
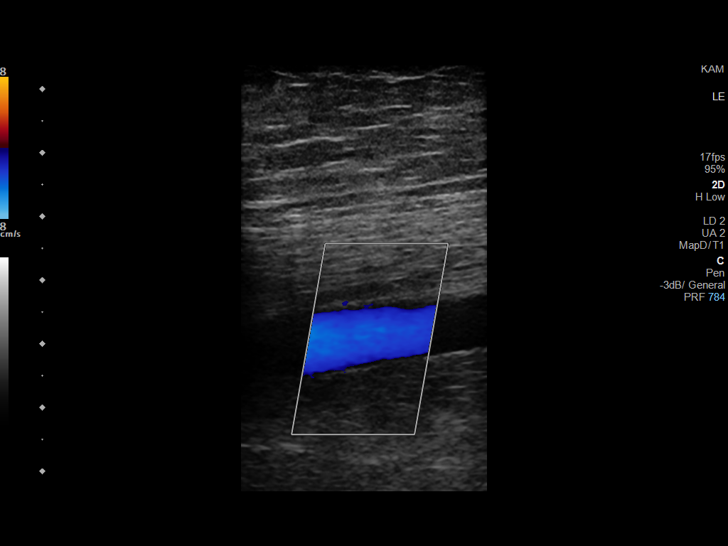
[im 59/76]
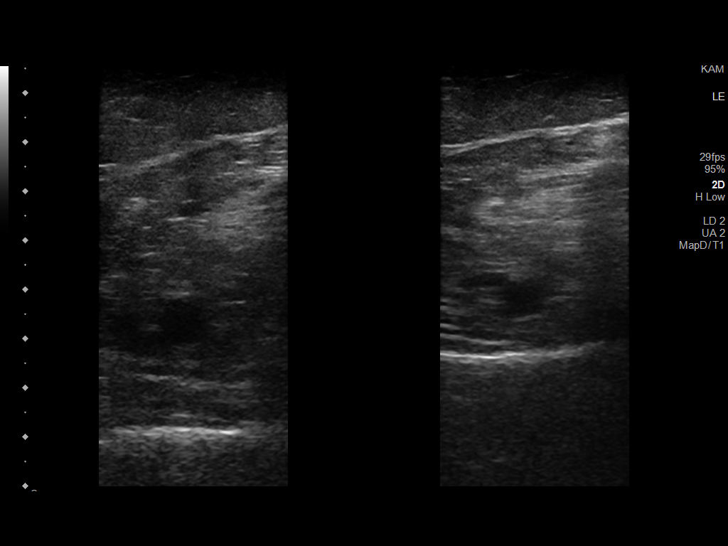
[im 62/76]
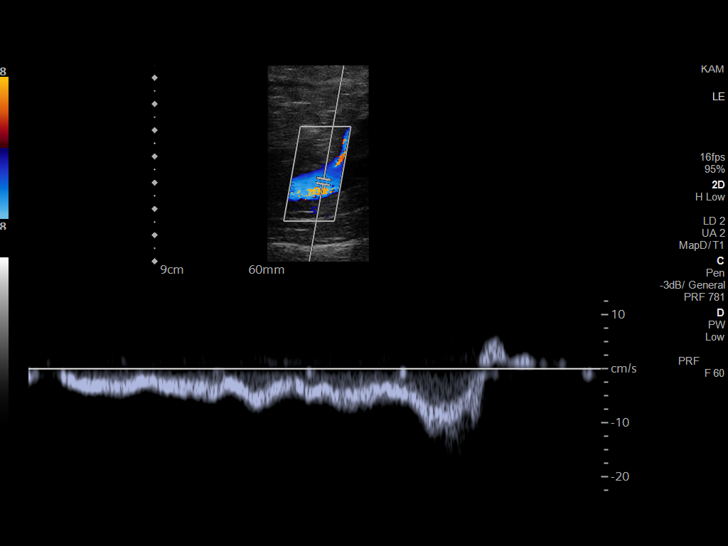
[im 69/76]
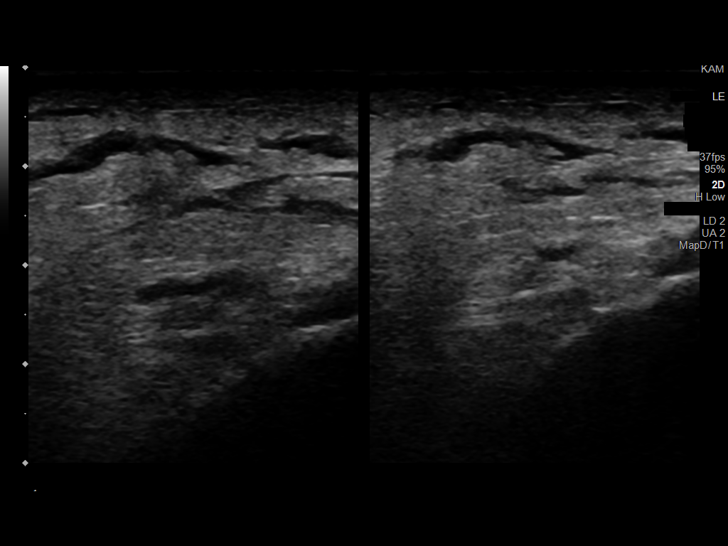
[im 76/76]
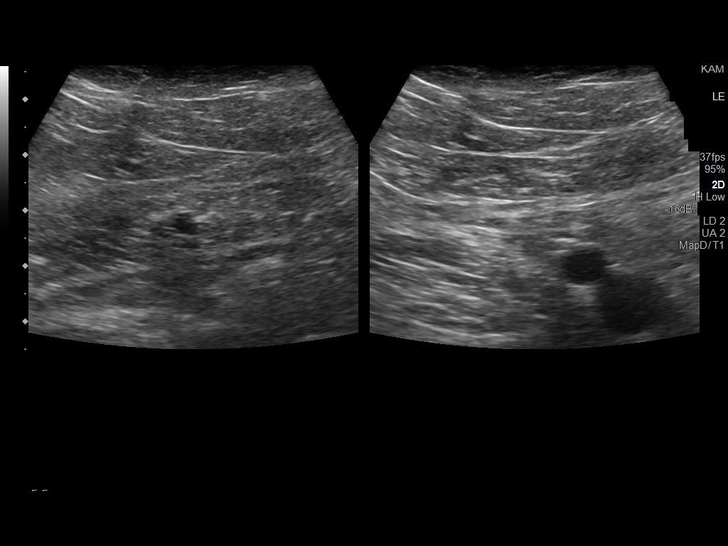

[14 of 24 positions shown; findings below may reference images not displayed]

FINDINGS: VENOUS

Normal compressibility of the common femoral, superficial femoral,
and popliteal veins, as well as the visualized calf veins.
Visualized portions of profunda femoral vein and great saphenous
vein unremarkable. No filling defects to suggest DVT on grayscale or
color Doppler imaging. Doppler waveforms show normal direction of
venous flow, normal respiratory plasticity and response to
augmentation.

Limited views of the contralateral common femoral vein are
unremarkable.

OTHER

Soft tissue edema noted in both calves.

Limitations: none
IMPRESSION: No evidence of a deep venous thrombosis either lower extremity.

## 2020-03-07 MED ORDER — FUROSEMIDE 20 MG PO TABS
20.0000 mg | ORAL_TABLET | Freq: Once | ORAL | Status: AC
  Administered 2020-03-07: 20 mg via ORAL
  Filled 2020-03-07: qty 1

## 2020-03-07 MED ORDER — OXYCODONE-ACETAMINOPHEN 5-325 MG PO TABS: 1.0000 | ORAL_TABLET | ORAL | 0 refills | Status: DC | PRN

## 2020-03-07 MED ORDER — FUROSEMIDE 20 MG PO TABS: 20.0000 mg | ORAL_TABLET | Freq: Every day | ORAL | 0 refills | Status: DC | PRN

## 2020-03-07 MED ORDER — OXYCODONE-ACETAMINOPHEN 5-325 MG PO TABS
2.0000 | ORAL_TABLET | Freq: Once | ORAL | Status: AC
  Administered 2020-03-07: 2 via ORAL
  Filled 2020-03-07: qty 2

## 2020-03-07 NOTE — ED Triage Notes (Signed)
Pt reports bilateral feet numbness for one month. States swelling this week, tender to touch. Right foot feels like "squeezing." Ambulatory with difficulty, cane.

## 2020-03-07 NOTE — ED Provider Notes (Signed)
[facility] [facility] EMERGENCY DEPARTMENT Provider Note   CSN: [id number] Arrival date & time: 03/07/20  1943     History Chief Complaint  Patient presents with  . Foot Pain    Miranda Padilla is a 55 y.o. female.  HPI Patient reports she been having problems with her feet and lower legs for a number of months.  She is aware that she has venous stasis.  She reports that she has been getting numbness on the lateral aspects of her feet and ankles for a while.  She reports however over the past week, her feet have really started to swell.  She reports the amount of swelling that she has is more than typical.  She reports now her feet are very painful and it feels like her foot is almost getting "squeezed off" particularly on the right.  She reports is even difficult for her to walk now.  She reports the right foot is very painful and it feels like she has to almost walk on the toe.  She is using a walker.  Patient does report she often has used a walker historically to get up and move around.  Reports she is had some sciatic and has little bit of pain in her back and into her buttock but it does not seem atypical.  She does not have pain that is radiating all the way down her leg.  Patient reports she has been elevating her legs without much improvement.  She denies she has ever been put on Lasix to help with swelling.  She is aware of her diagnosis of venous stasis.  She denies she is ever had an ultrasound to rule out blood clots in her legs.  She denies any history of blood clots.  Denies she is having any problems with chest pain or shortness of breath.    Past Medical History:  Diagnosis Date  . Abdominal distension   . Abdominal pain   . Anemia   . Anxiety   . Arthritis   . Asthma   . Bronchitis   . Constipation   . Cough   . Depression   . Family history of adverse reaction to anesthesia    Pt mother would wake up during procedures  . GERD (gastroesophageal reflux disease)    . Headache(784.0)   . Nausea & vomiting   . Obsessive compulsive disorder    not on any medication at this time    Patient Active Problem List   Diagnosis Date Noted  . Symptomatic cholelithiasis 01/31/2011  . ALLERGIC RHINITIS 07/02/2008  . HEADACHE, CHRONIC 07/02/2008  . DYSPNEA 07/02/2008  . COUGH 07/02/2008  . OBESITY 07/01/2008  . SICKLE CELL TRAIT 07/01/2008  . OBSESSIVE-COMPULSIVE DISORDER 07/01/2008  . DEPRESSION 07/01/2008    Past Surgical History:  Procedure Laterality Date  . CHOLECYSTECTOMY  02/03/2011   Procedure: LAPAROSCOPIC CHOLECYSTECTOMY;  Surgeon: Harl Bowie, MD;  Location: [place];  Service: General;  Laterality: N/A;  Laparoscopic Choleystectomy  . COLONOSCOPY WITH PROPOFOL N/A 06/01/2016   Procedure: COLONOSCOPY WITH PROPOFOL;  Surgeon: Ronnette Juniper, MD;  Location: [place];  Service: Gastroenterology;  Laterality: N/A;  . MOUTH SURGERY  2000     OB History   No obstetric history on file.     Family History  Problem Relation Age of Onset  . Thrombocytopenia Mother   . Anesthesia problems Mother   . Chronic Renal Failure Mother   . Cancer Maternal Grandmother  breast    Social History   Tobacco Use  . Smoking status: Never Smoker  . Smokeless tobacco: Never Used  Vaping Use  . Vaping Use: Never used  Substance Use Topics  . Alcohol use: No  . Drug use: No    Home Medications Prior to Admission medications   Medication Sig Start Date End Date Taking? Authorizing Provider  ergocalciferol (VITAMIN D2) 1.25 MG (50000 UT) capsule 1 capsule 01/09/19  Yes [provider]  furosemide (LASIX) 20 MG tablet Take 1 tablet (20 mg total) by mouth daily as needed. Daily as needed for swelling of the lower legs 03/07/20  Yes Gerrick Ray, Jeannie Done, MD  oxyCODONE-acetaminophen (PERCOCET) 5-325 MG tablet Take 1-2 tablets by mouth every 4 (four) hours as needed. 03/07/20  Yes Charlesetta Shanks, MD  predniSONE (DELTASONE) 5 MG tablet 1tablets  02/17/19  Yes [provider]  abatacept (ORENCIA) 250 MG injection See admin instructions.    [provider]  albuterol (PROVENTIL HFA;VENTOLIN HFA) 108 (90 BASE) MCG/ACT inhaler Inhale 2 puffs into the lungs every 6 (six) hours as needed. For shortness of breath.     [provider]  folic acid (FOLVITE) 1 MG tablet Take 1 tablet by mouth daily.    [provider]  omeprazole (PRILOSEC) 20 MG capsule Take 1 capsule (20 mg total) by mouth daily. 01/07/11 01/07/12  Riki Altes, MD  ranitidine (ZANTAC) 150 MG tablet Take 150 mg by mouth 2 (two) times daily.    [provider]  zonisamide (ZONEGRAN) 100 MG capsule 1 capsule    [provider]    Allergies    Sulfonamide derivatives  Review of Systems   Review of Systems 10 systems reviewed negative except as per HPI Physical Exam Updated Vital Signs BP 135/75 (BP Location: Left Arm)   Pulse 80   Temp 98.7 F (37.1 C) (Oral)   Resp 16   Wt (!) 139.9 kg   LMP 03/24/2016 (Exact Date)   SpO2 100%   BMI 51.32 kg/m   Physical Exam Constitutional:      Comments: Patient is alert and nontoxic.  Uncomfortable in appearance.  No respiratory distress.  Morbid obesity.  HENT:     Mouth/Throat:     Pharynx: Oropharynx is clear.  Eyes:     Extraocular Movements: Extraocular movements intact.  Cardiovascular:     Rate and Rhythm: Normal rate and regular rhythm.  Pulmonary:     Effort: Pulmonary effort is normal.     Breath sounds: Normal breath sounds.  Abdominal:     General: There is no distension.     Palpations: Abdomen is soft.     Tenderness: There is no abdominal tenderness. There is no guarding.  Musculoskeletal:     Comments: Patient has 2-3+ pitting edema bilateral lower legs.  Upper pigmented skin changes of the anterior lower legs consistent with chronic venous stasis.  No wounds or intense erythema.  Dorsalis pedis pulses are 2+ and strong bilateral feet.  The feet are  warm and dry patient endorses tenderness to squeezing and compression of the calves.  No effusions or swelling of the knees.  No tenderness to palpation at the knees or the soft tissues of the thighs.  Edema is confined to below the knee and lower legs in the feet.  Patient is able to stand and bear weight.  She can walk with antalgic gait.  Patient is able to go from sitting on the stretcher to standing and can  elevate and shift her weight from the ground on the stretcher.  Intact muscular strength by observation of patient's mobility with transitions from stretcher to standing.  She is also able to flex quite well at the lower back and reach down to her toes and ankles from a standing position this does not cause her back or buttock pain.  Neurological:     General: No focal deficit present.     Mental Status: She is oriented to person, place, and time.     Coordination: Coordination normal.     ED Results / Procedures / Treatments   Labs (all labs ordered are listed, but only abnormal results are displayed) Labs Reviewed  BASIC METABOLIC PANEL - Abnormal; Notable for the following components:      Result Value   Potassium 3.4 (*)    Calcium 8.6 (*)    All other components within normal limits  CBC WITH DIFFERENTIAL/PLATELET    EKG None  Radiology US Venous Img Lower Bilateral (DVT)  Result Date: 03/07/2020 CLINICAL DATA:  Bilateral ankle pain and bilateral ankle and feet swelling for 1 month. EXAM: BILATERAL LOWER EXTREMITY VENOUS DOPPLER ULTRASOUND TECHNIQUE: Gray-scale sonography with compression, as well as color and duplex ultrasound, were performed to evaluate the deep venous system(s) from the level of the common femoral vein through the popliteal and proximal calf veins. COMPARISON:  None. FINDINGS: VENOUS Normal compressibility of the common femoral, superficial femoral, and popliteal veins, as well as the visualized calf veins. Visualized portions of profunda femoral vein and  great saphenous vein unremarkable. No filling defects to suggest DVT on grayscale or color Doppler imaging. Doppler waveforms show normal direction of venous flow, normal respiratory plasticity and response to augmentation. Limited views of the contralateral common femoral vein are unremarkable. OTHER Soft tissue edema noted in both calves. Limitations: none IMPRESSION: No evidence of a deep venous thrombosis either lower extremity. Electronically Signed   By: Lajean Manes M.D.   On: 03/07/2020 23:26    Procedures Procedures   Medications Ordered in ED Medications  oxyCODONE-acetaminophen (PERCOCET/ROXICET) 5-325 MG per tablet 2 tablet (2 tablets Oral Given 03/07/20 2352)  furosemide (LASIX) tablet 20 mg (20 mg Oral Given 03/07/20 2351)    ED Course  I have reviewed the triage vital signs and the nursing notes.  Pertinent labs & imaging results that were available during my care of the patient were reviewed by me and considered in my medical decision making (see chart for details).    MDM Rules/Calculators/A&P                          Patient has bilateral pedal pain.  Right greater than left.  She has bilateral peripheral edema.  She endorsed tenderness to compression of the calves.  DVT ruled out by ultrasound.  Patient has easily palpable dorsalis pedis pulses.  Feet are warm and dry.  Do not suspect arterial compromise.  Patient reports some history of sciatica however at this time no indication that her pain is due to sciatica she indicates her pain typically stops at her buttock and she has good range of motion at the back today.  DVT ruled out and normal renal function will opt to trial low-dose of Lasix for peripheral edema.  As patient is having significant pain, will provide a short course of Percocet to use until swelling has improved.  Patient is counseled that she needs close follow-up with her PCP for monitoring  of her response to Lasix and renal function.  Return precautions  reviewed. Final Clinical Impression(s) / ED Diagnoses Final diagnoses:  Peripheral edema  Chronic venous stasis  Bilateral foot pain    Rx / DC Orders ED Discharge Orders         Ordered    oxyCODONE-acetaminophen (PERCOCET) 5-325 MG tablet  Every 4 hours PRN        03/07/20 2345    furosemide (LASIX) 20 MG tablet  Daily PRN        03/07/20 2345           Charlesetta Shanks, MD 03/07/20 2355

## 2020-03-07 NOTE — Discharge Instructions (Addendum)
1.  Take Lasix 20 mg daily for swelling of your lower legs.  Try to elevate your legs as much as possible when you are at rest. 2.  Is very importantly follow-up with your doctor for continued monitoring of your kidney function and response to Lasix. 3.  Return to the emergency department if you develop worsening symptoms, new or concerning symptoms.

## 2020-03-17 ENCOUNTER — Ambulatory Visit (HOSPITAL_COMMUNITY)
Admission: RE | Admit: 2020-03-17 | Discharge: 2020-03-17 | Disposition: A | Discharge status: Home / Self Care | Payer: 59 | Source: Ambulatory Visit | Attending: Neurology | Admitting: Neurology

## 2020-03-17 ENCOUNTER — Ambulatory Visit: Payer: 59 | Admitting: Neurology

## 2020-03-17 ENCOUNTER — Encounter: Payer: Self-pay | Admitting: Neurology

## 2020-03-17 ENCOUNTER — Telehealth: Payer: Self-pay | Admitting: Neurology

## 2020-03-17 VITALS — BP 138/84 | HR 88 | Ht 65.0 in | Wt 315.0 lb

## 2020-03-17 DIAGNOSIS — M25571 Pain in right ankle and joints of right foot: Secondary | ICD-10-CM | POA: Insufficient documentation

## 2020-03-17 DIAGNOSIS — R202 Paresthesia of skin: Secondary | ICD-10-CM

## 2020-03-17 DIAGNOSIS — M5441 Lumbago with sciatica, right side: Secondary | ICD-10-CM

## 2020-03-17 DIAGNOSIS — R600 Localized edema: Secondary | ICD-10-CM | POA: Diagnosis present

## 2020-03-17 DIAGNOSIS — R200 Anesthesia of skin: Secondary | ICD-10-CM | POA: Diagnosis present

## 2020-03-17 DIAGNOSIS — M21371 Foot drop, right foot: Secondary | ICD-10-CM | POA: Diagnosis present

## 2020-03-17 IMAGING — MR MR LUMBAR SPINE WO/W CM
6 of 7 series · 31 of 48 positions shown · IV contrast (gadavist)
Comparison: None.

CLINICAL DATA: Low back pain with numbness and tingling of both
feet. Radiating pain worse on the right.

EXAM:
MRI LUMBAR SPINE WITHOUT AND WITH CONTRAST
TECHNIQUE: Multiplanar and multiecho pulse sequences of the lumbar spine were
obtained without and with intravenous contrast.
CONTRAST:  10mL GADAVIST GADOBUTROL 1 MMOL/ML IV SOLN

[Series 5: T2 · sagittal · 4.0mm · 0.75mm/px · 4 of 17 slices shown (1 of 2)]
[im 1/17]
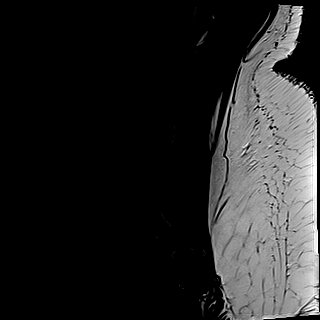
[im 6/17]
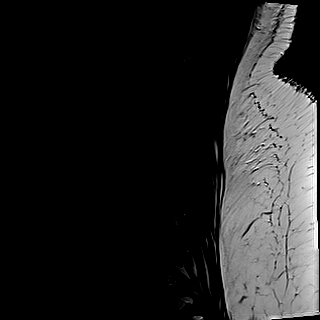
[im 11/17]
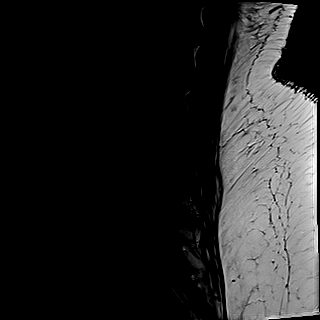
[im 17/17]
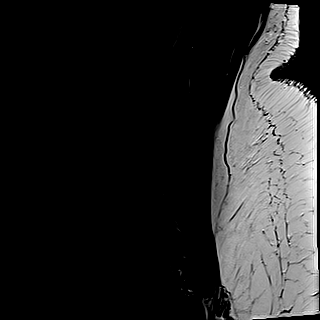

[Series 6: T1 · sagittal · 4.0mm · 0.75mm/px · 4 of 17 slices shown (1 of 2)]
[im 1/17]
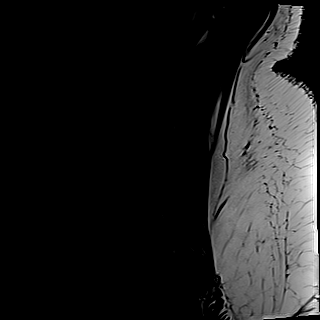
[im 6/17]
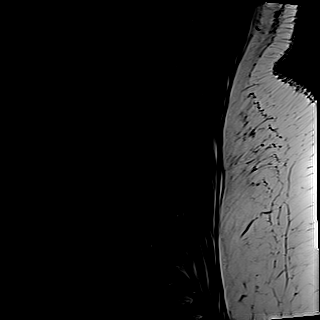
[im 11/17]
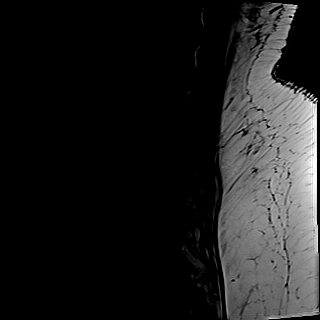
[im 17/17]
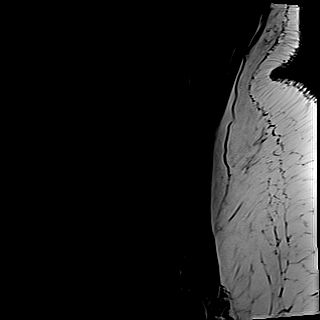

[Series 7: STIR · sagittal · 4.0mm · 0.47mm/px · 2 of 17 slices shown]
[im 1/17]
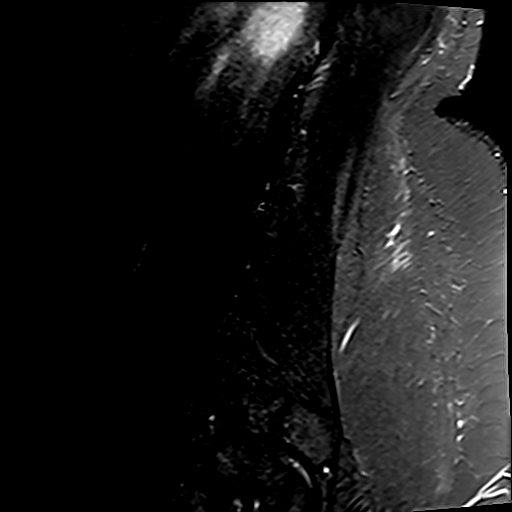
[im 5/17]
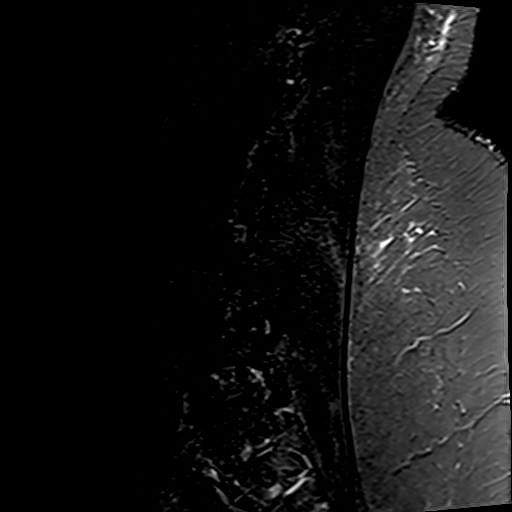

[Series 8: T2 · axial · 4.0mm · 0.62mm/px · z∈[-76,+105]mm · 8 of 36 slices shown (2 of 2)]
[im 1/36]
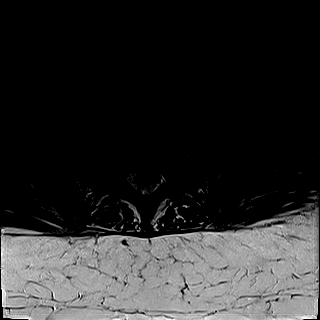
[im 4/36]
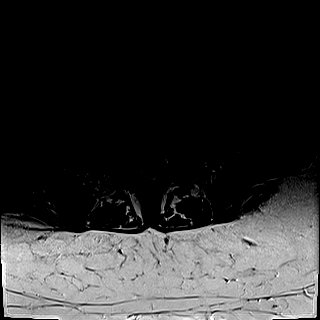
[im 12/36]
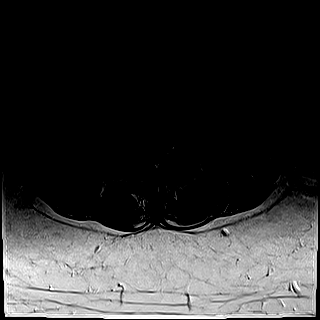
[im 16/36]
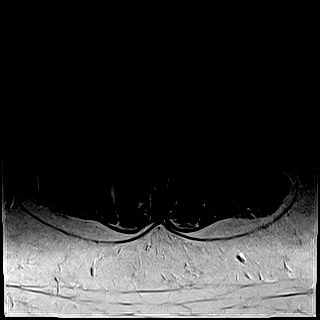
[im 20/36]
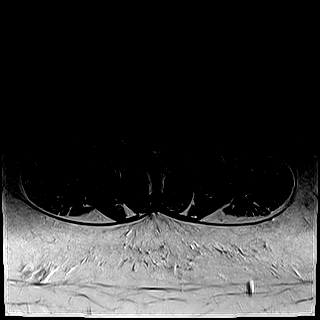
[im 24/36]
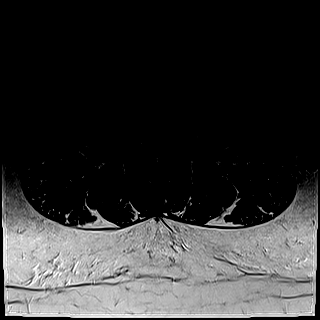
[im 32/36]
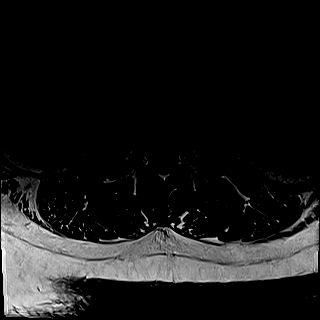
[im 36/36]
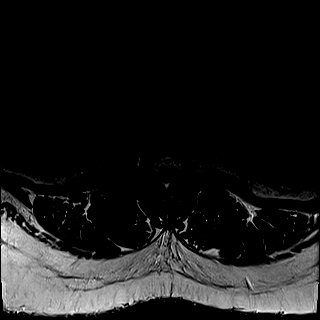

[Series 9: T1 · axial · 4.0mm · 0.39mm/px · z∈[-76,+105]mm · 8 of 36 slices shown (2 of 2)]
[im 1/36]
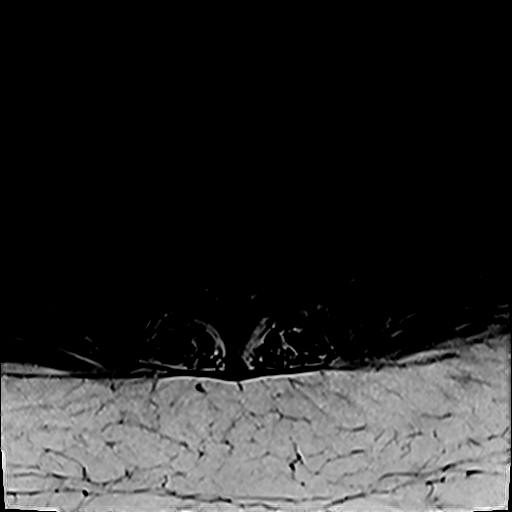
[im 4/36]
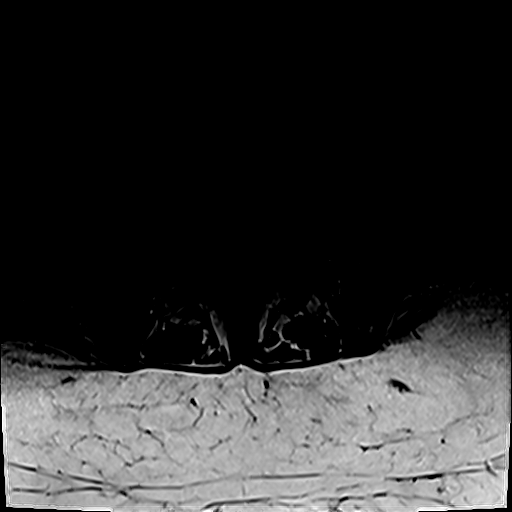
[im 12/36]
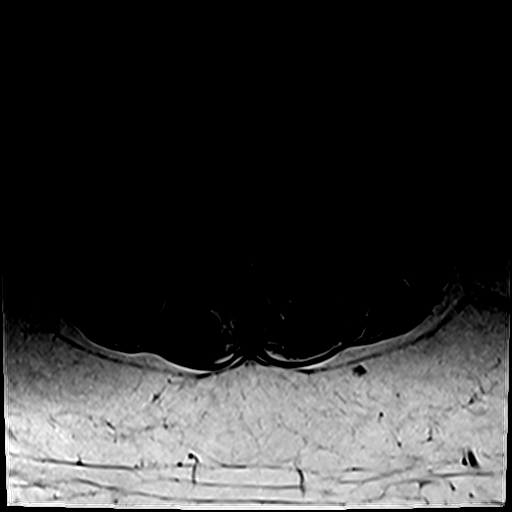
[im 16/36]
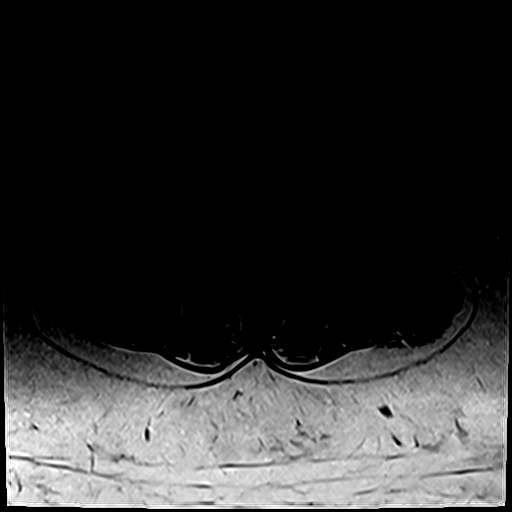
[im 20/36]
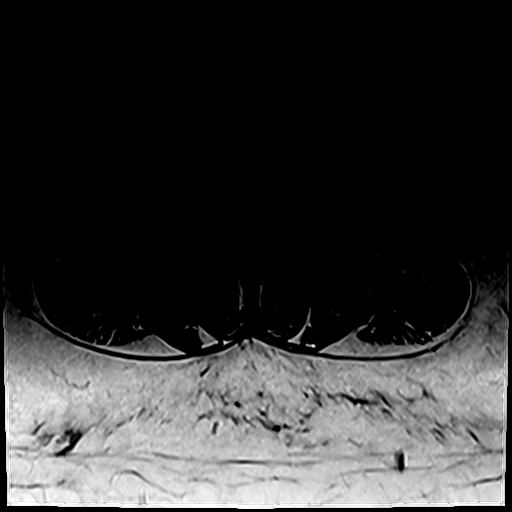
[im 24/36]
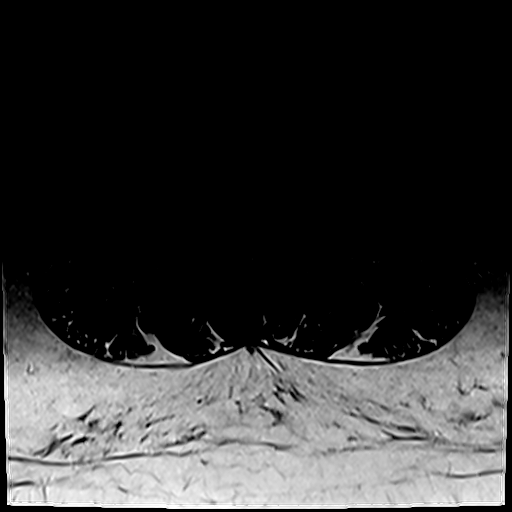
[im 32/36]
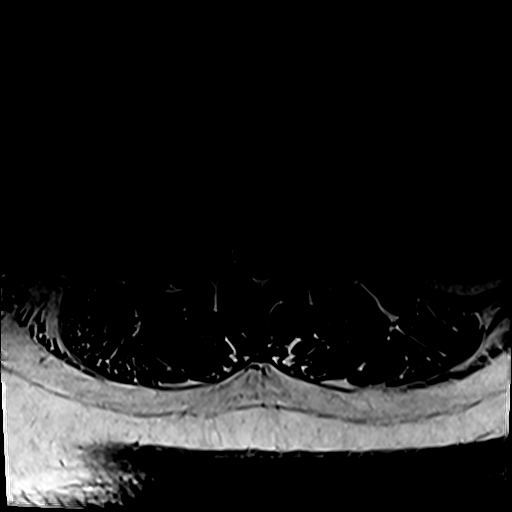
[im 36/36]
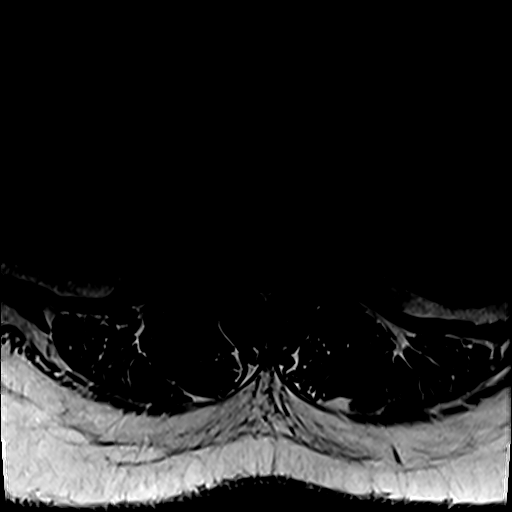

[Series 10: T1 fat-sat post-contrast · sagittal · 4.0mm · 0.75mm/px · 5 of 17 slices shown]
[im 1/17]
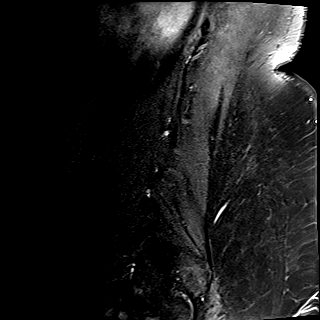
[im 5/17]
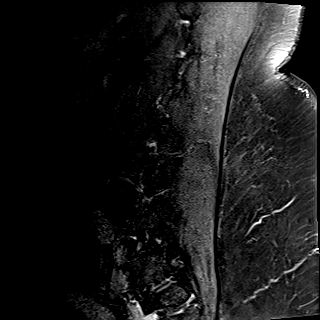
[im 9/17]
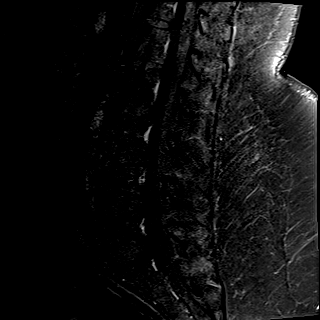
[im 13/17]
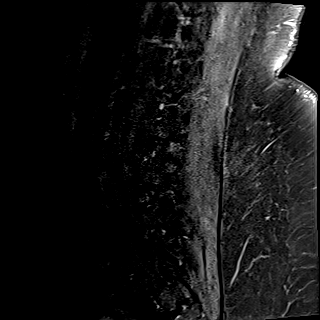
[im 17/17]
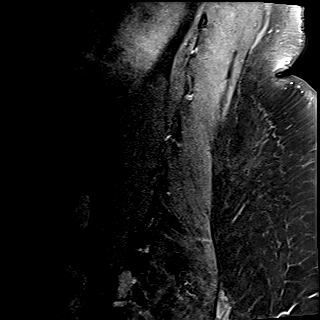

[31 of 48 positions shown; findings below may reference images not displayed]

FINDINGS: Segmentation:  5 lumbar type vertebral bodies.

Alignment:  Normal

Vertebrae:  No fracture or primary bone lesion.

Conus medullaris and cauda equina: Conus extends to the L1-2 level.
Conus and cauda equina appear normal.

Paraspinal and other soft tissues: There is retroperitoneal
adenopathy in the lower periaortic region and in both iliac regions.
This is concerning and could indicate presence of a malignancy. CT
scan of the abdomen and pelvis with contrast is recommended.

Disc levels:

No disc level pathology at T12-L1, L1-2 or L2-3.

L3-4: Desiccation of the disc. Left foraminal herniation adjacent to
the left L3 nerve that could focally affect the left L3 nerve. No
central canal stenosis. There is facet osteoarthritis at this level
with gaping, fluid-filled joints.

L4-5: Mild desiccation of the disc. Mild facet and ligamentous
hypertrophy. No stenosis.

L5-S1: Normal disc. Bilateral facet osteoarthritis which could
contribute to back pain. No neural compression.
IMPRESSION: 1. Lower periaortic and iliac chain adenopathy. This is concerning
for malignancy. CT scan of the abdomen and pelvis with contrast is
recommended.
2. L3-4: Left foraminal to extraforaminal disc herniation adjacent
to the left L3 nerve could focally affect the left L3 nerve.
Bilateral facet osteoarthritis with gaping, fluid-filled joints
which could relate to lumbar region back pain.
3. L4-5: Mild desiccation of the disc. Mild facet osteoarthritis. No
stenosis.
4. L5-S1: Bilateral facet osteoarthritis which could contribute to
back pain. No stenosis.

## 2020-03-17 MED ORDER — GADOBUTROL 1 MMOL/ML IV SOLN
10.0000 mL | Freq: Once | INTRAVENOUS | Status: AC | PRN
  Administered 2020-03-17: 10 mL via INTRAVENOUS

## 2020-03-17 NOTE — Telephone Encounter (Signed)
[place]: [id number] (exp. 03/17/20 to 05/01/20) patient is scheduled at [facility] long for today to arrive at 7:30 pm I spoke with Dorothea Ogle the scheduler for the Hospitals and stated he is waiting on the supervisor to get back with him if the patient can come in sooner today. I informed the patient that if they can get her in sooner that Dorothea Ogle would call her but if not for her to arrive at 7:30 pm.

## 2020-03-17 NOTE — Telephone Encounter (Signed)
Noted, thank you

## 2020-03-17 NOTE — Progress Notes (Signed)
Subjective:    Patient ID: Miranda Padilla is a 55 y.o. female.  HPI     Star Age, MD, PhD [facility] [address] [place], [place] [address]  Dear Dr. Kathlene November,   I saw your patient, Miranda Padilla, upon your kind request in my neurologic clinic today for initial consultation of her paresthesias in the lower extremities. The patient is unaccompanied today. As you know, [name] is a 55 year old right-handed woman with an underlying medical history of irritable bowel syndrome, hyperlipidemia, depression, peripheral edema, migraines, leukocytopenia, rheumatoid arthritis, and severe obesity with a BMI of over 50, who reports numbness and tingling in the feet for the past few weeks.  She states that it started with the right foot at the edge of the right foot.  She had pins-and-needles sensation which suddenly exacerbated about 10 or 12 days ago.  She has had more physical activity as her husband had surgery and she did more physical work.  She has had sudden escalation of right-sided low back pain with radiating pain into the right hip and also her gluteal region feels sore.  She denies any bowel or bladder incontinence or retention.  She has not fallen.  She has had difficulty picking up her right foot.  She feels that it is painful to put weight on it.  She has been checked out in the emergency room because of severe escalation of his pain on 03/07/2020 but feels like she did not have the low back pain component at the time.  She works as an Astronomer.  She is currently not working.  She has been given a prednisone taper by her primary care physician which helped the pain a little bit.   She has a family history of degenerative spine disease in her mom.  She has no family history of neuropathy or other neurological syndromes.  She had recent blood work in January which I was able to review in her chart, ESR was 36 at the time.  I  reviewed your office note from 02/12/2020. Of note, she takes Orencia, methotrexate 2.5 mg strength 4 tablets weekly, and prednisone daily. She has had blood work through your office on 11/12/2019 and I reviewed the results: WBC was 4.6, hematocrit 35.2, MCV 81.5, slightly below normal, glucose 119, chloride 108 which is slightly above normal, carbon dioxide was 19. AST was 12, ALT was 14, ESR was 44. Of note, she follows with a headache specialist in [place]. She had a recent bilateral lower extremity venous Doppler ultrasound through the emergency room in [facility] on 03/07/2020 and I reviewed the results:  Limitations: none   IMPRESSION: No evidence of a deep venous thrombosis either lower extremity.  She does report a history of swelling of her lower extremities and discoloration in her legs.  She typically wears compression stockings.  She has not been wearing them lately.  She had a sleep study in the past, she reports that she does not have obstructive sleep apnea.   She is not sure as to the cause of her lower extremity swelling.  She had an echocardiogram some 3 years ago, none since then.  Her Past Medical History Is Significant For: Past Medical History:  Diagnosis Date  . Abdominal distension   . Abdominal pain   . Anemia   . Anxiety   . Arthritis   . Asthma   . Bronchitis   . Constipation   . Cough   .  Depression   . Family history of adverse reaction to anesthesia    Pt mother would wake up during procedures  . GERD (gastroesophageal reflux disease)   . Headache(784.0)   . Nausea & vomiting   . Obsessive compulsive disorder    not on any medication at this time    Her Past Surgical History Is Significant For: Past Surgical History:  Procedure Laterality Date  . CHOLECYSTECTOMY  02/03/2011   Procedure: LAPAROSCOPIC CHOLECYSTECTOMY;  Surgeon: Harl Bowie, MD;  Location: [place];  Service: General;  Laterality: N/A;  Laparoscopic Choleystectomy  . COLONOSCOPY  WITH PROPOFOL N/A 06/01/2016   Procedure: COLONOSCOPY WITH PROPOFOL;  Surgeon: Ronnette Juniper, MD;  Location: [place];  Service: Gastroenterology;  Laterality: N/A;  . MOUTH SURGERY  2000    Her Family History Is Significant For: Family History  Problem Relation Age of Onset  . Thrombocytopenia Mother   . Anesthesia problems Mother   . Chronic Renal Failure Mother   . Cancer Maternal Grandmother        breast    Her Social History Is Significant For: Social History   Socioeconomic History  . Marital status: Married    Spouse name: Not on file  . Number of children: Not on file  . Years of education: Not on file  . Highest education level: Not on file  Occupational History  . Not on file  Tobacco Use  . Smoking status: Never Smoker  . Smokeless tobacco: Never Used  Vaping Use  . Vaping Use: Never used  Substance and Sexual Activity  . Alcohol use: No  . Drug use: No  . Sexual activity: Yes    Comment: will do preg tedt  Other Topics Concern  . Not on file  Social History Narrative  . Not on file   Social Determinants of Health   Financial Resource Strain: Not on file  Food Insecurity: Not on file  Transportation Needs: Not on file  Physical Activity: Not on file  Stress: Not on file  Social Connections: Not on file    Her Allergies Are:  Allergies  Allergen Reactions  . Sulfonamide Derivatives Itching and Rash    All over the body.  :   Her Current Medications Are:  Outpatient Encounter Medications as of 03/17/2020  Medication Sig  . abatacept (ORENCIA) 250 MG injection See admin instructions.  . ergocalciferol (VITAMIN D2) 1.25 MG (50000 UT) capsule 1 capsule  . folic acid (FOLVITE) 1 MG tablet Take 1 tablet by mouth daily.  . furosemide (LASIX) 20 MG tablet Take 1 tablet (20 mg total) by mouth daily as needed. Daily as needed for swelling of the lower legs  . oxyCODONE-acetaminophen (PERCOCET) 5-325 MG tablet Take 1-2 tablets by mouth every 4 (four)  hours as needed.  . predniSONE (DELTASONE) 5 MG tablet 1tablets  . zonisamide (ZONEGRAN) 100 MG capsule 100 mg 2 (two) times daily.  . [DISCONTINUED] albuterol (PROVENTIL HFA;VENTOLIN HFA) 108 (90 BASE) MCG/ACT inhaler Inhale 2 puffs into the lungs every 6 (six) hours as needed. For shortness of breath.   . [DISCONTINUED] omeprazole (PRILOSEC) 20 MG capsule Take 1 capsule (20 mg total) by mouth daily.  . [DISCONTINUED] ranitidine (ZANTAC) 150 MG tablet Take 150 mg by mouth 2 (two) times daily.   No facility-administered encounter medications on file as of 03/17/2020.  :   Review of Systems:  Out of a complete 14 point review of systems, all are reviewed and negative with the exception  of these symptoms as listed below:   Review of Systems  Neurological:       Here to discuss worsening numbness and tingling in bilateral feet that radiates up her legs. Pt has had Rheumatology work up, she sts she has seen Dr. Deanne Coffer.  Pt reports she has trouble putting weight on her right foot.     Objective:  Neurological Exam  Physical Exam Physical Examination:   Vitals:   03/17/20 0958  BP: 138/84  Pulse: 88  SpO2: 97%   General Examination: The patient is a very pleasant 55 y.o. female in mild distress.  She reports right-sided low back pain with radiating pain to the right hip and right gluteal region, she is in discomfort while sitting and has trouble putting weight on her right foot.  She brought a cane.  She appears well-developed and well-nourished and well groomed.   HEENT: Normocephalic, atraumatic, pupils are equal, round and reactive to light.  She wears corrective eyeglasses.  Extraocular tracking is well preserved, face is symmetric with normal facial animation and normal facial sensation, hearing is grossly intact.  Speech is clear without dysarthria, hypophonia or voice tremor.  She has no hoarseness.  She reports when she takes prednisone her voice gets hoarse.  She has an airway  examination mild mouth dryness, tongue protrudes centrally in palate elevates symmetrically, small airway entry noted.  No carotid bruits.  Chest: Clear to auscultation without wheezing, rhonchi or crackles noted.  Heart: S1+S2+0, regular and normal without murmurs, rubs or gallops noted.   Abdomen: Soft, non-tender and non-distended with normal bowel sounds appreciated on auscultation.  Extremities: There is 1+ pitting edema in the distal lower extremities bilaterally. Pedal pulses are difficult to palpate due to swelling in her feet and ankles.  She is tender in the right ankle.    Skin: Warm and dry with chronic appearing discoloration in the shin areas bilaterally, redness and hyperpigmentation noted.  She has swelling in the lower extremities particularly puffiness noted in both feet.  She is tender in the right ankle joint.    Musculoskeletal: exam reveals pain as described above, tender in the right ankle joint, limited mobility in the right ankle.    Neurologically:   Mental status: The patient is awake, alert and oriented in all 4 spheres. Her immediate and remote memory, attention, language skills and fund of knowledge are appropriate. There is no evidence of aphasia, agnosia, apraxia or anomia. Speech is clear with normal prosody and enunciation. Thought process is linear. Mood is normal and affect is normal.  Cranial nerves II - XII are as described above under HEENT exam. In addition: shoulder shrug is normal with equal shoulder height noted. Motor exam: Normal bulk, strength and tone is noted with the exception of difficult exam of the right foot because she is reporting severe pain.  She has pain limitation in right hip flexor but no telltale weakness.  She has difficulty with right foot dorsiflexion and plantar flexion, unclear how much is a pain component versus actual foot drop. There is no drift, tremor or rebound. Romberg is negative. Reflexes are 1+ in the upper extremities,  trace in the knees, difficult to test the right ankle because of tenderness reported, left ankle trace, toes are downgoing bilaterally but she is tender in the right foot.  Fine motor skills are preserved in the upper extremities, she has decreased mobility in the right ankle.   Cerebellar testing: No dysmetria or intention tremor.  Sensory  exam: intact to light touch, pinprick, vibration, temperature sense in both upper extremities, she has patchy decrease in temperature and pinprick sensation in the left foot, decreased sensation to all modalities in the right foot and increase in sensitivity to touch in the right foot.   Gait, station and balance: She stands with difficulty and reports pain in the right lower back radiating to the right hip and right lateral foot.  She has a single-point cane and walks with a limp briefly.  She reports escalation of the pain when she puts weight on the right foot.   Assessment and Plan:   In summary, Taralyn Padilla is a very pleasant 55 y.o.-year old female with an underlying medical history of irritable bowel syndrome, hyperlipidemia, depression, peripheral edema, migraines, leukocytopenia, rheumatoid arthritis, and severe obesity with a BMI of over 50, who presents for evaluation of her numbness and tingling affecting both feet, started with the right foot some weeks ago.  In the recent 2 weeks she has had significant escalation of pain in the right foot as well as right-sided low back pain with radiating pain into the right hip and right ankle and foot.  Examination is confounded by swelling and pain, she may have mild right foot drop but is complaining of significant discomfort in the right lower back.  I suspect that she has severe right-sided sciatica or even right lumbar herniated disc.  She had been to the emergency room recently but at that time she had more foot pain and more escalation of her swelling and she was suspected to have venous insufficiency  versus DVT.  An ultrasound of the lower extremities was negative for a blood clot.  I believe she needs a lumbar spine MRI on a more urgent basis.  She is advised that we will request an urgent lumbar spine MRI.  In addition, since she does have decreased sensation in both feet, we will also initiate work-up for peripheral neuropathy but her history is more supportive for a acute lower back issue.  We will do some blood work today before she leaves and schedule an EMG nerve conduction velocity testing in our office.  She is advised that should there be a delayed to get her lumbar spine MRI scheduled as an outpatient, and if she has ongoing pain and discomfort or sudden worsening of her symptoms, she will have to go back to the emergency room for emergent evaluation.  We will keep her posted as to her test results by phone call and follow-up in this office accordingly.  If she has a acute lumbar spine issue, it may warrant surgical evaluation and we will make a referral accordingly as well.  Unfortunately, given her medical history, there are multiple factors that may play into her foot and leg discomfort.  She does have acute exacerbation of her right ankle pain, this may tie in with her rheumatoid arthritis as well.  She has chronic appearing changes in the distal lower extremities which may be secondary to chronic insufficiency of the venous system.  She is advised to be mindful that there may be more than 1 issue going on.  I answered all their questions today the patient and her husband were in agreement with the plan.  Thank you very much for allowing me to participate in the care of this nice patient. If I can be of any further assistance to you please do not hesitate to call me at [phone].  Sincerely,   Star Age, MD,  PhD

## 2020-03-17 NOTE — Patient Instructions (Signed)
I am sorry that you have such pain.  Your sudden increase in right foot pain and leg pain may be coming from the back, you may have severe inflammation of the sciatic nerve versus a herniated disc.  In order to investigate this, I am happy to order a lumbar spine MRI and I will try to expedite this.  If you have worsening symptoms before we can get a scan, you will have to go back to the emergency room and explain your symptoms..  They may be able to do an MRI of the lumbar spine through the ER much quicker.  We will also investigate for signs of nerve damage in your feet, what we call peripheral neuropathy.  We will do some blood work for that today and also schedule you for electrical nerve and muscle testing through our office.  I do believe that you need to be evaluated for a lumbar spine degenerative disc disease first.  We will call you with your test results to keep you posted.  Again, if you have sudden escalation of your pain or there is a delay in getting an approval for your lumbar spine MRI, you will have to go to the emergency room, I am afraid.

## 2020-03-17 NOTE — Telephone Encounter (Signed)
Just an FYI

## 2020-03-18 ENCOUNTER — Telehealth: Payer: Self-pay | Admitting: Neurology

## 2020-03-18 DIAGNOSIS — M5431 Sciatica, right side: Secondary | ICD-10-CM

## 2020-03-18 DIAGNOSIS — R590 Localized enlarged lymph nodes: Secondary | ICD-10-CM

## 2020-03-18 DIAGNOSIS — M545 Low back pain, unspecified: Secondary | ICD-10-CM

## 2020-03-18 NOTE — Telephone Encounter (Signed)
I called the listed home phone number and was able to speak with Miranda Padilla.  I explained the findings on her lumbar spine MRI with and without contrast from 03/17/2020 in detail.  She is advised that degenerative changes were seen, L3-4 left foraminal and extraforaminal disc herniation was explained to her which could affect her back pain and also left leg pain, nothing to explain her severe right sided pain and swelling.  She does have mild arthritis in multiple levels.  No severe lumbar spinal stenosis.  I explained to her that there were lymph node swelling seen along the lower aorta and iliac arteries.  This needs to be investigated further and a CT scan of the abdomen and pelvis with contrast was recommended.  She is advised to call her primary care physician today and see about getting in for an evaluation for potential underlying causes including malignancy, I would recommend CT scanning on an urgent basis, possibly even chest, abdomen and pelvis.  She is advised that we can seek evaluation through neurosurgery for help with her back pain and sciatica symptoms, perhaps nonsurgical approach such as injection may be feasible.  She is agreeable to this and I will place a referral to neurosurgery.    Miranda Padilla, please expedite referral if you can to [facility] neurosurgical group.  Patient is agreeable to pursuing the EMG which is scheduled per patient for next week but I do not see it on the actual schedule? Miranda Padilla, can you look into it?

## 2020-03-18 NOTE — Telephone Encounter (Signed)
Noted   I will get referral set up Now . Thanks Matthew Folks, please expedite referral

## 2020-03-18 NOTE — Telephone Encounter (Signed)
Pt was called on 03/17/20 and scheduled for EMG with Dr. Felecia Shelling for 03/23/2020.

## 2020-03-19 ENCOUNTER — Other Ambulatory Visit: Payer: Self-pay | Admitting: Family Medicine

## 2020-03-19 ENCOUNTER — Other Ambulatory Visit: Payer: Self-pay

## 2020-03-19 ENCOUNTER — Ambulatory Visit
Admission: RE | Admit: 2020-03-19 | Discharge: 2020-03-19 | Disposition: A | Discharge status: Home / Self Care | Payer: 59 | Source: Ambulatory Visit | Attending: Family Medicine | Admitting: Family Medicine

## 2020-03-19 DIAGNOSIS — K6819 Other retroperitoneal abscess: Secondary | ICD-10-CM

## 2020-03-19 IMAGING — CT CT ABD-PELV W/ CM
1 of 3 series · 13 of 32 positions shown, 19 images · IV contrast (APPLIED)
Comparison: MRI lumbar spine [DATE]

CLINICAL DATA: Eval for Retroperitoneal abscess. Hx of gb.
Cancer-none.

EXAM:
CT ABDOMEN AND PELVIS WITH CONTRAST
TECHNIQUE: Multidetector CT imaging of the abdomen and pelvis was performed
using the standard protocol following bolus administration of
intravenous contrast.
CONTRAST:  100mL [Z7] IOPAMIDOL ([Z7]) INJECTION 61%

[Series 2: abd/pelvis w/cm · axial · 0.98mm/px · z∈[-478,-53]mm · 13 of 101 slices shown, 19 images]
[im 8/101  soft-tissue]
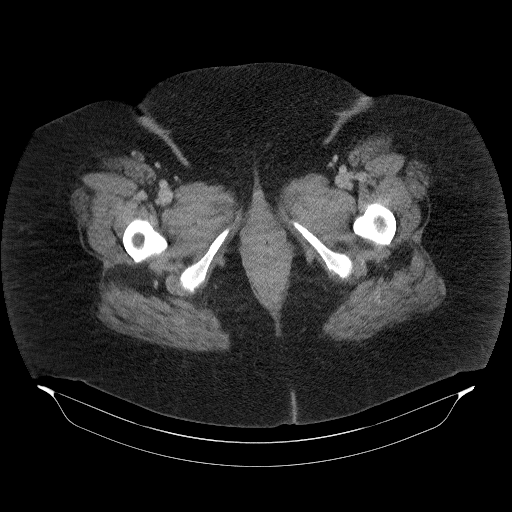
[im 8/101  bone]
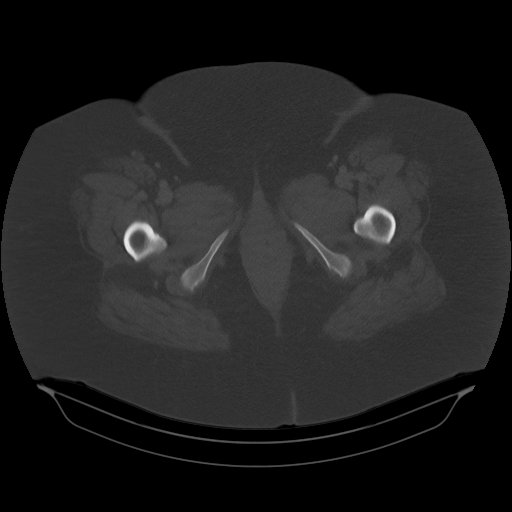
[im 15/101  soft-tissue]
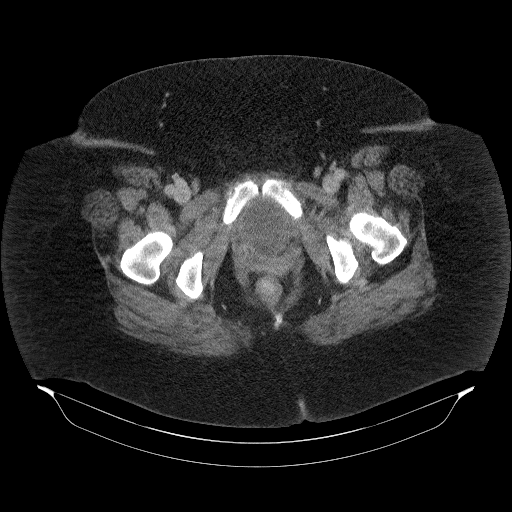
[im 22/101  soft-tissue]
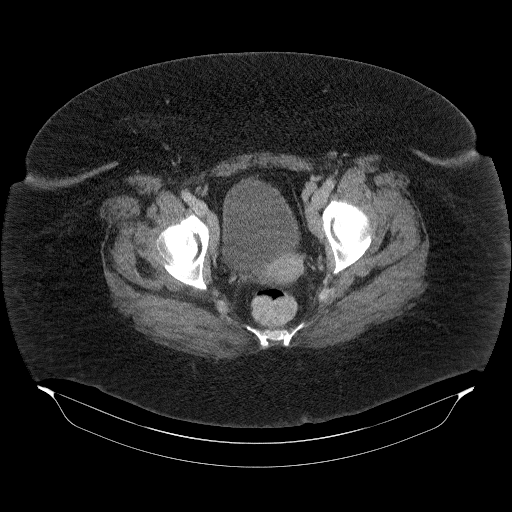
[im 29/101  soft-tissue]
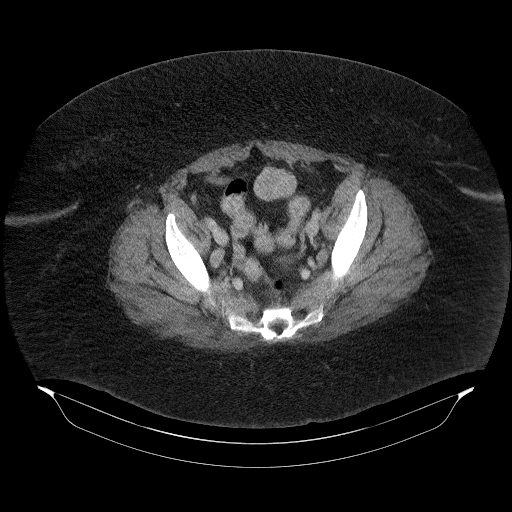
[im 36/101  soft-tissue]
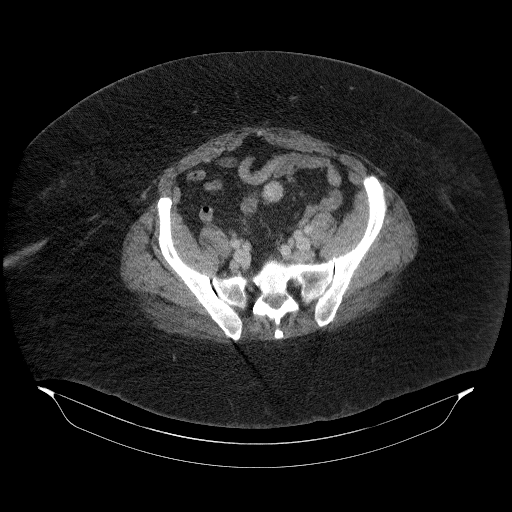
[im 43/101  soft-tissue]
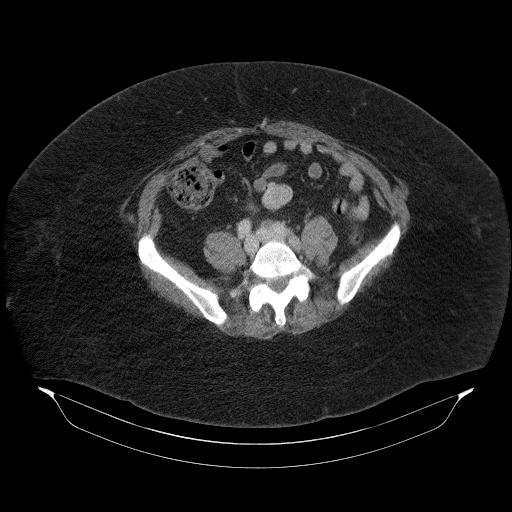
[im 51/101  soft-tissue]
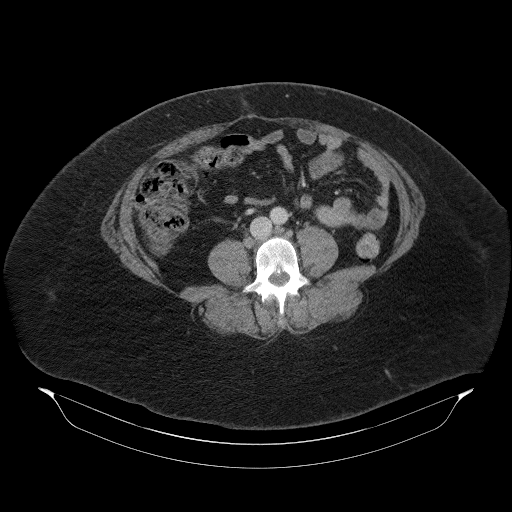
[im 58/101  soft-tissue]
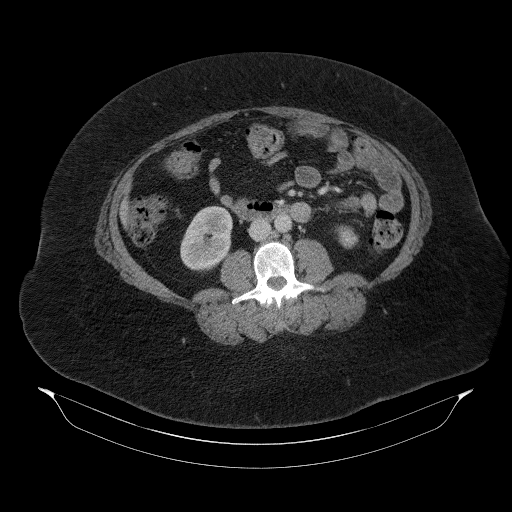
[im 65/101  soft-tissue]
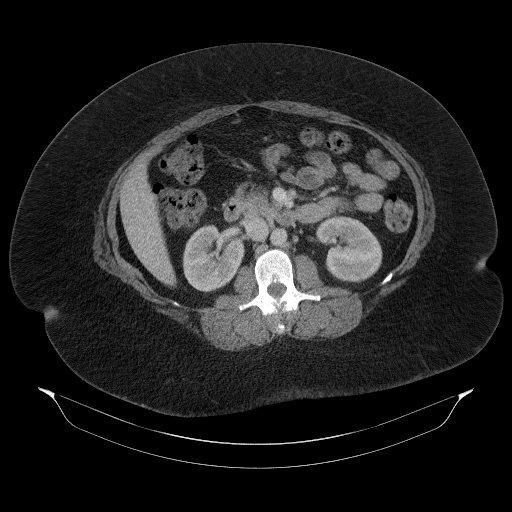
[im 65/101  bone]
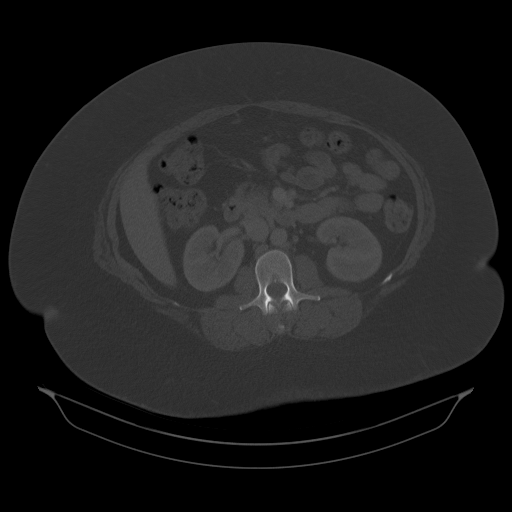
[im 72/101  soft-tissue]
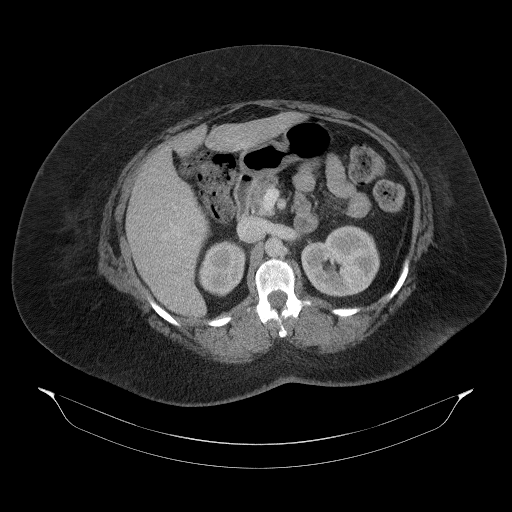
[im 72/101  lung]
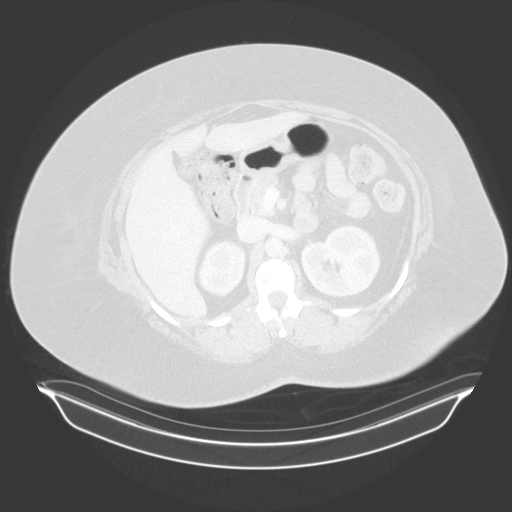
[im 79/101  soft-tissue]
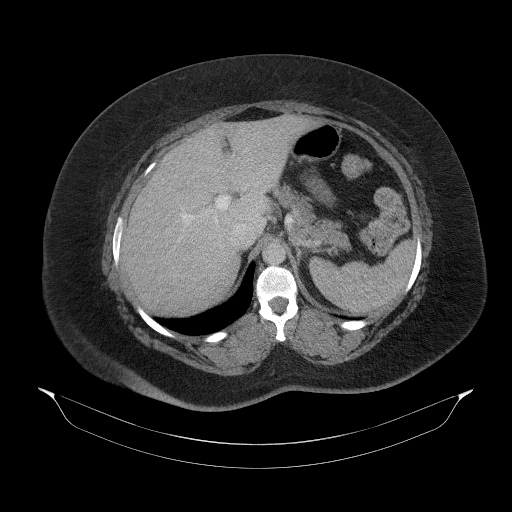
[im 79/101  lung]
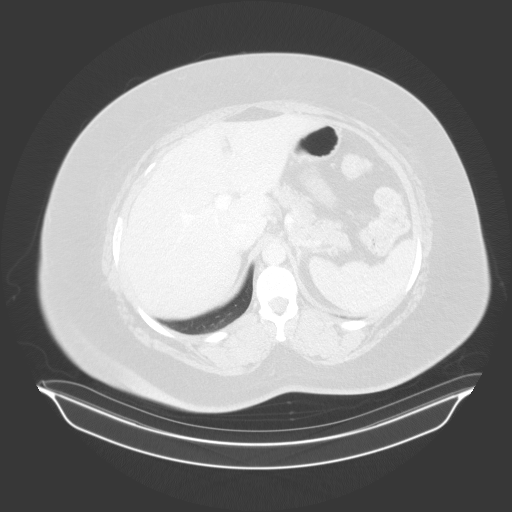
[im 86/101  soft-tissue]
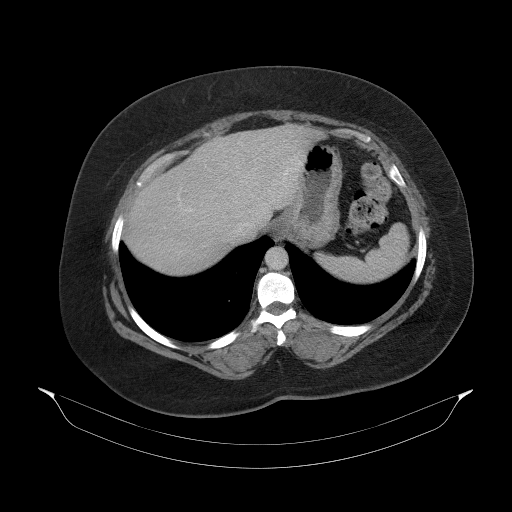
[im 86/101  lung]
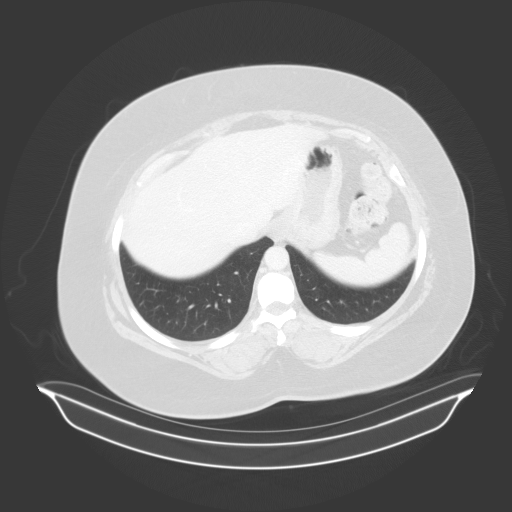
[im 93/101  soft-tissue]
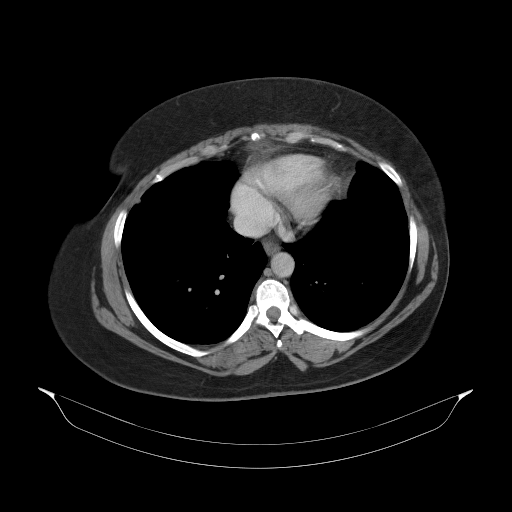
[im 93/101  lung]
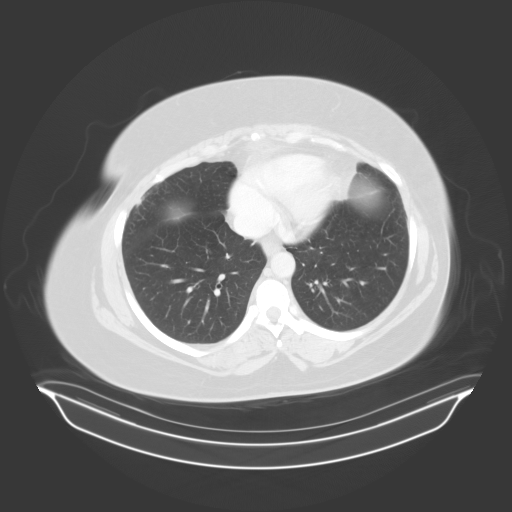

[13 of 32 positions shown; findings below may reference images not displayed]

FINDINGS: Lower chest: No acute abnormality.

Hepatobiliary: No focal liver abnormality is seen. Status post
cholecystectomy. No biliary dilatation.

Pancreas: No focal lesion. Normal pancreatic contour. No surrounding
inflammatory changes. No main pancreatic ductal dilatation.

Spleen: Normal in size without focal abnormality.

Adrenals/Urinary Tract: No adrenal nodule bilaterally. Bilateral
kidneys enhance symmetrically. No hydronephrosis. No hydroureter.
The urinary bladder is unremarkable. On delayed imaging, there is no
urothelial wall thickening and there are no filling defects in the
opacified portions of the bilateral collecting systems or ureters.

Stomach/Bowel: Stomach is within normal limits. No evidence of bowel
wall thickening or dilatation. The appendix not definitely
identified.

Vascular/Lymphatic: No abdominal aorta or iliac aneurysm. Mild
atherosclerotic plaque of the aorta and its branches. Nonspecific
borderline enlarged retroperitoneal lymph nodes distally near the
aortic bifurcation. Nonspecific borderline enlarged pelvic lymph
nodes.

Reproductive: Uterus and bilateral adnexa are unremarkable.

Other: No intraperitoneal free fluid. No intraperitoneal free gas.
No organized fluid collection.

Musculoskeletal:

No abdominal wall hernia or abnormality.

No suspicious lytic or blastic osseous lesions. No acute displaced
fracture.
IMPRESSION: Nonspecific borderline enlarged distal retroperitoneal and pelvic
lymph nodes. A malignancy or lymphoproliferative disorder cannot be
excluded. Comparison with prior cross-sectional imaging would be of
value. Recommend attention on follow-up.

## 2020-03-19 MED ORDER — IOPAMIDOL (ISOVUE-300) INJECTION 61%
100.0000 mL | Freq: Once | INTRAVENOUS | Status: AC | PRN
  Administered 2020-03-19: 100 mL via INTRAVENOUS

## 2020-03-23 ENCOUNTER — Encounter: Payer: 59 | Admitting: Neurology

## 2020-03-23 ENCOUNTER — Ambulatory Visit (INDEPENDENT_AMBULATORY_CARE_PROVIDER_SITE_OTHER): Payer: 59 | Admitting: Neurology

## 2020-03-23 ENCOUNTER — Other Ambulatory Visit: Payer: Self-pay

## 2020-03-23 DIAGNOSIS — M25571 Pain in right ankle and joints of right foot: Secondary | ICD-10-CM

## 2020-03-23 DIAGNOSIS — R200 Anesthesia of skin: Secondary | ICD-10-CM

## 2020-03-23 DIAGNOSIS — M5441 Lumbago with sciatica, right side: Secondary | ICD-10-CM

## 2020-03-23 DIAGNOSIS — M21371 Foot drop, right foot: Secondary | ICD-10-CM

## 2020-03-23 DIAGNOSIS — Z0289 Encounter for other administrative examinations: Secondary | ICD-10-CM

## 2020-03-23 DIAGNOSIS — R202 Paresthesia of skin: Secondary | ICD-10-CM | POA: Diagnosis not present

## 2020-03-23 DIAGNOSIS — R600 Localized edema: Secondary | ICD-10-CM

## 2020-03-23 LAB — B12 AND FOLATE PANEL
Folate: 19.7 ng/mL (ref >3.0)
Vitamin B-12: 596 pg/mL (ref 232–1245)

## 2020-03-23 LAB — RPR: RPR Ser Ql: NONREACTIVE

## 2020-03-23 LAB — VITAMIN B6: Vitamin B6: 8.2 ug/L (ref 2.0–32.8)

## 2020-03-23 LAB — CK: Total CK: 78 U/L (ref 32–182)

## 2020-03-23 NOTE — Progress Notes (Signed)
Full Name: Miranda Padilla Gender: Female MRN #: [id number] Date of Birth: 01-22-1966    Visit Date: 03/23/2020 07:23 Age: 55 Years Examining Physician: Arlice Colt, MD  Referring Physician: Star Age, MD    History: [name] is a 55 year old woman with right greater than left leg weakness and numbness.  The symptoms have progressed over 6 months.  Additionally, she has severe swelling in both legs that has worsened over the past couple months.  On examination, she had altered sensation in the right lateral foot more than the dorsum of the foot.  She also reported numbness in the second and third toes on the left and in both shins in a nondermatome or neurotome distribution.  Strength was 4+/5 in toe and ankle extension on the right.  Nerve conduction studies: Bilateral peroneal and tibial motor responses had normal distal latencies, amplitudes and conduction velocities.  The tibial F-wave latencies were normal.  Sensory NCV's were difficult due to swelling in the foot.  In this context, the right sural sensory response had normal peak latency and amplitude.  The left sural and left superficial peroneal sensory responses had normal peak latencies with reduced amplitude in the right superficial peroneal sensory response was absent.  Electromyography: Needle EMG of selected muscles of both legs was performed.  In the right leg, there was mild chronic/acute denervation in the gastrocnemius muscle and mild chronic denervation in the gluteus medius muscle.  The foot muscles showed some polyphasic motor units though the recruitment pattern was normal.  In the left leg, several muscles had a small number of polyphasic motor units but recruitment was normal.  Impression: This NCV/EMG study shows the following: 1.   There was no evidence of any significant polyneuropathy though a minimal axonal sensory polyneuropathy cannot be completely excluded..  The mild reduced amplitude  noted in 2 of the sensory responses and another absent sensory response were more likely related to the extent of edema in the feet. 2.    Possible right S1 radiculopathy.  Delrico Minehart A. Felecia Shelling, MD, PhD, FAAN Certified in Neurology, Clinical Neurophysiology, Sleep Medicine, Pain Medicine and Neuroimaging Director, [place] [facility] [address], [place], [place] [address] [phone]        Verbal informed consent was obtained from the patient, patient was informed of potential risk of procedure, including bruising, bleeding, hematoma formation, infection, muscle weakness, muscle pain, numbness, among others.        [place]    Nerve / Sites Muscle Latency Ref. Amplitude Ref. Rel Amp Segments Distance Velocity Ref. Area    ms ms mV mV %  cm m/s m/s mVms  R Peroneal - EDB     Ankle EDB 3.1 ?6.5 3.0 ?2.0 100 Ankle - EDB 9   9.4     Fib head EDB 7.9  1.5  50.3 Fib head - Ankle 25 52 ?44 4.6     Pop fossa EDB 9.8  2.4  159 Pop fossa - Fib head 10 53 ?44 7.4         Pop fossa - Ankle      L Peroneal - EDB     Ankle EDB 2.9 ?6.5 2.6 ?2.0 100 Ankle - EDB 9   9.8     Fib head EDB 7.7  2.0  78.2 Fib head - Ankle 24 50 ?44 6.3     Pop fossa EDB 9.7  2.3  115 Pop fossa - Fib head 10 50 ?44 7.5         Pop fossa - Ankle      R Tibial - AH     Ankle AH 3.0 ?5.8 4.6 ?4.0 100 Ankle - AH 9   7.9     Pop fossa AH 11.5  1.5  33.3 Pop fossa - Ankle 35 41 ?41 3.4  L Tibial - AH     Ankle AH 4.4 ?5.8 4.1 ?4.0 100 Ankle - AH 9   7.2     Pop fossa AH 11.3  4.8  116 Pop fossa - Ankle 34 49 ?41 8.8             SNC    Nerve / Sites Rec. Site Peak Lat Ref.  Amp Ref. Segments Distance    ms ms V V  cm  R Sural - Ankle (Calf)     Calf Ankle 3.5 ?4.4 6 ?6 Calf - Ankle 14  L Sural - Ankle (Calf)     Calf Ankle 3.9 ?4.4 3 ?6 Calf - Ankle 14  R Superficial peroneal - Ankle     Lat leg Ankle NR ?4.4 NR ?6 Lat leg - Ankle 14   L Superficial peroneal - Ankle     Lat leg Ankle 2.9 ?4.4 2 ?6 Lat leg - Ankle 12             F  Wave    Nerve F Lat Ref.   ms ms  R Tibial - AH 50.2 ?56.0  L Tibial - AH 50.9 ?56.0         EMG Summary Table    Spontaneous MUAP Recruitment  Muscle IA Fib PSW Fasc Other Amp Dur. Poly Pattern  R. Vastus medialis Normal None None None _______ Normal Normal Normal Normal  R. Peroneus longus Normal None None None _______ Normal Normal Normal Normal  R. Tibialis anterior Normal None None None _______ Normal Increased 1+ Normal  R. Gastrocnemius (Medial head) Normal 1+ 1+ None _______ Increased Increased 1+ Reduced  R. Abductor hallucis Normal None None None _______ Normal Increased 1+ Normal  R. Extensor digitorum brevis Normal None None None _______ Normal Increased 1+ Normal  R. Iliopsoas Normal None None None _______ Normal Normal Normal Normal  R. Gluteus medius Normal None None None _______ Normal Normal 1+ Reduced  L. Vastus medialis Normal None None None _______ Normal Normal Normal Normal  L. Peroneus longus Normal None None None _______ Normal Normal Normal Normal  L. Tibialis anterior Normal None None None _______ Normal Normal Normal Normal  L. Gastrocnemius (Medial head) Normal None None None _______ Normal Normal 1+ Normal  L. Abductor hallucis Normal None None None _______ Normal Normal 1+ Normal

## 2020-03-23 NOTE — Progress Notes (Signed)
Please call patient and advise her that her labs were unremarkable.  Please inquire if she has been scheduled with the spine surgeon yet.  We will call with results of the EMG and nerve conduction velocity test.

## 2020-03-25 ENCOUNTER — Telehealth: Payer: Self-pay | Admitting: Neurology

## 2020-03-25 ENCOUNTER — Telehealth: Payer: Self-pay

## 2020-03-25 NOTE — Telephone Encounter (Signed)
-----   Message from Star Age, MD sent at 03/23/2020  4:57 PM EST ----- Please call patient and advise her that her labs were unremarkable.  Please inquire if she has been scheduled with the spine surgeon yet.  We will call with results of the EMG and nerve conduction velocity test.

## 2020-03-25 NOTE — Telephone Encounter (Signed)
I received an office note from Dr. Duffy Rhody, neurosurgery, patient had a visit on 03/19/2020.  Patient was felt to have significant pain and allodynia out of proportion to her structural back disease.  He felt that there is no structural spine disease contributing to her neuropathic pain.  She was given a prescription for gabapentin and advised to work on weight loss.  She was advised to return for flexion-extension x-ray and possible candidacy for facet injection at the right elbow 3-4.  This is for reference.  Please call patient and advise her that at this juncture, I would recommend that she follow-up closely with her primary care physician, especially with regards to the lymph node swelling that was seen around the aortic artery in the abdomen.  Thankfully, from our nerve and muscle testing and blood work, there is no evidence of widespread nerve damage.  I would also recommend that she follow-up with her rheumatologist as scheduled.

## 2020-03-25 NOTE — Telephone Encounter (Signed)
Please call patient and advise her that her electrical nerve and muscle test on 03/23/2020 showed no significant polyneuropathy, meaning nerve damage.  The extent of the swelling made it is somewhat challenging test.  There is possibility that she has a pinched nerve on the right side in the back.  This would correlate with the degenerative changes in her lower back found on the lumbar spine MRI.

## 2020-03-25 NOTE — Telephone Encounter (Signed)
I called patient, and discussed lab results.  Patient verbalized understanding, and states she has not been scheduled with a spine specialist yet.  She reports she has not received a call in regards to this.  Patient had nerve conduction study on 03/23/20.  I advised to referral coordinator in regards to spine specialist referral.

## 2020-03-25 NOTE — Telephone Encounter (Signed)
I called the pt and discussed Dr. Guadelupe Sabin recommendations. She verbalized understanding.

## 2020-03-25 NOTE — Telephone Encounter (Signed)
I called pt and advised of results of NCS/EMG study. Pt verbalized understanding. She had no questions/concerns at the time of the call.

## 2020-04-01 ENCOUNTER — Telehealth: Payer: Self-pay | Admitting: Neurology

## 2020-04-01 NOTE — Telephone Encounter (Signed)
I spoke with Dr. Rexene Alberts verbally on this message.  Dr. Rexene Alberts recommends that the patient follow-up with her primary care, neurosurgeon, or rheumatology for the tingling she is having in her ankles. Neurosurgery was the one who started the patient on gabapentin. Dr. Rexene Alberts states that tingling coming from the ankles could be from arthritis for sweliing.  I called pt and relayed that she should f/u with PCP/ rheumatologist. She was agreeable to note she reports she called neurosurgery and was advised to call our office about the gabapentin dosage. I advised Dr. Rexene Alberts did not think the gabapentin is the best route of treatment and PCP and rheumatology should be consulted to determine the root of the problem Pt verbalized understanding.

## 2020-04-01 NOTE — Telephone Encounter (Signed)
Pt called stating that she is having tingling in her ankles and is wanting to know if there is something she can take for the discomfort .

## 2020-04-12 ENCOUNTER — Telehealth: Payer: Self-pay | Admitting: *Deleted

## 2020-04-12 NOTE — Telephone Encounter (Signed)
Per referral Dr. Leonie Green gave upcoming appointments

## 2020-04-19 ENCOUNTER — Telehealth: Payer: Self-pay | Admitting: Neurology

## 2020-04-19 NOTE — Telephone Encounter (Signed)
Pt states the situation with her right foot is now happening with her left, pt is asking for a call to know if she should wait it out or if there is another suggestion

## 2020-04-19 NOTE — Telephone Encounter (Signed)
Thank you, I see that she has an upcoming appointment with oncology as well.  Nothing further needed.

## 2020-04-19 NOTE — Telephone Encounter (Addendum)
I discussed message with Dr. Rexene Alberts.  Dr. Rexene Alberts recommends based off the testing we have done that a second opinion would be beneficial.    I called pt and advised of Dr. Guadelupe Sabin recommendation. She verbalized understanding and will speak with PCP about second opinion referral.

## 2020-05-11 ENCOUNTER — Other Ambulatory Visit: Payer: 59

## 2020-05-11 ENCOUNTER — Ambulatory Visit: Payer: 59 | Admitting: Family

## 2020-05-18 ENCOUNTER — Ambulatory Visit: Payer: 59 | Admitting: Neurology

## 2020-05-21 ENCOUNTER — Other Ambulatory Visit: Payer: Self-pay

## 2020-05-21 ENCOUNTER — Other Ambulatory Visit: Payer: Self-pay | Admitting: Family

## 2020-05-21 ENCOUNTER — Inpatient Hospital Stay: Payer: 59 | Attending: Family

## 2020-05-21 ENCOUNTER — Inpatient Hospital Stay (HOSPITAL_BASED_OUTPATIENT_CLINIC_OR_DEPARTMENT_OTHER): Payer: 59 | Admitting: Family

## 2020-05-21 VITALS — BP 135/76 | HR 77 | Temp 98.5°F | Resp 19 | Wt 302.1 lb

## 2020-05-21 DIAGNOSIS — M4800 Spinal stenosis, site unspecified: Secondary | ICD-10-CM | POA: Diagnosis not present

## 2020-05-21 DIAGNOSIS — Z803 Family history of malignant neoplasm of breast: Secondary | ICD-10-CM

## 2020-05-21 DIAGNOSIS — D573 Sickle-cell trait: Secondary | ICD-10-CM | POA: Diagnosis not present

## 2020-05-21 DIAGNOSIS — R590 Localized enlarged lymph nodes: Secondary | ICD-10-CM

## 2020-05-21 DIAGNOSIS — G588 Other specified mononeuropathies: Secondary | ICD-10-CM | POA: Diagnosis not present

## 2020-05-21 DIAGNOSIS — M5126 Other intervertebral disc displacement, lumbar region: Secondary | ICD-10-CM

## 2020-05-21 DIAGNOSIS — M069 Rheumatoid arthritis, unspecified: Secondary | ICD-10-CM | POA: Diagnosis not present

## 2020-05-21 DIAGNOSIS — R591 Generalized enlarged lymph nodes: Secondary | ICD-10-CM | POA: Diagnosis not present

## 2020-05-21 DIAGNOSIS — D649 Anemia, unspecified: Secondary | ICD-10-CM

## 2020-05-21 DIAGNOSIS — R599 Enlarged lymph nodes, unspecified: Secondary | ICD-10-CM

## 2020-05-21 DIAGNOSIS — D509 Iron deficiency anemia, unspecified: Secondary | ICD-10-CM | POA: Diagnosis not present

## 2020-05-21 DIAGNOSIS — R0602 Shortness of breath: Secondary | ICD-10-CM

## 2020-05-21 LAB — CMP (CANCER CENTER ONLY)
ALT: 16 U/L (ref 0–44)
AST: 14 U/L — ABNORMAL LOW (ref 15–41)
Albumin: 3.7 g/dL (ref 3.5–5.0)
Alkaline Phosphatase: 54 U/L (ref 38–126)
Anion gap: 6 (ref 5–15)
BUN: 15 mg/dL (ref 6–20)
CO2: 27 mmol/L (ref 22–32)
Calcium: 9.3 mg/dL (ref 8.9–10.3)
Chloride: 105 mmol/L (ref 98–111)
Creatinine: 0.71 mg/dL (ref 0.44–1.00)
GFR, Estimated: >60 mL/min (ref >60)
Glucose, Bld: 114 mg/dL — ABNORMAL HIGH (ref 70–99)
Potassium: 3.8 mmol/L (ref 3.5–5.1)
Sodium: 138 mmol/L (ref 135–145)
Total Bilirubin: 0.7 mg/dL (ref 0.3–1.2)
Total Protein: 7.6 g/dL (ref 6.5–8.1)

## 2020-05-21 LAB — CBC WITH DIFFERENTIAL (CANCER CENTER ONLY)
Abs Immature Granulocytes: 0.04 10*3/uL (ref 0.00–0.07)
Basophils Absolute: 0 10*3/uL (ref 0.0–0.1)
Basophils Relative: 1 %
Eosinophils Absolute: 0.2 10*3/uL (ref 0.0–0.5)
Eosinophils Relative: 4 %
HCT: 35.4 % — ABNORMAL LOW (ref 36.0–46.0)
Hemoglobin: 11.1 g/dL — ABNORMAL LOW (ref 12.0–15.0)
Immature Granulocytes: 1 %
Lymphocytes Relative: 26 %
Lymphs Abs: 1.2 10*3/uL (ref 0.7–4.0)
MCH: 24.8 pg — ABNORMAL LOW (ref 26.0–34.0)
MCHC: 31.4 g/dL (ref 30.0–36.0)
MCV: 79.2 fL — ABNORMAL LOW (ref 80.0–100.0)
Monocytes Absolute: 0.4 10*3/uL (ref 0.1–1.0)
Monocytes Relative: 7 %
Neutro Abs: 2.9 10*3/uL (ref 1.7–7.7)
Neutrophils Relative %: 61 %
Platelet Count: 356 10*3/uL (ref 150–400)
RBC: 4.47 MIL/uL (ref 3.87–5.11)
RDW: 15.2 % (ref 11.5–15.5)
WBC Count: 4.8 10*3/uL (ref 4.0–10.5)
nRBC: 0 % (ref 0.0–0.2)

## 2020-05-21 LAB — RETICULOCYTES
Immature Retic Fract: 15 % (ref 2.3–15.9)
RBC.: 4.54 MIL/uL (ref 3.87–5.11)
Retic Count, Absolute: 74.9 10*3/uL (ref 19.0–186.0)
Retic Ct Pct: 1.7 % (ref 0.4–3.1)

## 2020-05-21 LAB — IRON AND TIBC
Iron: 48 ug/dL (ref 41–142)
Saturation Ratios: 15 % — ABNORMAL LOW (ref 21–57)
TIBC: 325 ug/dL (ref 236–444)
UIBC: 277 ug/dL (ref 120–384)

## 2020-05-21 LAB — FERRITIN: Ferritin: 217 ng/mL (ref 11–307)

## 2020-05-21 LAB — SAVE SMEAR(SSMR), FOR PROVIDER SLIDE REVIEW

## 2020-05-21 LAB — LACTATE DEHYDROGENASE: LDH: 187 U/L (ref 98–192)

## 2020-05-21 NOTE — Progress Notes (Signed)
Hematology/Oncology Consultation   Name: Miranda Padilla      MRN: [id number]    Location: Room/bed info not found  Date: 05/21/2020 Time:10:18 AM   REFERRING PHYSICIAN: Bayley McMichael, PA-C  REASON FOR CONSULT: Anemia    DIAGNOSIS: Iron deficiency anemia and enlarged distal retroperitoneal and pelvic lymph nodes  HISTORY OF PRESENT ILLNESS: [name] is a very pleasant 55 yo African American female with long history of anemia. She states that both she and her mother carry the sickle cell trait.  She is currently on folic acid daily.  She has not noted any blood loss. No bruising or petechiae.  She went through menopause naturally. No hysterectomy.  She has history of 1 miscarriage she states early during the first few weeks of pregnancy.  She had an MRI in February to assess for cause of lower back pain and numbness and tingling in her feet. This showed lower periaortic and iliac chain adenopathy concerning for malignancy. Follow-up CT recommended which showed borderline enlarged distal retroperitoneal and pelvic lymph nodes.  She has chronic back issues including cervical spine stenosis, herniated disc in the lower back and 2 pinched nerves at S1 and L4. She states that she needs a steroid injection but this is on hold due to the adenopathy noted in the pelvis.  She also has rheumatoid arthritis and was previously on Orincia and Methotrexate but these are also on hold due to the adenopathy. She is currently on Prednisone 3 mg PO daily.  She still has numbness and tingling in her feet and ankles. She states that she had a nerve conduction test and that results were negative.  She notes swelling in her hands and feet with the arthritis.   No falls or syncope to report.  Both of her parents have history of thrombocytopenia.  No personal history of cancer. Father has history of prostate cancer.  No fever, chills, n/v, cough, rash, dizziness, chest pain, palpitations, abdominal  pain or changes in bowel or bladder habits.  She has some SOB noted with exertion. She takes a break to rest when needed.  She states that she is up to date on her mammogram and most recent was negative.  She had her colonoscopy in 05/2016 and had 3 benign polyps removed.  No smoking, ETOH or recreational drug use.  She has maintained a good appetite and is staying well hydrated. Her weight is stable at 302 lbs.   ROS: All other 10 point review of systems is negative.   PAST MEDICAL HISTORY:   Past Medical History:  Diagnosis Date  . Abdominal distension   . Abdominal pain   . Anemia   . Anxiety   . Arthritis   . Asthma   . Bronchitis   . Constipation   . Cough   . Depression   . Family history of adverse reaction to anesthesia    Pt mother would wake up during procedures  . GERD (gastroesophageal reflux disease)   . Headache(784.0)   . Nausea & vomiting   . Obsessive compulsive disorder    not on any medication at this time    ALLERGIES: Allergies  Allergen Reactions  . Sulfonamide Derivatives Itching and Rash    All over the body.      MEDICATIONS:  Current Outpatient Medications on File Prior to Visit  Medication Sig Dispense Refill  . abatacept (ORENCIA) 250 MG injection See admin instructions.    . ergocalciferol (VITAMIN D2) 1.25 MG (50000 UT) capsule 1  capsule    . folic acid (FOLVITE) 1 MG tablet Take 1 tablet by mouth daily.    . furosemide (LASIX) 20 MG tablet Take 1 tablet (20 mg total) by mouth daily as needed. Daily as needed for swelling of the lower legs 30 tablet 0  . oxyCODONE-acetaminophen (PERCOCET) 5-325 MG tablet Take 1-2 tablets by mouth every 4 (four) hours as needed. 20 tablet 0  . predniSONE (DELTASONE) 5 MG tablet 1tablets    . zonisamide (ZONEGRAN) 100 MG capsule 100 mg 2 (two) times daily.     No current facility-administered medications on file prior to visit.     PAST SURGICAL HISTORY Past Surgical History:  Procedure Laterality Date   . CHOLECYSTECTOMY  02/03/2011   Procedure: LAPAROSCOPIC CHOLECYSTECTOMY;  Surgeon: Harl Bowie, MD;  Location: [facility];  Service: General;  Laterality: N/A;  Laparoscopic Choleystectomy  . COLONOSCOPY WITH PROPOFOL N/A 06/01/2016   Procedure: COLONOSCOPY WITH PROPOFOL;  Surgeon: Ronnette Juniper, MD;  Location: [facility];  Service: Gastroenterology;  Laterality: N/A;  . MOUTH SURGERY  2000    FAMILY HISTORY: Family History  Problem Relation Age of Onset  . Thrombocytopenia Mother   . Anesthesia problems Mother   . Chronic Renal Failure Mother   . Cancer Maternal Grandmother        breast    SOCIAL HISTORY:  reports that she has never smoked. She has never used smokeless tobacco. She reports that she does not drink alcohol and does not use drugs.  PERFORMANCE STATUS: The patient's performance status is 1 - Symptomatic but completely ambulatory  PHYSICAL EXAM: Most Recent Vital Signs: Last menstrual period 03/24/2016. BP 135/76 (BP Location: Left Arm, Patient Position: Sitting)   Pulse 77   Temp 98.5 F (36.9 C) (Oral)   Resp 19   Wt (!) 302 lb 1.3 oz (137 kg)   LMP 03/24/2016 (Exact Date)   SpO2 100%   BMI 50.27 kg/m   General Appearance:    Alert, cooperative, no distress, appears stated age  Head:    Normocephalic, without obvious abnormality, atraumatic  Eyes:    PERRL, conjunctiva/corneas clear, EOM's intact, fundi    benign, both eyes        Throat:   Lips, mucosa, and tongue normal; teeth and gums normal  Neck:   Supple, symmetrical, trachea midline, no adenopathy;    thyroid:  no enlargement/tenderness/nodules; no carotid   bruit or JVD  Back:     Symmetric, no curvature, ROM normal, no CVA tenderness  Lungs:     Clear to auscultation bilaterally, respirations unlabored  Chest Wall:    No tenderness or deformity   Heart:    Regular rate and rhythm, S1 and S2 normal, no murmur, rub   or gallop     Abdomen:     Soft, non-tender, bowel sounds active all four  quadrants,    no masses, no organomegaly        Extremities:   Extremities normal, atraumatic, no cyanosis or edema  Pulses:   2+ and symmetric all extremities  Skin:   Skin color, texture, turgor normal, no rashes or lesions  Lymph nodes:   Cervical, supraclavicular, and axillary nodes normal  Neurologic:   CNII-XII intact, normal strength, sensation and reflexes    throughout    LABORATORY DATA:  Results for orders placed or performed in visit on 05/21/20 (from the past 48 hour(s))  Save Smear (SSMR)     Status: None   Collection Time:  05/21/20  9:54 AM  Result Value Ref Range   Smear Review SMEAR STAINED AND AVAILABLE FOR REVIEW     Comment: Performed at [facility] at [facility], [address] [place], [place] [address]  CBC with Differential ([place] Only)     Status: Abnormal   Collection Time: 05/21/20  9:54 AM  Result Value Ref Range   WBC Count 4.8 4.0 - 10.5 K/uL   RBC 4.47 3.87 - 5.11 MIL/uL   Hemoglobin 11.1 (L) 12.0 - 15.0 g/dL   HCT 35.4 (L) 36.0 - 46.0 %   MCV 79.2 (L) 80.0 - 100.0 fL   MCH 24.8 (L) 26.0 - 34.0 pg   MCHC 31.4 30.0 - 36.0 g/dL   RDW 15.2 11.5 - 15.5 %   Platelet Count 356 150 - 400 K/uL   nRBC 0.0 0.0 - 0.2 %   Neutrophils Relative % 61 %   Neutro Abs 2.9 1.7 - 7.7 K/uL   Lymphocytes Relative 26 %   Lymphs Abs 1.2 0.7 - 4.0 K/uL   Monocytes Relative 7 %   Monocytes Absolute 0.4 0.1 - 1.0 K/uL   Eosinophils Relative 4 %   Eosinophils Absolute 0.2 0.0 - 0.5 K/uL   Basophils Relative 1 %   Basophils Absolute 0.0 0.0 - 0.1 K/uL   Immature Granulocytes 1 %   Abs Immature Granulocytes 0.04 0.00 - 0.07 K/uL    Comment: Performed at [facility] at [facility], [address], [place], [place] [address]  Reticulocytes     Status: None   Collection Time: 05/21/20  9:55 AM  Result Value Ref Range   Retic Ct Pct 1.7 0.4 - 3.1 %   RBC. 4.54 3.87 - 5.11 MIL/uL   Retic Count, Absolute  74.9 19.0 - 186.0 K/uL   Immature Retic Fract 15.0 2.3 - 15.9 %    Comment: Performed at [facility] at [facility], [address] [place], [place] [address]      RADIOGRAPHY: No results found.     PATHOLOGY: None  ASSESSMENT/PLAN: Ms. Piscopo is a very pleasant 55 yo African American female with iron deficiency anemia and sickle cell trait.  She also has noted enlarged distal retroperitoneal and pelvic lymph nodes. We will get another CT of the abdomen and pelvis to reassess. She notes SOB with any exertion so we will also get a CT of the chest to assess for further adenopathy. We will set her up for IV iron replacement.  Follow-up in 8 weeks.   All questions were answered and she is in agreement with the plan. The patient knows to call the clinic with any problems, questions or concerns. We can certainly see the patient much sooner if necessary.  The patient was discussed with Dr. Marin Olp and he is in agreement with the aforementioned.   [facility]

## 2020-05-22 LAB — ERYTHROPOIETIN: Erythropoietin: 18.1 m[IU]/mL (ref 2.6–18.5)

## 2020-05-24 ENCOUNTER — Telehealth: Payer: Self-pay

## 2020-05-24 ENCOUNTER — Other Ambulatory Visit: Payer: Self-pay | Admitting: Family

## 2020-05-24 DIAGNOSIS — D509 Iron deficiency anemia, unspecified: Secondary | ICD-10-CM | POA: Insufficient documentation

## 2020-05-24 LAB — HGB FRACTIONATION CASCADE
Hgb A2: 3 % (ref 1.8–3.2)
Hgb A: 62.1 % — ABNORMAL LOW (ref 96.4–98.8)
Hgb F: 0 % (ref 0.0–2.0)
Hgb S: 34.9 % — ABNORMAL HIGH

## 2020-05-24 LAB — HGB SOLUBILITY: Hgb Solubility: POSITIVE — AB

## 2020-05-24 NOTE — Telephone Encounter (Signed)
Called pt and she is awrae of her appts per sch message for iv iron  Miranda Padilla

## 2020-05-25 ENCOUNTER — Telehealth: Payer: Self-pay | Admitting: *Deleted

## 2020-05-25 NOTE — Telephone Encounter (Signed)
Per 05/21/20 los  - called and gave patient upcoming appointments

## 2020-05-28 ENCOUNTER — Inpatient Hospital Stay: Payer: 59 | Attending: Family

## 2020-05-28 ENCOUNTER — Other Ambulatory Visit: Payer: Self-pay

## 2020-05-28 VITALS — BP 118/72 | HR 79 | Temp 99.2°F | Resp 18

## 2020-05-28 DIAGNOSIS — D509 Iron deficiency anemia, unspecified: Secondary | ICD-10-CM | POA: Insufficient documentation

## 2020-05-28 MED ORDER — SODIUM CHLORIDE 0.9 % IV SOLN
125.0000 mg | Freq: Once | INTRAVENOUS | Status: AC
  Administered 2020-05-28: 125 mg via INTRAVENOUS
  Filled 2020-05-28: qty 10

## 2020-05-28 MED ORDER — SODIUM CHLORIDE 0.9 % IV SOLN
Freq: Once | INTRAVENOUS | Status: AC
Start: 2020-05-28 — End: 2020-05-28
  Filled 2020-05-28: qty 250

## 2020-05-28 NOTE — Patient Instructions (Signed)

## 2020-05-29 ENCOUNTER — Ambulatory Visit (HOSPITAL_BASED_OUTPATIENT_CLINIC_OR_DEPARTMENT_OTHER)
Admission: RE | Admit: 2020-05-29 | Discharge: 2020-05-29 | Disposition: A | Discharge status: Home / Self Care | Payer: 59 | Source: Ambulatory Visit | Attending: Family | Admitting: Family

## 2020-05-29 ENCOUNTER — Other Ambulatory Visit: Payer: Self-pay

## 2020-05-29 DIAGNOSIS — R599 Enlarged lymph nodes, unspecified: Secondary | ICD-10-CM | POA: Diagnosis present

## 2020-05-29 DIAGNOSIS — R590 Localized enlarged lymph nodes: Secondary | ICD-10-CM | POA: Diagnosis present

## 2020-05-29 IMAGING — CT CT CHEST-ABD-PELV W/ CM
3 of 5 series · 15 of 36 positions shown, 17 images · IV contrast (Omnipaque)
Comparison: [DATE]

CLINICAL DATA: Follow-up retroperitoneal and pelvic lymphadenopathy
identified on prior CT of the abdomen and pelvis

EXAM:
CT CHEST, ABDOMEN, AND PELVIS WITH CONTRAST
TECHNIQUE: Multidetector CT imaging of the chest, abdomen and pelvis was
performed following the standard protocol during bolus
administration of intravenous contrast.
CONTRAST:  100mL OMNIPAQUE IOHEXOL 300 MG/ML SOLN, additional oral
enteric contrast

[Series 2: cap with 2 · axial · 0.91mm/px · z∈[+804,+1334]mm · 10 of 130 slices shown, 12 images]
[im 12/130  mediastinal]
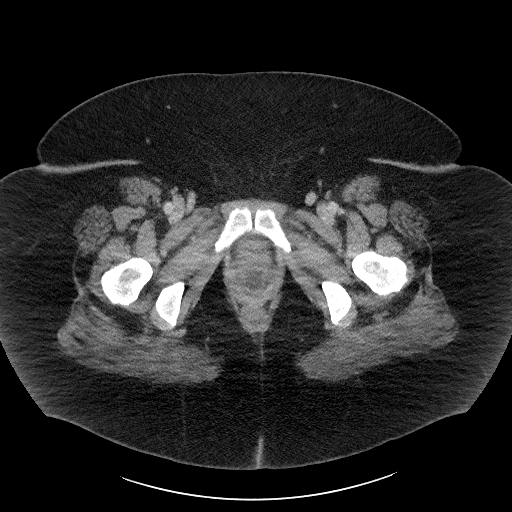
[im 12/130  bone]
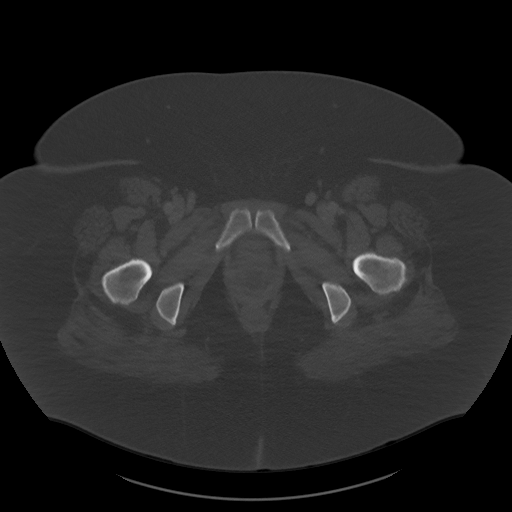
[im 24/130  mediastinal]
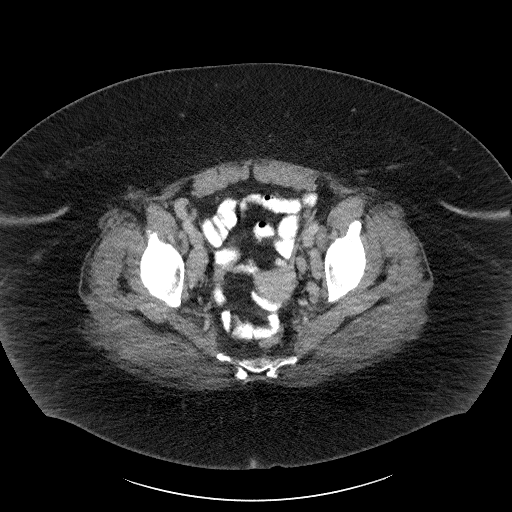
[im 36/130  mediastinal]
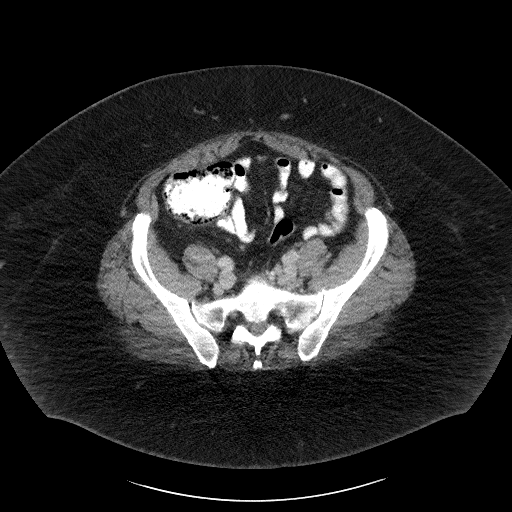
[im 47/130  mediastinal]
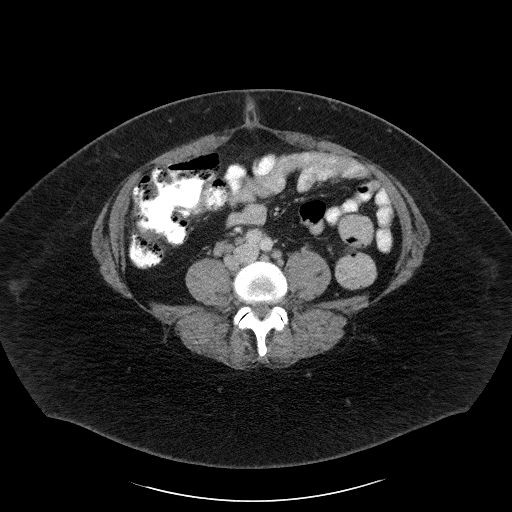
[im 59/130  mediastinal]
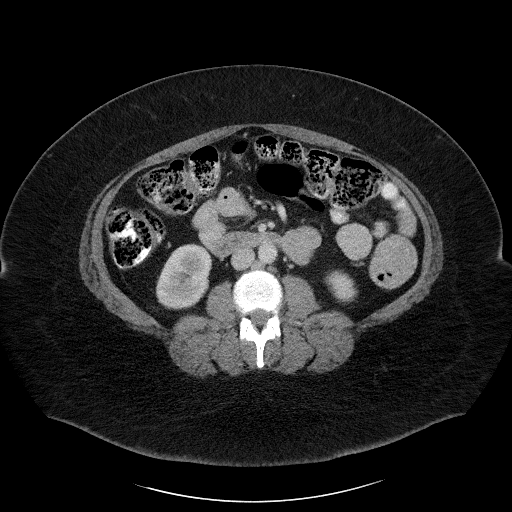
[im 71/130  mediastinal]
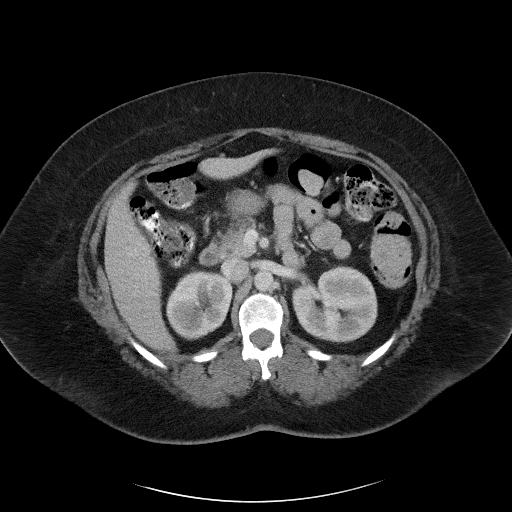
[im 83/130  mediastinal]
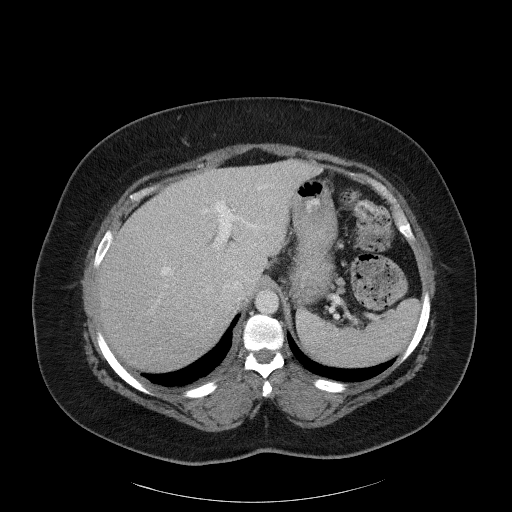
[im 94/130  mediastinal]
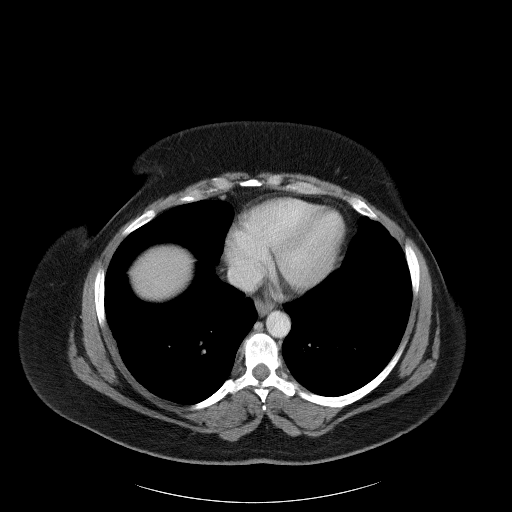
[im 106/130  mediastinal]
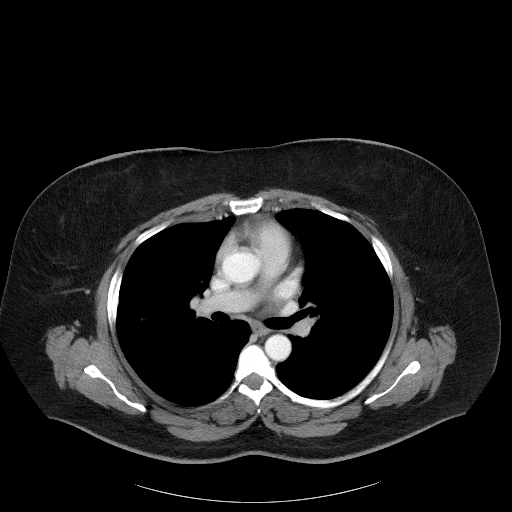
[im 106/130  bone]
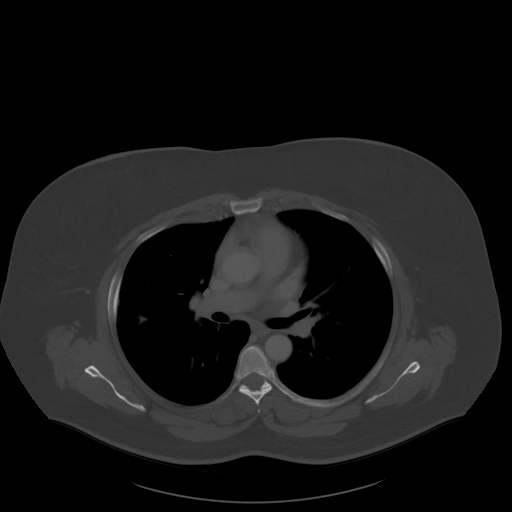
[im 118/130  mediastinal]
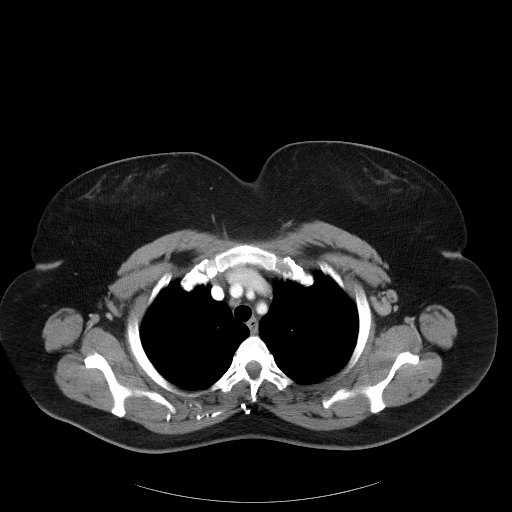

[Series 4: lung · axial · 0.91mm/px · z∈[+1136,+1184]mm · 2 of 141 slices shown]
[im 12/141  bone]
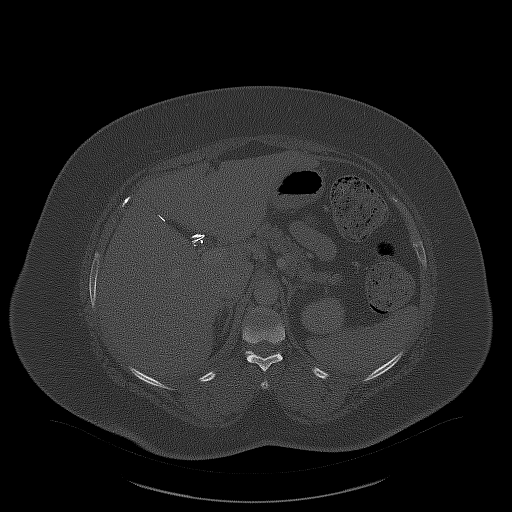
[im 36/141  bone]
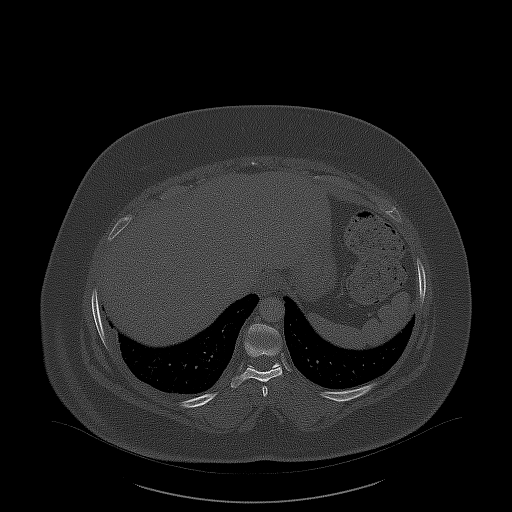

[Series 5: coronals · coronal · 1.06mm/px · 3 of 195 slices shown]
[im 39/195  mediastinal]
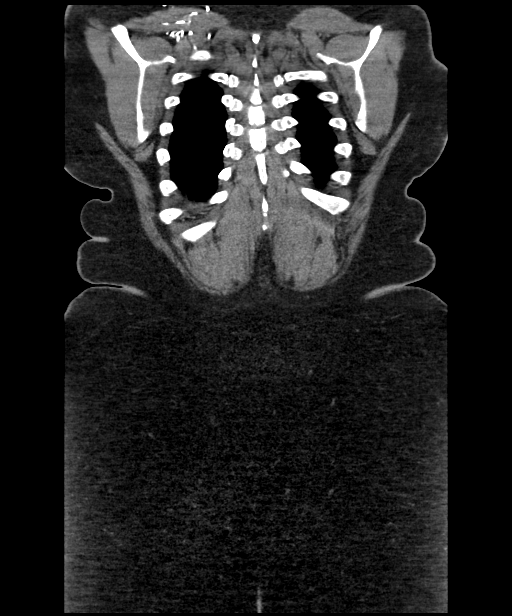
[im 78/195  mediastinal]
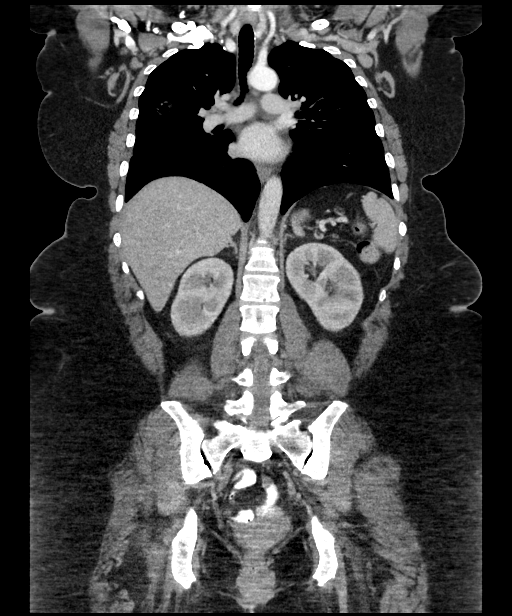
[im 117/195  mediastinal]
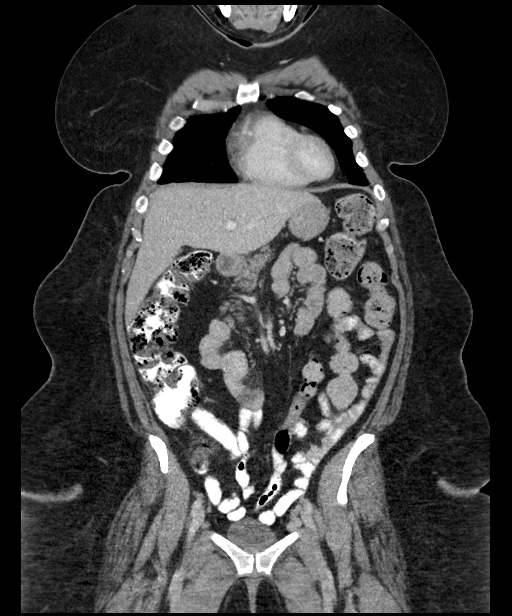

[15 of 36 positions shown; findings below may reference images not displayed]

FINDINGS: CT CHEST FINDINGS

Cardiovascular: No significant vascular findings. Normal heart size.
No pericardial effusion.

Mediastinum/Nodes: Numerous prominent bilateral axillary and
subpectoral lymph nodes, largest left axillary node measuring 2.1 x
1.4 cm (series 2, image 13). No abnormally enlarged mediastinal
hilar lymph nodes. Thyroid gland, trachea, and esophagus demonstrate
no significant findings.

Lungs/Pleura: Irregular subsolid lesion of the inferior right upper
lobe measuring 3.4 x 2.1 cm (series 4, image 57). Additional more
spiculated, irregular appearing nodular opacities of the right
middle lobe, dominant component of the lateral segment measuring
x 1.7 cm (series 4, image 69). No pleural effusion or pneumothorax.

Musculoskeletal: No chest wall mass or suspicious bone lesions
identified.

CT ABDOMEN PELVIS FINDINGS

Hepatobiliary: No focal liver abnormality is seen. Status post
cholecystectomy. No biliary dilatation.

Pancreas: Unremarkable. No pancreatic ductal dilatation or
surrounding inflammatory changes.

Spleen: Normal in size without significant abnormality.

Adrenals/Urinary Tract: Adrenal glands are unremarkable. Kidneys are
normal, without renal calculi, solid lesion, or hydronephrosis.
Bladder is unremarkable.

Stomach/Bowel: Stomach is within normal limits. Appendix appears
normal. No evidence of bowel wall thickening, distention, or
inflammatory changes.

Vascular/Lymphatic: No significant vascular findings are present.
Numerous enlarged inferior retroperitoneal, bilateral iliac, and
pelvic sidewall lymph nodes are again noted, largest right pelvic
sidewall node measuring 4.0 x 1.8 cm, perhaps slightly enlarged
compared to prior examination at which time this node measured 3.6 x
1.6 cm when measured similarly (series 2, image 109). There has
likewise been slight interval enlargement of a right inferior
retroperitoneal lymph node, measuring 2.0 x 1.5 cm, previously 2.0 x
1.0 cm when measured similarly (series 2, image 85).

Reproductive: No mass or other abnormality.

Other: No abdominal wall hernia or abnormality. No abdominopelvic
ascites.

Musculoskeletal: No acute or significant osseous findings.
IMPRESSION: 1. Numerous enlarged inferior retroperitoneal, bilateral iliac, and
pelvic sidewall lymph nodes are again noted, slightly enlarged
compared to prior examination. Findings are concerning for lymphoma
or other lymphoproliferative disorder.
2. Numerous prominent bilateral axillary and subpectoral lymph
nodes, although not previously imaged, worrisome for malignancy.
3. Irregular subsolid lesion of the inferior right upper lobe
measuring 3.4 x 2.1 cm. Additional more spiculated, irregular
appearing nodular opacities of the right middle lobe, dominant
component of the lateral segment measuring 2.6 x 1.7 cm. These are
of uncertain nature, but most likely incidental to lymphadenopathy
(particularly given absence of hilar or mediastinal lymphadenopathy)
and given distribution, most likely infectious or inflammatory,
however adenocarcinoma is difficult to exclude.
4. Consider multidisciplinary referral for consideration of PET-CT
and or tissue sampling.

## 2020-05-29 MED ORDER — IOHEXOL 300 MG/ML  SOLN
100.0000 mL | Freq: Once | INTRAMUSCULAR | Status: AC
  Administered 2020-05-29: 100 mL via INTRAVENOUS

## 2020-05-31 ENCOUNTER — Telehealth: Payer: Self-pay | Admitting: Family

## 2020-05-31 ENCOUNTER — Other Ambulatory Visit: Payer: Self-pay | Admitting: Family

## 2020-05-31 ENCOUNTER — Telehealth: Payer: Self-pay | Admitting: *Deleted

## 2020-05-31 DIAGNOSIS — R599 Enlarged lymph nodes, unspecified: Secondary | ICD-10-CM

## 2020-05-31 LAB — ALPHA-THALASSEMIA GENOTYPR

## 2020-05-31 NOTE — Telephone Encounter (Signed)
I was able to speak with Ms. Koehl and go over CT scan results which showed diffuse adenopathy throughout the chest, abdomen and pelvis.  I placed an urgent referral with Dr. Ninfa Linden for excision and biopsy of axillary lymph node to work up for possible lymphoma. She will continue to [employer] and Methotrexate for now. We will contact with follow-up once results are available. Patient in agreement with the plan no other questions at this time. She was appreciative of call.

## 2020-05-31 NOTE — Telephone Encounter (Signed)
Per Sarah's scheduling message 05/31/20 faxed referral to [facility]

## 2020-06-01 ENCOUNTER — Telehealth: Payer: Self-pay | Admitting: *Deleted

## 2020-06-01 NOTE — Telephone Encounter (Signed)
-----   Message from Eliezer Bottom, NP sent at 06/01/2020  8:33 AM EDT ----- She is positive for a minor type of alpha thalassemia. This is common in the African American population and just mean she has a tendency to make smaller and less red blood cells. Treatment for this is folic acid 1 mg PO daily. I see this on her med list. Is she taking? Thank you!  Sarah    ----- Message ----- From: Buel Ream, Lab [facility] [place] Sent: 05/21/2020  10:11 AM EDT To: Eliezer Bottom, NP

## 2020-06-01 NOTE — Telephone Encounter (Signed)
Notified pt of lab results and instructions.Pt verbalized understanding and confirmed she will continue to take Folic Acid daily. No further concerns.

## 2020-06-11 ENCOUNTER — Other Ambulatory Visit: Payer: Self-pay | Admitting: Surgery

## 2020-06-23 ENCOUNTER — Other Ambulatory Visit: Payer: Self-pay

## 2020-06-23 ENCOUNTER — Encounter (HOSPITAL_BASED_OUTPATIENT_CLINIC_OR_DEPARTMENT_OTHER): Payer: Self-pay | Admitting: Surgery

## 2020-06-23 NOTE — Progress Notes (Addendum)
Chart reviewed by Dr. Smith Robert, anesthesiologist. States that surgery does not need to be moved to the main at this time for BMI of 50.75. Patient to come in for consult with anesthesiologist and then proceed based on anesthesia assessment/airway condition.

## 2020-06-25 MED ORDER — ENSURE PRE-SURGERY PO LIQD: 296.0000 mL | Freq: Once | ORAL | Status: DC

## 2020-06-25 NOTE — Progress Notes (Signed)

## 2020-06-25 NOTE — Progress Notes (Signed)
Anesthesia consult per Dr. Smith Robert, will proceed with surgery as scheduled.

## 2020-06-30 NOTE — H&P (Signed)
Linton Rump Appointment: 06/11/2020 9:50 AM Location: [facility] Patient #: [id number] DOB: 03/29/1965 Married / Language: English / Race: Black or African American Female   History of Present Illness (Bobette Leyh A. Ninfa Linden MD; 06/11/2020 10:15 AM) The patient is a 55 year old female who presents with lymphadenopathy.  Chief complaint: Lymphadenopathy  This is a 55 year old female with chronic anemia and sickle cell trait who is referred by hematology/oncology for increasing lymphadenopathy. She has undergone a CT scan of her abdomen and pelvis in February which showed enlarging retroperitoneal and pelvic lymph nodes. A follow-up CT scan on May 8 of this year showed further enlargement of retroperitoneal and iliac lymph nodes. There were also bilateral axillary and subpectoral lymph nodes enlarged and an irregular lesion in the right upper lobe of her lung. Hematology oncology has recommended a lymph node excisional biopsy. She is currently otherwise without complaints. She has no fevers or chills.   Past Surgical History Janeann Forehand, CNA; 06/11/2020 9:46 AM) Gallbladder Surgery - Laparoscopic  Oral Surgery   Diagnostic Studies History Janeann Forehand, CNA; 06/11/2020 9:46 AM) Mammogram  within last year Pap Smear  1-5 years ago  Allergies Janeann Forehand, CNA; 06/11/2020 9:47 AM) Sulfa 10 *OPHTHALMIC AGENTS*  Allergies Reconciled   Medication History Janeann Forehand, CNA; 10/07/7827 5:62 AM) Folic Acid (1MG  Tablet, Oral) Active. Furosemide (20MG  Tablet, Oral) Active. Gabapentin (300MG  Capsule, Oral) Active. Zonisamide (100MG  Capsule, Oral) Active. predniSONE (5MG  Tablet, Oral) Active. Medications Reconciled  Social History Janeann Forehand, CNA; 06/11/2020 9:46 AM) Alcohol use  Remotely quit alcohol use. Caffeine use  Coffee. No drug use  Tobacco use  Never smoker.  Family History Janeann Forehand, CNA; 06/11/2020 9:46  AM) Alcohol Abuse  Brother, Family Members In General, Father. Anesthetic complications  Mother. Arthritis  Mother. Bleeding disorder  Father, Mother. Depression  Brother. Diabetes Mellitus  Brother, Family Members In [place], Mother. Heart Disease  Father. Heart disease in female family member before age 28  Hypertension  Mother. Kidney Disease  Mother. Prostate Cancer  Father. Respiratory Condition  Father, Mother.  Pregnancy / Birth History Janeann Forehand, CNA; 06/11/2020 9:46 AM) Age at menarche  11 years. Age of menopause  14-55 Gravida  1 Irregular periods  Maternal age  28-40 Para  0  Other Problems Janeann Forehand, CNA; 06/11/2020 9:46 AM) Anxiety Disorder  Arthritis  Asthma  Back Pain  Depression  Migraine Headache     Review of Systems Janeann Forehand CNA; 06/11/2020 9:46 AM) General Present- Fatigue and Night Sweats. Not Present- Appetite Loss, Chills, Fever, Weight Gain and Weight Loss. Skin Not Present- Change in Wart/Mole, Dryness, Hives, Jaundice, New Lesions, Non-Healing Wounds, Rash and Ulcer. HEENT Present- Wears glasses/contact lenses. Not Present- Earache, Hearing Loss, Hoarseness, Nose Bleed, Oral Ulcers, Ringing in the Ears, Seasonal Allergies, Sinus Pain, Sore Throat, Visual Disturbances and Yellow Eyes. Respiratory Not Present- Bloody sputum, Chronic Cough, Difficulty Breathing, Snoring and Wheezing. Breast Not Present- Breast Mass, Breast Pain, Nipple Discharge and Skin Changes. Cardiovascular Present- Swelling of Extremities. Not Present- Chest Pain, Difficulty Breathing Lying Down, Leg Cramps, Palpitations, Rapid Heart Rate and Shortness of Breath. Gastrointestinal Not Present- Abdominal Pain, Bloating, Bloody Stool, Change in Bowel Habits, Chronic diarrhea, Constipation, Difficulty Swallowing, Excessive gas, Gets full quickly at meals, Hemorrhoids, Indigestion, Nausea, Rectal Pain and Vomiting. Female Genitourinary Not  Present- Frequency, Nocturia, Painful Urination, Pelvic Pain and Urgency. Musculoskeletal Present- Back Pain, Joint Stiffness and Swelling of Extremities. Not Present- Joint Pain, Muscle Pain and Muscle  Weakness. Neurological Present- Headaches, Numbness, Tingling and Trouble walking. Not Present- Decreased Memory, Fainting, Seizures, Tremor and Weakness. Psychiatric Present- Anxiety. Not Present- Bipolar, Change in Sleep Pattern, Depression, Fearful and Frequent crying. Endocrine Not Present- Cold Intolerance, Excessive Hunger, Hair Changes, Heat Intolerance, Hot flashes and New Diabetes. Hematology Not Present- Blood Thinners, Easy Bruising, Excessive bleeding, Gland problems, HIV and Persistent Infections.  Vitals Adriana Reams Alston CNA; 06/11/2020 9:48 AM) 06/11/2020 9:48 AM Weight: 302.38 lb Height: 65in Body Surface Area: 2.36 m Body Mass Index: 50.32 kg/m  Temp.: 97.45F  Pulse: 101 (Regular)  P.OX: 98% (Room air) BP: 140/96(Sitting, Left Arm, Standard)       Physical Exam (Aza Dantes A. Ninfa Linden MD; 06/11/2020 10:15 AM) The physical exam findings are as follows: Note: She appears well exam.  There is a large 2-3 cm lymph node in her right axilla which is easily palpable. I can easily feel the lymph nodes in her left axilla or her inguinal areas.  Lungs clear  CV RRR  Abdomen soft, NT  Neuro Grossly intact     Assessment & Plan  LYMPHADENOPATHY, AXILLARY (R59.0)  Impression: I reviewed her notes in the electronic medical records and I reviewed her CT scans. I've also reviewed her notes from oncology. A right axillary excisional biopsy of lymph nodes is recommended for complete histologic evaluation to rule malignancy or lymphoma. She fully understands the reasoning for this and is eager to proceed with surgery soon as possible. We discussed the risk which includes not limited to bleeding, infection, injury to surrounding structures, chronic seroma formation, the  chance this may be nondiagnostic and further procedures are needed, cardiopulmonary issues, postoperative recovery, etc. She understands and surgery will be scheduled as soon as possible

## 2020-07-01 ENCOUNTER — Other Ambulatory Visit: Payer: Self-pay

## 2020-07-01 ENCOUNTER — Encounter (HOSPITAL_BASED_OUTPATIENT_CLINIC_OR_DEPARTMENT_OTHER)
Admission: RE | Disposition: A | Discharge status: Home / Self Care | Payer: Self-pay | Source: Home / Self Care | Attending: Surgery

## 2020-07-01 ENCOUNTER — Ambulatory Visit (HOSPITAL_BASED_OUTPATIENT_CLINIC_OR_DEPARTMENT_OTHER)
Admission: RE | Admit: 2020-07-01 | Discharge: 2020-07-01 | Disposition: A | Discharge status: Home / Self Care | Payer: 59 | Attending: Surgery | Admitting: Surgery

## 2020-07-01 ENCOUNTER — Ambulatory Visit (HOSPITAL_BASED_OUTPATIENT_CLINIC_OR_DEPARTMENT_OTHER): Payer: 59 | Admitting: Anesthesiology

## 2020-07-01 DIAGNOSIS — Z882 Allergy status to sulfonamides status: Secondary | ICD-10-CM | POA: Insufficient documentation

## 2020-07-01 DIAGNOSIS — R590 Localized enlarged lymph nodes: Secondary | ICD-10-CM | POA: Diagnosis not present

## 2020-07-01 DIAGNOSIS — Z7952 Long term (current) use of systemic steroids: Secondary | ICD-10-CM | POA: Diagnosis not present

## 2020-07-01 DIAGNOSIS — Z79899 Other long term (current) drug therapy: Secondary | ICD-10-CM | POA: Insufficient documentation

## 2020-07-01 HISTORY — DX: Sickle-cell trait: D57.3

## 2020-07-01 HISTORY — PX: AXILLARY LYMPH NODE BIOPSY: SHX5737

## 2020-07-01 HISTORY — DX: Thalassemia, unspecified: D56.9

## 2020-07-01 SURGERY — AXILLARY LYMPH NODE BIOPSY
Anesthesia: General | Site: Axilla | Laterality: Right

## 2020-07-01 MED ORDER — OXYCODONE HCL 5 MG PO TABS: 5.0000 mg | ORAL_TABLET | Freq: Four times a day (QID) | ORAL | 0 refills | Status: DC | PRN

## 2020-07-01 MED ORDER — PROPOFOL 10 MG/ML IV BOLUS
INTRAVENOUS | Status: DC | PRN
  Administered 2020-07-01: 200 mg via INTRAVENOUS
  Administered 2020-07-01: 100 mg via INTRAVENOUS

## 2020-07-01 MED ORDER — CEFAZOLIN SODIUM-DEXTROSE 2-4 GM/100ML-% IV SOLN
2.0000 g | INTRAVENOUS | Status: AC
  Administered 2020-07-01: 3 g via INTRAVENOUS

## 2020-07-01 MED ORDER — PROPOFOL 10 MG/ML IV BOLUS
INTRAVENOUS | Status: AC
  Filled 2020-07-01: qty 20

## 2020-07-01 MED ORDER — SUCCINYLCHOLINE CHLORIDE 200 MG/10ML IV SOSY
PREFILLED_SYRINGE | INTRAVENOUS | Status: AC
  Filled 2020-07-01: qty 10

## 2020-07-01 MED ORDER — CEFAZOLIN SODIUM-DEXTROSE 1-4 GM/50ML-% IV SOLN
INTRAVENOUS | Status: AC
  Filled 2020-07-01: qty 50

## 2020-07-01 MED ORDER — LIDOCAINE HCL (CARDIAC) PF 100 MG/5ML IV SOSY
PREFILLED_SYRINGE | INTRAVENOUS | Status: DC | PRN
  Administered 2020-07-01: 60 mg via INTRAVENOUS

## 2020-07-01 MED ORDER — DEXAMETHASONE SODIUM PHOSPHATE 10 MG/ML IJ SOLN
INTRAMUSCULAR | Status: DC | PRN
  Administered 2020-07-01: 5 mg via INTRAVENOUS

## 2020-07-01 MED ORDER — FENTANYL CITRATE (PF) 100 MCG/2ML IJ SOLN
INTRAMUSCULAR | Status: DC | PRN
  Administered 2020-07-01 (×2): 25 ug via INTRAVENOUS

## 2020-07-01 MED ORDER — SUCCINYLCHOLINE CHLORIDE 20 MG/ML IJ SOLN
INTRAMUSCULAR | Status: DC | PRN
  Administered 2020-07-01: 120 mg via INTRAVENOUS

## 2020-07-01 MED ORDER — LACTATED RINGERS IV SOLN: INTRAVENOUS | Status: DC

## 2020-07-01 MED ORDER — FENTANYL CITRATE (PF) 100 MCG/2ML IJ SOLN
100.0000 ug | Freq: Once | INTRAMUSCULAR | Status: AC
  Administered 2020-07-01: 100 ug via INTRAVENOUS

## 2020-07-01 MED ORDER — ONDANSETRON HCL 4 MG/2ML IJ SOLN
INTRAMUSCULAR | Status: AC
  Filled 2020-07-01: qty 2

## 2020-07-01 MED ORDER — DEXAMETHASONE SODIUM PHOSPHATE 10 MG/ML IJ SOLN
INTRAMUSCULAR | Status: AC
  Filled 2020-07-01: qty 1

## 2020-07-01 MED ORDER — MIDAZOLAM HCL 2 MG/2ML IJ SOLN
INTRAMUSCULAR | Status: AC
  Filled 2020-07-01: qty 2

## 2020-07-01 MED ORDER — ONDANSETRON HCL 4 MG/2ML IJ SOLN
INTRAMUSCULAR | Status: DC | PRN
  Administered 2020-07-01: 4 mg via INTRAVENOUS

## 2020-07-01 MED ORDER — MIDAZOLAM HCL 2 MG/2ML IJ SOLN
2.0000 mg | Freq: Once | INTRAMUSCULAR | Status: AC
  Administered 2020-07-01: 2 mg via INTRAVENOUS

## 2020-07-01 MED ORDER — ROPIVACAINE HCL 5 MG/ML IJ SOLN
INTRAMUSCULAR | Status: DC | PRN
  Administered 2020-07-01: 30 mL via PERINEURAL

## 2020-07-01 MED ORDER — CEFAZOLIN SODIUM-DEXTROSE 2-4 GM/100ML-% IV SOLN
INTRAVENOUS | Status: AC
  Filled 2020-07-01: qty 100

## 2020-07-01 MED ORDER — LIDOCAINE HCL (PF) 2 % IJ SOLN
INTRAMUSCULAR | Status: AC
  Filled 2020-07-01: qty 5

## 2020-07-01 MED ORDER — ACETAMINOPHEN 500 MG PO TABS
1000.0000 mg | ORAL_TABLET | ORAL | Status: AC
  Administered 2020-07-01: 1000 mg via ORAL

## 2020-07-01 MED ORDER — FENTANYL CITRATE (PF) 100 MCG/2ML IJ SOLN
INTRAMUSCULAR | Status: AC
  Filled 2020-07-01: qty 2

## 2020-07-01 MED ORDER — BUPIVACAINE-EPINEPHRINE 0.5% -1:200000 IJ SOLN
INTRAMUSCULAR | Status: DC | PRN
  Administered 2020-07-01: 6 mL

## 2020-07-01 MED ORDER — ACETAMINOPHEN 500 MG PO TABS
ORAL_TABLET | ORAL | Status: AC
  Filled 2020-07-01: qty 2

## 2020-07-01 MED ORDER — CHLORHEXIDINE GLUCONATE CLOTH 2 % EX PADS: 6.0000 | MEDICATED_PAD | Freq: Once | CUTANEOUS | Status: DC

## 2020-07-01 SURGICAL SUPPLY — 52 items
ADH SKN CLS APL DERMABOND .7 (GAUZE/BANDAGES/DRESSINGS) ×1
APL PRP STRL LF DISP 70% ISPRP (MISCELLANEOUS) ×1
APPLIER CLIP 9.375 MED OPEN (MISCELLANEOUS) ×3
APR CLP MED 9.3 20 MLT OPN (MISCELLANEOUS) ×1
BLADE SURG 15 STRL LF DISP TIS (BLADE) ×1 IMPLANT
BLADE SURG 15 STRL SS (BLADE) ×3
CANISTER SUCT 1200ML W/VALVE (MISCELLANEOUS) ×3 IMPLANT
CHLORAPREP W/TINT 26 (MISCELLANEOUS) ×3 IMPLANT
CLIP APPLIE 9.375 MED OPEN (MISCELLANEOUS) IMPLANT
COVER BACK TABLE 60X90IN (DRAPES) ×3 IMPLANT
COVER MAYO STAND STRL (DRAPES) ×3 IMPLANT
COVER WAND RF STERILE (DRAPES) IMPLANT
DECANTER SPIKE VIAL GLASS SM (MISCELLANEOUS) IMPLANT
DERMABOND ADVANCED (GAUZE/BANDAGES/DRESSINGS) ×2
DERMABOND ADVANCED .7 DNX12 (GAUZE/BANDAGES/DRESSINGS) ×2 IMPLANT
DRAIN CHANNEL 19F RND (DRAIN) IMPLANT
DRAIN PENROSE 12X.25 LTX STRL (MISCELLANEOUS) IMPLANT
DRAPE LAPAROSCOPIC ABDOMINAL (DRAPES) ×2 IMPLANT
DRAPE LAPAROTOMY 100X72 PEDS (DRAPES) ×1 IMPLANT
DRAPE UTILITY XL STRL (DRAPES) ×3 IMPLANT
ELECT REM PT RETURN 9FT ADLT (ELECTROSURGICAL) ×3
ELECTRODE REM PT RTRN 9FT ADLT (ELECTROSURGICAL) ×1 IMPLANT
EVACUATOR SILICONE 100CC (DRAIN) IMPLANT
GLOVE SURG POLYISO LF SZ6.5 (GLOVE) ×2 IMPLANT
GLOVE SURG POLYISO LF SZ8 (GLOVE) ×2 IMPLANT
GLOVE SURG SIGNA 7.5 PF LTX (GLOVE) ×3 IMPLANT
GLOVE SURG UNDER POLY LF SZ7 (GLOVE) ×2 IMPLANT
GLOVE SURG UNDER POLY LF SZ7.5 (GLOVE) ×2 IMPLANT
GOWN STRL REUS W/ TWL LRG LVL3 (GOWN DISPOSABLE) ×1 IMPLANT
GOWN STRL REUS W/ TWL XL LVL3 (GOWN DISPOSABLE) ×1 IMPLANT
GOWN STRL REUS W/TWL 2XL LVL3 (GOWN DISPOSABLE) ×2 IMPLANT
GOWN STRL REUS W/TWL LRG LVL3 (GOWN DISPOSABLE) ×3
GOWN STRL REUS W/TWL XL LVL3 (GOWN DISPOSABLE) ×3
NDL HYPO 25X1 1.5 SAFETY (NEEDLE) ×1 IMPLANT
NEEDLE HYPO 25X1 1.5 SAFETY (NEEDLE) ×3 IMPLANT
NS IRRIG 1000ML POUR BTL (IV SOLUTION) ×3 IMPLANT
PACK BASIN DAY SURGERY FS (CUSTOM PROCEDURE TRAY) ×3 IMPLANT
PENCIL SMOKE EVACUATOR (MISCELLANEOUS) ×3 IMPLANT
PIN SAFETY STERILE (MISCELLANEOUS) ×1 IMPLANT
SLEEVE SCD COMPRESS KNEE MED (STOCKING) ×3 IMPLANT
SPONGE LAP 4X18 RFD (DISPOSABLE) ×5 IMPLANT
STAPLER VISISTAT 35W (STAPLE) IMPLANT
SUT ETHILON 2 0 FS 18 (SUTURE) IMPLANT
SUT MNCRL AB 4-0 PS2 18 (SUTURE) ×3 IMPLANT
SUT VIC AB 3-0 SH 27 (SUTURE) ×3
SUT VIC AB 3-0 SH 27X BRD (SUTURE) IMPLANT
SYR BULB EAR ULCER 3OZ GRN STR (SYRINGE) ×2 IMPLANT
SYR CONTROL 10ML LL (SYRINGE) ×3 IMPLANT
TOWEL GREEN STERILE FF (TOWEL DISPOSABLE) ×3 IMPLANT
TUBE CONNECTING 20'X1/4 (TUBING) ×1
TUBE CONNECTING 20X1/4 (TUBING) ×2 IMPLANT
YANKAUER SUCT BULB TIP NO VENT (SUCTIONS) ×3 IMPLANT

## 2020-07-01 NOTE — Op Note (Signed)
EXCISIONAL BIOPSY RIGHT AXILLARY LYMPH NODE  Procedure Note  Miranda Padilla 07/01/2020   Pre-op Diagnosis: LYMPHADENOPATHY     Post-op Diagnosis: same  Procedure(s): EXCISIONAL BIOPSY DEEP RIGHT AXILLARY LYMPH NODEs  Surgeon(s): Coralie Keens, MD  Anesthesia: General  Staff:  Circulator: Ted Mcalpine, RN Scrub Person: Kandis Nab  Estimated Blood Loss: Minimal               Specimens: sent to path  Indications: This is a 55 year old female with progressive lymphadenopathy in her pelvis, retroperitoneum, chest, and axilla.  The decision has been made to proceed with excisional biopsy of several lymph nodes for histologic evaluation to rule out malignancy  Procedure: The patient was brought to operating identifies correct patient.  She is placed upon the operating table and general anesthesia was induced.  Her right axilla was prepped and draped in usual sterile fashion.  I anesthetized skin with Marcaine and then made an incision with a scalpel.  I then dissected down to the deep right axillary tissue.  The enlarged lymph nodes were located deep in the axilla and the chest wall.  I was able to grasp several nodes and excise several en bloc.  There were fairly necrotic.  There were sent to pathology for evaluation.  I clipped several bridging vessels and lymphatics with surgical clips.  At this point hemostasis peer to be achieved.  I thoroughly irrigated the wound with saline.  I then closed the deep subcutaneous tissue with interrupted 3-0 Vicryl sutures.  I then closed the superficial subcutaneous tissue with interrupted 3-0 Vicryl sutures and the skin with a running 4-0 Monocryl.  Dermabond was then applied.  The patient tolerated the procedure well.  All the counts were correct at the end of the procedure.  The patient was then extubated in the operating room and taken in stable addition to the recovery room.          Coralie Keens   Date: 07/01/2020  Time:  1:24 PM

## 2020-07-01 NOTE — Anesthesia Procedure Notes (Signed)
Procedure Name: LMA Insertion Date/Time: 07/01/2020 12:45 PM Performed by: Lavonia Dana, CRNA Pre-anesthesia Checklist: Patient identified, Emergency Drugs available, Suction available and Patient being monitored Patient Re-evaluated:Patient Re-evaluated prior to induction Oxygen Delivery Method: Circle system utilized Preoxygenation: Pre-oxygenation with 100% oxygen Induction Type: IV induction Ventilation: Mask ventilation without difficulty LMA: LMA inserted LMA Size: 4.0 Number of attempts: 1 Airway Equipment and Method: Bite block Placement Confirmation: positive ETCO2 Tube secured with: Tape Dental Injury: Teeth and Oropharynx as per pre-operative assessment

## 2020-07-01 NOTE — Progress Notes (Signed)
Assisted Dr. Miller with right, ultrasound guided, pectoralis block. Side rails up, monitors on throughout procedure. See vital signs in flow sheet. Tolerated Procedure well. 

## 2020-07-01 NOTE — Anesthesia Procedure Notes (Signed)
Anesthesia Regional Block: Pectoralis block   Pre-Anesthetic Checklist: , timeout performed,  Correct Patient, Correct Site, Correct Laterality,  Correct Procedure, Correct Position, site marked,  Risks and benefits discussed,  Surgical consent,  Pre-op evaluation,  At surgeon's request and post-op pain management  Laterality: Right  Prep: chloraprep       Needles:  Injection technique: Single-shot  Needle Type: Stimiplex     Needle Length: 9cm  Needle Gauge: 21     Additional Needles:   Procedures:,,,, ultrasound used (permanent image in chart),,    Narrative:  Start time: 07/01/2020 11:34 AM End time: 07/01/2020 11:39 AM Injection made incrementally with aspirations every 5 mL.  Performed by: Personally  Anesthesiologist: Lynda Rainwater, MD

## 2020-07-01 NOTE — Anesthesia Preprocedure Evaluation (Signed)
Anesthesia Evaluation  Patient identified by MRN, date of birth, ID band  Reviewed: Allergy & Precautions, NPO status , Patient's Chart, lab work & pertinent test results  Airway Mallampati: II  TM Distance: >3 FB Neck ROM: Full    Dental no notable dental hx.    Pulmonary shortness of breath, asthma ,    Pulmonary exam normal breath sounds clear to auscultation (-) decreased breath sounds      Cardiovascular Normal cardiovascular exam Rhythm:Regular Rate:Normal     Neuro/Psych  Headaches, Anxiety Depression    GI/Hepatic GERD  ,  Endo/Other  Morbid obesity  Renal/GU      Musculoskeletal  (+) Arthritis , Osteoarthritis,    Abdominal (+) + obese,   Peds  Hematology  (+) anemia ,   Anesthesia Other Findings   Reproductive/Obstetrics                             Anesthesia Physical  Anesthesia Plan  ASA: III  Anesthesia Plan: General   Post-op Pain Management:  Regional for Post-op pain   Induction: Intravenous  PONV Risk Score and Plan: 3 and Ondansetron, Dexamethasone, Midazolam and Treatment may vary due to age or medical condition  Airway Management Planned: LMA  Additional Equipment:   Intra-op Plan:   Post-operative Plan: Extubation in OR  Informed Consent: I have reviewed the patients History and Physical, chart, labs and discussed the procedure including the risks, benefits and alternatives for the proposed anesthesia with the patient or authorized representative who has indicated his/her understanding and acceptance.     Dental advisory given  Plan Discussed with: CRNA  Anesthesia Plan Comments:         Anesthesia Quick Evaluation

## 2020-07-01 NOTE — Transfer of Care (Signed)
Immediate Anesthesia Transfer of Care Note  Patient: Miranda Padilla  Procedure(s) Performed: EXCISIONAL BIOPSY RIGHT AXILLARY LYMPH NODE (Right: Axilla)  Patient Location: PACU  Anesthesia Type:GA combined with regional for post-op pain  Level of Consciousness: drowsy  Airway & Oxygen Therapy: Patient Spontanous Breathing and Patient connected to face mask oxygen  Post-op Assessment: Report given to RN and Post -op Vital signs reviewed and stable  Post vital signs: Reviewed and stable  Last Vitals:  Vitals Value Taken Time  BP    Temp    Pulse    Resp    SpO2      Last Pain:  Vitals:   07/01/20 1110  TempSrc: Oral  PainSc: 5          Complications: No notable events documented.

## 2020-07-01 NOTE — Discharge Instructions (Addendum)
Next dose of Tylenol can be given at 5:30pm if needed.   You may shower starting tomorrow  Ice pack, Tylenol, and ibuprofen also for pain   No vigorous activity for 1 week  You will be called with the results of the pathology are known    Post Anesthesia Home Care Instructions  Activity: Get plenty of rest for the remainder of the day. A responsible individual must stay with you for 24 hours following the procedure.  For the next 24 hours, DO NOT: -Drive a car -Paediatric nurse -Drink alcoholic beverages -Take any medication unless instructed by your physician -Make any legal decisions or sign important papers.  Meals: Start with liquid foods such as gelatin or soup. Progress to regular foods as tolerated. Avoid greasy, spicy, heavy foods. If nausea and/or vomiting occur, drink only clear liquids until the nausea and/or vomiting subsides. Call your physician if vomiting continues.  Special Instructions/Symptoms: Your throat may feel dry or sore from the anesthesia or the breathing tube placed in your throat during surgery. If this causes discomfort, gargle with warm salt water. The discomfort should disappear within 24 hours.

## 2020-07-01 NOTE — Interval H&P Note (Signed)
History and Physical Interval Note: no change in H and P  07/01/2020 12:01 PM  Miranda Padilla  has presented today for surgery, with the diagnosis of LYMPHADENOPATHY.  The various methods of treatment have been discussed with the patient and family. After consideration of risks, benefits and other options for treatment, the patient has consented to  Procedure(s): EXCISIONAL BIOPSY RIGHT AXILLARY LYMPH NODE (Right) as a surgical intervention.  The patient's history has been reviewed, patient examined, no change in status, stable for surgery.  I have reviewed the patient's chart and labs.  Questions were answered to the patient's satisfaction.     Coralie Keens

## 2020-07-02 ENCOUNTER — Encounter (HOSPITAL_BASED_OUTPATIENT_CLINIC_OR_DEPARTMENT_OTHER): Payer: Self-pay | Admitting: Surgery

## 2020-07-02 NOTE — Anesthesia Postprocedure Evaluation (Signed)
Anesthesia Post Note  Patient: Miranda Padilla  Procedure(s) Performed: EXCISIONAL BIOPSY RIGHT AXILLARY LYMPH NODE (Right: Axilla)     Patient location during evaluation: PACU Anesthesia Type: General Level of consciousness: awake and alert Pain management: pain level controlled Vital Signs Assessment: post-procedure vital signs reviewed and stable Respiratory status: spontaneous breathing, nonlabored ventilation and respiratory function stable Cardiovascular status: blood pressure returned to baseline and stable Postop Assessment: no apparent nausea or vomiting Anesthetic complications: no   No notable events documented.  Last Vitals:  Vitals:   07/01/20 1349 07/01/20 1406  BP:  (!) 140/94  Pulse: 80 88  Resp: 15 16  Temp:  36.7 C  SpO2: 97% 98%    Last Pain:  Vitals:   07/01/20 1356  TempSrc:   PainSc: 0-No pain                 Lynda Rainwater

## 2020-07-08 LAB — SURGICAL PATHOLOGY

## 2020-07-14 ENCOUNTER — Telehealth: Payer: Self-pay | Admitting: *Deleted

## 2020-07-14 ENCOUNTER — Inpatient Hospital Stay: Payer: 59 | Admitting: Family

## 2020-07-14 ENCOUNTER — Inpatient Hospital Stay: Payer: 59 | Attending: Family

## 2020-07-14 ENCOUNTER — Encounter: Payer: Self-pay | Admitting: Family

## 2020-07-14 ENCOUNTER — Other Ambulatory Visit: Payer: Self-pay

## 2020-07-14 VITALS — BP 122/94 | HR 84 | Temp 98.3°F | Resp 18 | Ht 65.0 in | Wt 306.0 lb

## 2020-07-14 DIAGNOSIS — M549 Dorsalgia, unspecified: Secondary | ICD-10-CM | POA: Insufficient documentation

## 2020-07-14 DIAGNOSIS — G8929 Other chronic pain: Secondary | ICD-10-CM | POA: Diagnosis not present

## 2020-07-14 DIAGNOSIS — M5432 Sciatica, left side: Secondary | ICD-10-CM | POA: Diagnosis not present

## 2020-07-14 DIAGNOSIS — R202 Paresthesia of skin: Secondary | ICD-10-CM | POA: Insufficient documentation

## 2020-07-14 DIAGNOSIS — R590 Localized enlarged lymph nodes: Secondary | ICD-10-CM | POA: Diagnosis not present

## 2020-07-14 DIAGNOSIS — D509 Iron deficiency anemia, unspecified: Secondary | ICD-10-CM

## 2020-07-14 DIAGNOSIS — M7989 Other specified soft tissue disorders: Secondary | ICD-10-CM | POA: Insufficient documentation

## 2020-07-14 DIAGNOSIS — D573 Sickle-cell trait: Secondary | ICD-10-CM | POA: Insufficient documentation

## 2020-07-14 DIAGNOSIS — R200 Anesthesia of skin: Secondary | ICD-10-CM | POA: Insufficient documentation

## 2020-07-14 DIAGNOSIS — Z79899 Other long term (current) drug therapy: Secondary | ICD-10-CM | POA: Insufficient documentation

## 2020-07-14 DIAGNOSIS — M5431 Sciatica, right side: Secondary | ICD-10-CM | POA: Insufficient documentation

## 2020-07-14 DIAGNOSIS — R599 Enlarged lymph nodes, unspecified: Secondary | ICD-10-CM

## 2020-07-14 DIAGNOSIS — D563 Thalassemia minor: Secondary | ICD-10-CM | POA: Diagnosis not present

## 2020-07-14 LAB — CMP (CANCER CENTER ONLY)
ALT: 18 U/L (ref 0–44)
AST: 17 U/L (ref 15–41)
Albumin: 3.8 g/dL (ref 3.5–5.0)
Alkaline Phosphatase: 61 U/L (ref 38–126)
Anion gap: 8 (ref 5–15)
BUN: 16 mg/dL (ref 6–20)
CO2: 26 mmol/L (ref 22–32)
Calcium: 9.4 mg/dL (ref 8.9–10.3)
Chloride: 106 mmol/L (ref 98–111)
Creatinine: 0.76 mg/dL (ref 0.44–1.00)
GFR, Estimated: >60 mL/min (ref >60)
Glucose, Bld: 98 mg/dL (ref 70–99)
Potassium: 4 mmol/L (ref 3.5–5.1)
Sodium: 140 mmol/L (ref 135–145)
Total Bilirubin: 0.7 mg/dL (ref 0.3–1.2)
Total Protein: 7.6 g/dL (ref 6.5–8.1)

## 2020-07-14 LAB — CBC WITH DIFFERENTIAL (CANCER CENTER ONLY)
Abs Immature Granulocytes: 0.03 10*3/uL (ref 0.00–0.07)
Basophils Absolute: 0 10*3/uL (ref 0.0–0.1)
Basophils Relative: 1 %
Eosinophils Absolute: 0.2 10*3/uL (ref 0.0–0.5)
Eosinophils Relative: 5 %
HCT: 34 % — ABNORMAL LOW (ref 36.0–46.0)
Hemoglobin: 10.7 g/dL — ABNORMAL LOW (ref 12.0–15.0)
Immature Granulocytes: 1 %
Lymphocytes Relative: 37 %
Lymphs Abs: 1.2 10*3/uL (ref 0.7–4.0)
MCH: 25.2 pg — ABNORMAL LOW (ref 26.0–34.0)
MCHC: 31.5 g/dL (ref 30.0–36.0)
MCV: 80 fL (ref 80.0–100.0)
Monocytes Absolute: 0.3 10*3/uL (ref 0.1–1.0)
Monocytes Relative: 10 %
Neutro Abs: 1.5 10*3/uL — ABNORMAL LOW (ref 1.7–7.7)
Neutrophils Relative %: 46 %
Platelet Count: 291 10*3/uL (ref 150–400)
RBC: 4.25 MIL/uL (ref 3.87–5.11)
RDW: 15.7 % — ABNORMAL HIGH (ref 11.5–15.5)
WBC Count: 3.3 10*3/uL — ABNORMAL LOW (ref 4.0–10.5)
nRBC: 0 % (ref 0.0–0.2)

## 2020-07-14 LAB — RETICULOCYTES
Immature Retic Fract: 16.5 % — ABNORMAL HIGH (ref 2.3–15.9)
RBC.: 4.25 MIL/uL (ref 3.87–5.11)
Retic Count, Absolute: 63.3 10*3/uL (ref 19.0–186.0)
Retic Ct Pct: 1.5 % (ref 0.4–3.1)

## 2020-07-14 NOTE — Progress Notes (Signed)
Hematology and Oncology Follow Up Visit  Miranda Padilla Padilla [id number] 01/08/1966 55 y.o. 07/14/2020   Principle Diagnosis:  Iron deficiency anemia  Sickle cell trait Alpha thalassemia trait Enlarged distal retroperitoneal and pelvic lymph nodes  Current Therapy:   IV iron as indicated  Folic acid 1 mg PO daily   Interim History:  Miranda Padilla Padilla is here today with her husband for follow-up. She had a lymph node excision and biopsy on 07/01/2020. Results reviewed by Dr. Marin Padilla and were negative for malignancy. Her diffuse adenopathy of the chest, abdomen and pelvis appears to be reactive. We advised that she follow-up with rheumatology. She has an appointment with Dr. Joellyn Padilla next week. Her Miranda Padilla Padilla and Methotrexate are still on hold.  She had some bruising with the right axillary lymph node excision. This has healed nicely. No blood loss noted. No petechiae.  She states that she does feel fatigued and had an episodes of vertigo earlier this week that last a few minutes.  She has numbness and tingling in her feet that has felt more intense this week. She has chronic pain in her back, hips and sciatica in her legs.  She has chronic swelling in her lower extremities which she notes is much improved with wearing compression stockings.  No falls or syncope to report.  She has maintained a good appetite and is staying well hydrated. Her weight is stable at 306 lbs.   ECOG Performance Status: 1 - Symptomatic but completely ambulatory  Medications:  Allergies as of 07/14/2020       Reactions   Other Anaphylaxis   Covid 19 vaccine;   Sulfonamide Derivatives Itching, Rash   All over the body.        Medication List        Accurate as of July 14, 2020  1:11 PM. If you have any questions, ask your nurse or doctor.          ergocalciferol 1.25 MG (50000 UT) capsule Commonly known as: VITAMIN D2 1 capsule   folic acid 1 MG tablet Commonly known as: FOLVITE Take 1 tablet  by mouth daily.   furosemide 20 MG tablet Commonly known as: LASIX Take 1 tablet (20 mg total) by mouth daily as needed. Daily as needed for swelling of the lower legs   gabapentin 300 MG capsule Commonly known as: NEURONTIN Take 300 mg by mouth 3 (three) times daily.   Orencia 250 MG injection Generic drug: abatacept See admin instructions.   oxyCODONE 5 MG immediate release tablet Commonly known as: Oxy IR/ROXICODONE Take 1 tablet (5 mg total) by mouth every 6 (six) hours as needed for moderate pain or severe pain.   oxyCODONE-acetaminophen 5-325 MG tablet Commonly known as: Percocet Take 1-2 tablets by mouth every 4 (four) hours as needed.   predniSONE 5 MG tablet Commonly known as: DELTASONE 1tablets   zonisamide 100 MG capsule Commonly known as: ZONEGRAN 100 mg 2 (two) times daily.        Allergies:  Allergies  Allergen Reactions   Other Anaphylaxis    Covid 19 vaccine;   Sulfonamide Derivatives Itching and Rash    All over the body.    Past Medical History, Surgical history, Social history, and Family History were reviewed and updated.  Review of Systems: All other 10 point review of systems is negative.   Physical Exam:  vitals were not taken for this visit.   Wt Readings from Last 3 Encounters:  07/01/20 (!) 307 lb 1.6 oz (  139.3 kg)  05/21/20 (!) 302 lb 1.3 oz (137 kg)  03/17/20 (!) 315 lb (142.9 kg)    Ocular: Sclerae unicteric, pupils equal, round and reactive to light Ear-nose-throat: Oropharynx clear, dentition fair Lymphatic: No cervical or supraclavicular adenopathy Lungs no rales or rhonchi, good excursion bilaterally Heart regular rate and rhythm, no murmur appreciated Abd soft, nontender, positive bowel sounds MSK no focal spinal tenderness, no joint edema Neuro: non-focal, well-oriented, appropriate affect Breasts: Deferred   Lab Results  Component Value Date   WBC 3.3 (L) 07/14/2020   HGB 10.7 (L) 07/14/2020   HCT 34.0 (L)  07/14/2020   MCV 80.0 07/14/2020   PLT 291 07/14/2020   Lab Results  Component Value Date   FERRITIN 217 05/21/2020   IRON 48 05/21/2020   TIBC 325 05/21/2020   UIBC 277 05/21/2020   IRONPCTSAT 15 (L) 05/21/2020   Lab Results  Component Value Date   RETICCTPCT 1.5 07/14/2020   RBC 4.25 07/14/2020   No results found for: KPAFRELGTCHN, LAMBDASER, KAPLAMBRATIO No results found for: IGGSERUM, IGA, IGMSERUM No results found for: Ronnald Ramp, A1GS, A2GS, Tillman Sers, SPEI   Chemistry      Component Value Date/Time   NA 140 07/14/2020 1208   K 4.0 07/14/2020 1208   CL 106 07/14/2020 1208   CO2 26 07/14/2020 1208   BUN 16 07/14/2020 1208   CREATININE 0.76 07/14/2020 1208      Component Value Date/Time   CALCIUM 9.4 07/14/2020 1208   ALKPHOS 61 07/14/2020 1208   AST 17 07/14/2020 1208   ALT 18 07/14/2020 1208   BILITOT 0.7 07/14/2020 1208       Impression and Plan: Miranda Padilla Padilla is a very pleasant 55 yo African American female with iron deficiency anemia and sickle cell trait. She will continue taking folic daily.  Iron studies are pending. We will replace if needed.  Her lymph node biopsy came back negative for malignancy. She follows up with her Rheumatologist next week for further work up and will hopefully be able to resume treatment at that time.  All recent lab results and today's note forwarded to Dr. Joellyn Padilla.  Follow-up in 8 weeks.  She can contact our office with any questions or concerns.   Miranda Peace, NP 6/22/20221:11 PM

## 2020-07-14 NOTE — Telephone Encounter (Signed)
Per 07/14/20 los - gave patient upcoming appointments - patient confirmed - print calendar

## 2020-07-15 LAB — IRON AND TIBC
Iron: 49 ug/dL (ref 41–142)
Saturation Ratios: 16 % — ABNORMAL LOW (ref 21–57)
TIBC: 304 ug/dL (ref 236–444)
UIBC: 255 ug/dL (ref 120–384)

## 2020-07-15 LAB — FERRITIN: Ferritin: 293 ng/mL (ref 11–307)

## 2020-07-19 ENCOUNTER — Telehealth: Payer: Self-pay | Admitting: *Deleted

## 2020-07-19 NOTE — Telephone Encounter (Signed)
Per scheduling message - called and lvm of upcoming appointments - (2) doses of IV Iron - requested a call back to confirm

## 2020-07-19 NOTE — Telephone Encounter (Signed)
Patient called back to confirm upcoming appointments for (2) doses of IV of Iron

## 2020-07-21 ENCOUNTER — Other Ambulatory Visit: Payer: Self-pay

## 2020-07-21 ENCOUNTER — Observation Stay (HOSPITAL_BASED_OUTPATIENT_CLINIC_OR_DEPARTMENT_OTHER)
Admission: EM | Admit: 2020-07-21 | Discharge: 2020-07-22 | Disposition: A | Discharge status: Home / Self Care | Payer: 59 | Attending: Internal Medicine | Admitting: Internal Medicine

## 2020-07-21 ENCOUNTER — Encounter (HOSPITAL_BASED_OUTPATIENT_CLINIC_OR_DEPARTMENT_OTHER): Payer: Self-pay

## 2020-07-21 ENCOUNTER — Ambulatory Visit: Payer: 59

## 2020-07-21 ENCOUNTER — Emergency Department (HOSPITAL_COMMUNITY): Payer: 59

## 2020-07-21 DIAGNOSIS — M5417 Radiculopathy, lumbosacral region: Principal | ICD-10-CM | POA: Insufficient documentation

## 2020-07-21 DIAGNOSIS — G629 Polyneuropathy, unspecified: Secondary | ICD-10-CM | POA: Diagnosis not present

## 2020-07-21 DIAGNOSIS — Z20822 Contact with and (suspected) exposure to covid-19: Secondary | ICD-10-CM | POA: Diagnosis not present

## 2020-07-21 DIAGNOSIS — J45909 Unspecified asthma, uncomplicated: Secondary | ICD-10-CM | POA: Diagnosis not present

## 2020-07-21 DIAGNOSIS — Z79899 Other long term (current) drug therapy: Secondary | ICD-10-CM | POA: Diagnosis not present

## 2020-07-21 DIAGNOSIS — R29898 Other symptoms and signs involving the musculoskeletal system: Secondary | ICD-10-CM | POA: Diagnosis not present

## 2020-07-21 DIAGNOSIS — M545 Low back pain, unspecified: Secondary | ICD-10-CM | POA: Diagnosis present

## 2020-07-21 DIAGNOSIS — R262 Difficulty in walking, not elsewhere classified: Secondary | ICD-10-CM

## 2020-07-21 LAB — CBC WITH DIFFERENTIAL/PLATELET
Abs Immature Granulocytes: 0.03 10*3/uL (ref 0.00–0.07)
Basophils Absolute: 0 10*3/uL (ref 0.0–0.1)
Basophils Relative: 1 %
Eosinophils Absolute: 0.2 10*3/uL (ref 0.0–0.5)
Eosinophils Relative: 6 %
HCT: 35 % — ABNORMAL LOW (ref 36.0–46.0)
Hemoglobin: 11.1 g/dL — ABNORMAL LOW (ref 12.0–15.0)
Immature Granulocytes: 1 %
Lymphocytes Relative: 27 %
Lymphs Abs: 1 10*3/uL (ref 0.7–4.0)
MCH: 25.1 pg — ABNORMAL LOW (ref 26.0–34.0)
MCHC: 31.7 g/dL (ref 30.0–36.0)
MCV: 79.2 fL — ABNORMAL LOW (ref 80.0–100.0)
Monocytes Absolute: 0.3 10*3/uL (ref 0.1–1.0)
Monocytes Relative: 8 %
Neutro Abs: 2.2 10*3/uL (ref 1.7–7.7)
Neutrophils Relative %: 57 %
Platelets: 297 10*3/uL (ref 150–400)
RBC: 4.42 MIL/uL (ref 3.87–5.11)
RDW: 16 % — ABNORMAL HIGH (ref 11.5–15.5)
WBC: 3.8 10*3/uL — ABNORMAL LOW (ref 4.0–10.5)
nRBC: 0 % (ref 0.0–0.2)

## 2020-07-21 LAB — BASIC METABOLIC PANEL
Anion gap: 9 (ref 5–15)
BUN: 18 mg/dL (ref 6–20)
CO2: 23 mmol/L (ref 22–32)
Calcium: 8.8 mg/dL — ABNORMAL LOW (ref 8.9–10.3)
Chloride: 106 mmol/L (ref 98–111)
Creatinine, Ser: 0.72 mg/dL (ref 0.44–1.00)
GFR, Estimated: >60 mL/min (ref >60)
Glucose, Bld: 112 mg/dL — ABNORMAL HIGH (ref 70–99)
Potassium: 4.1 mmol/L (ref 3.5–5.1)
Sodium: 138 mmol/L (ref 135–145)

## 2020-07-21 LAB — RESP PANEL BY RT-PCR (FLU A&B, COVID) ARPGX2
Influenza A by PCR: NEGATIVE
Influenza B by PCR: NEGATIVE
SARS Coronavirus 2 by RT PCR: NEGATIVE

## 2020-07-21 IMAGING — MR MR THORACIC SPINE W/O CM
4 of 6 series · 19 of 48 positions shown · non-contrast
Comparison: None.

CLINICAL DATA: Numbness in both feet and difficulty walking. Back
pain radiating into both legs. Increasing lower extremity weakness.

EXAM:
MRI THORACIC SPINE WITHOUT CONTRAST
TECHNIQUE: Multiplanar, multisequence MR imaging of the thoracic spine was
performed. No intravenous contrast was administered.

[Series 3: T1 · sagittal · 3.0mm · 0.90mm/px · 3 of 12 slices shown (1 of 2)]
[im 1/12]
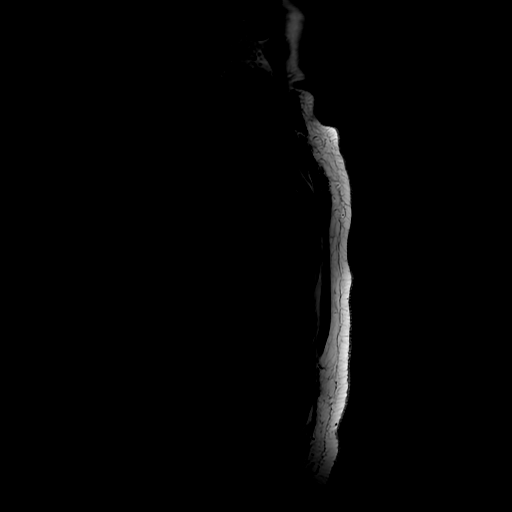
[im 8/12]
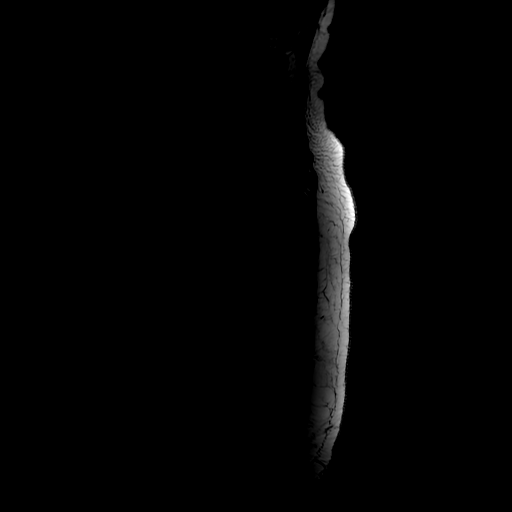
[im 12/12]
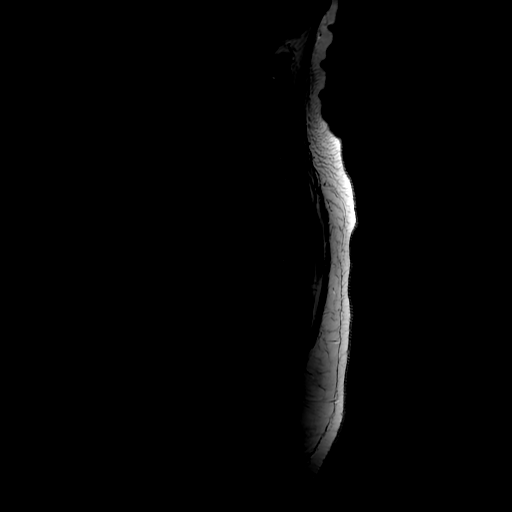

[Series 4: T2 · sagittal · 3.0mm · 0.66mm/px · 6 of 18 slices shown (1 of 2)]
[im 1/18]
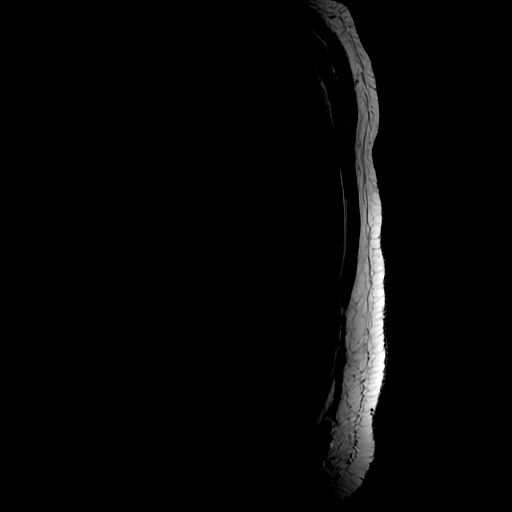
[im 4/18]
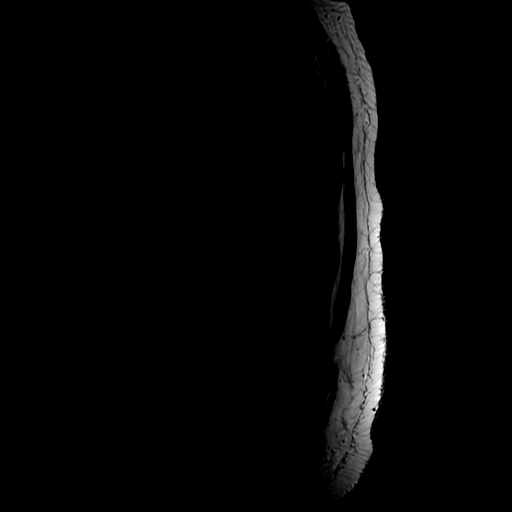
[im 7/18]
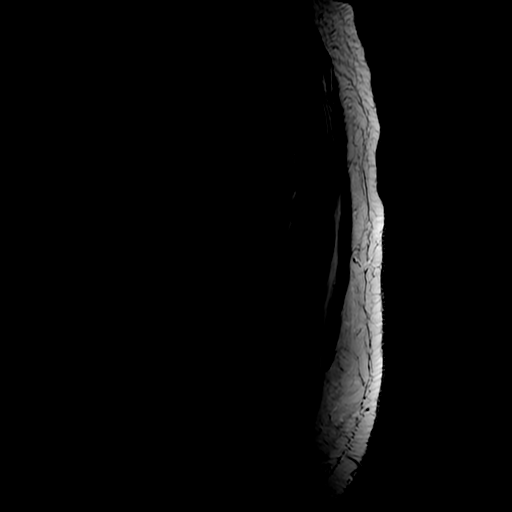
[im 11/18]
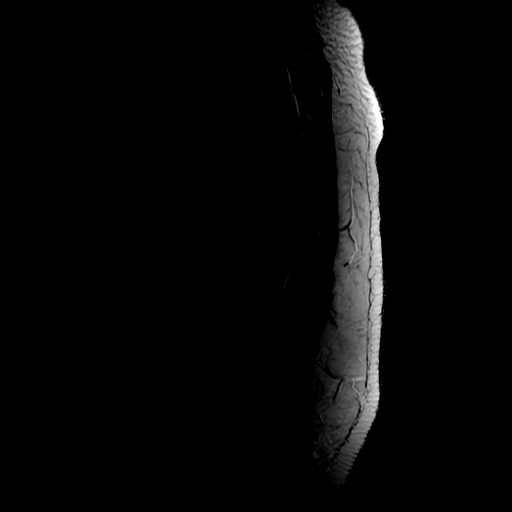
[im 14/18]
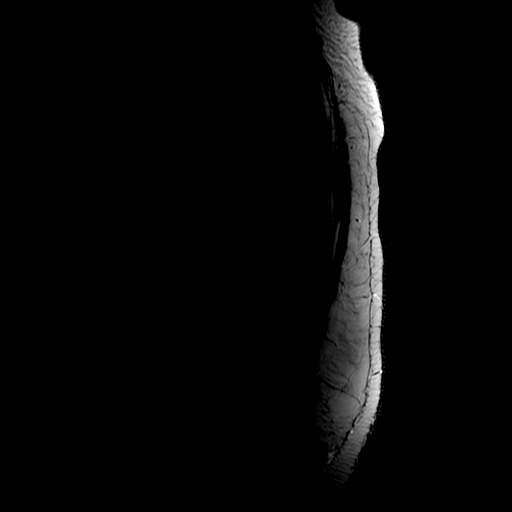
[im 18/18]
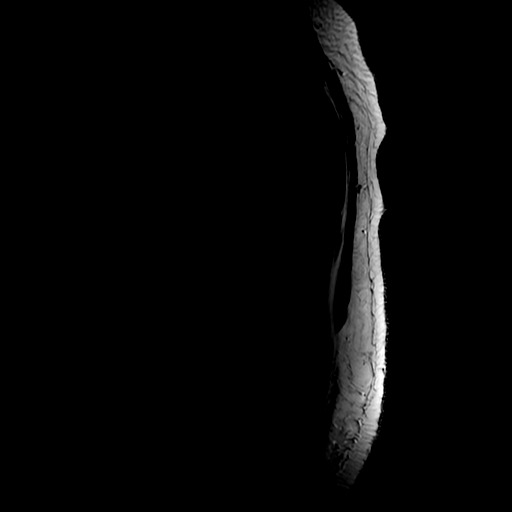

[Series 5: T1 · sagittal · 3.0mm · 0.66mm/px · 3 of 18 slices shown (2 of 2)]
[im 4/18]
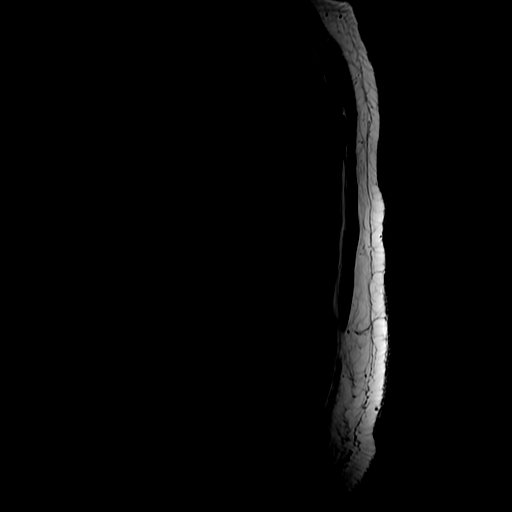
[im 11/18]
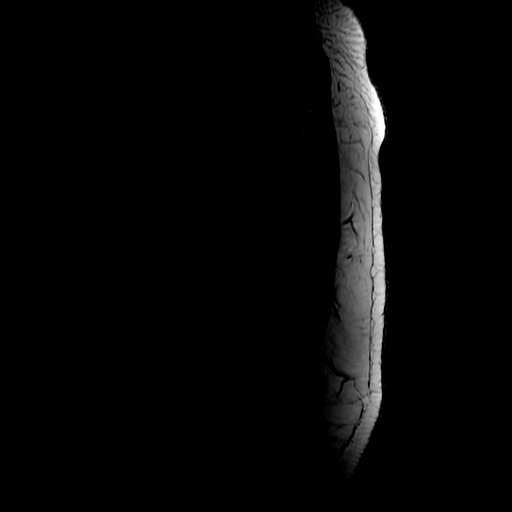
[im 18/18]
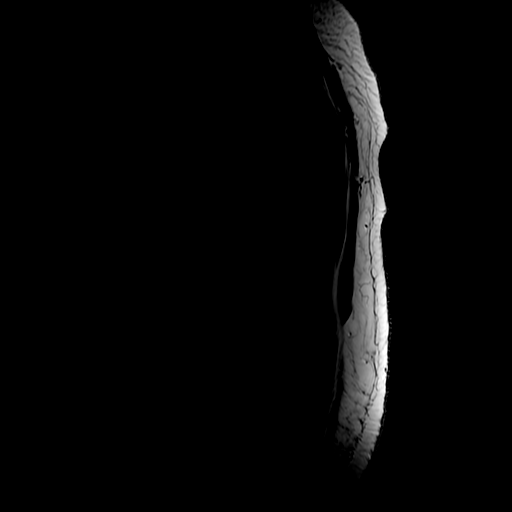

[Series 7: T2 · axial · 4.0mm · 0.39mm/px · z∈[+27,+193]mm · 7 of 36 slices shown (2 of 2)]
[im 1/36]
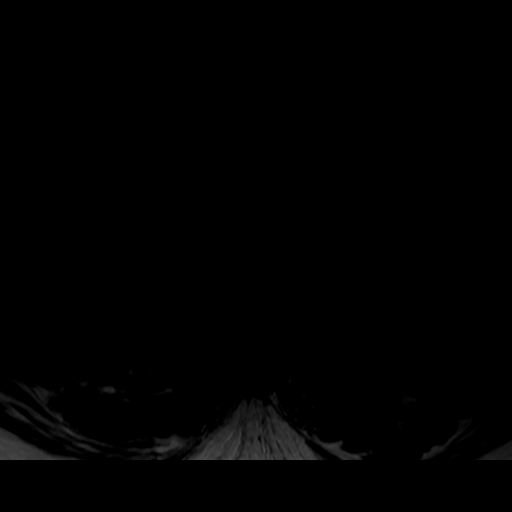
[im 6/36]
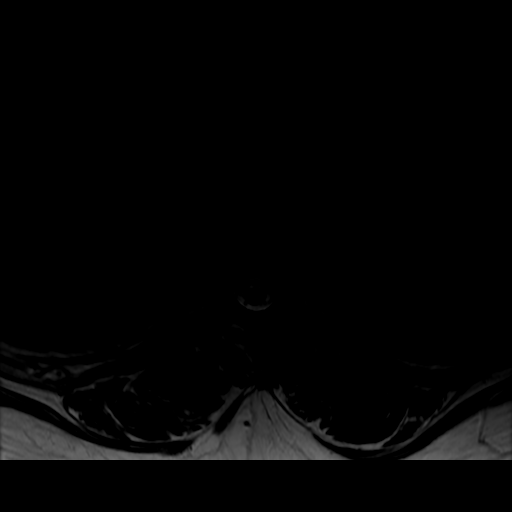
[im 12/36]
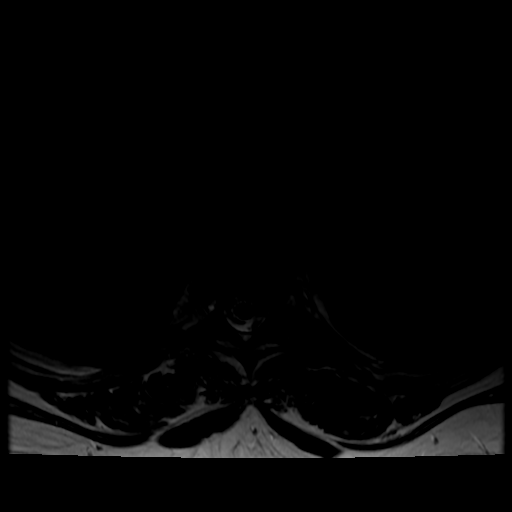
[im 15/36]
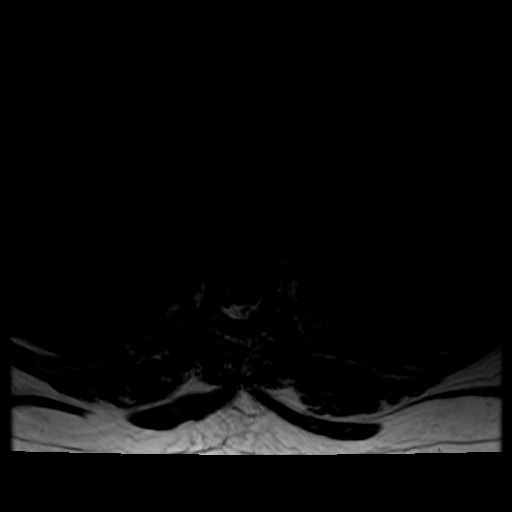
[im 18/36]
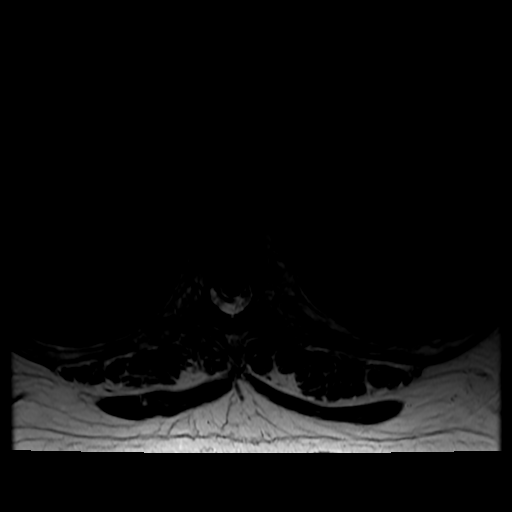
[im 21/36]
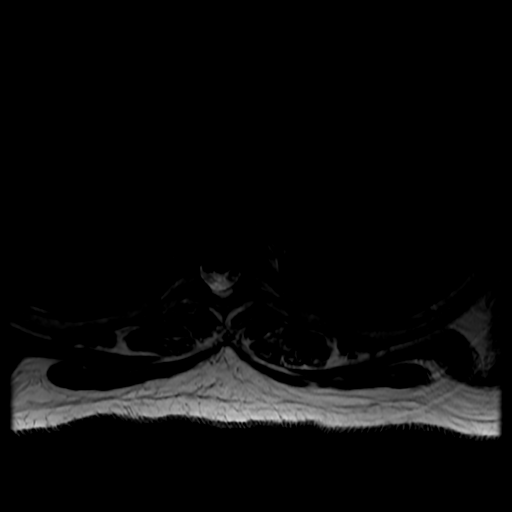
[im 30/36]
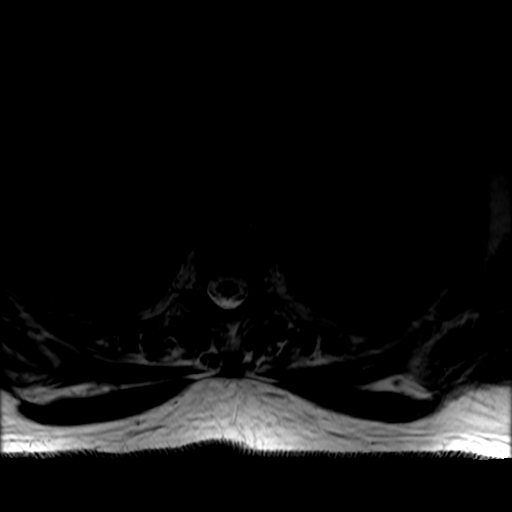

[19 of 48 positions shown; findings below may reference images not displayed]

FINDINGS: Alignment:  Normal.

Vertebrae: No fracture, evidence of discitis, or bone lesion.

Cord: Although patient motion limits evaluation, no cord signal
abnormality is identified.

Paraspinal and other soft tissues: Negative.

Disc levels:

Shallow central protrusions are seen at T4-5, T5-6, T6-7 and T7-8.
There is a minimal disc bulge at T8-9. No stenosis is identified.
IMPRESSION: Limited examination due to patient motion but no cord signal
abnormality is identified.

Mild thoracic degenerative change without stenosis.

## 2020-07-21 IMAGING — MR MR LUMBAR SPINE W/O CM
4 of 5 series · 19 of 48 positions shown · non-contrast
Comparison: MRI lumbar spine with and without contrast [DATE].
CT chest, abdomen and pelvis [DATE].

CLINICAL DATA: Increasing low back pain radiating into both legs.
No known injury.

EXAM:
MRI LUMBAR SPINE WITHOUT CONTRAST
TECHNIQUE: Multiplanar, multisequence MR imaging of the lumbar spine was
performed. No intravenous contrast was administered.

[Series 2: T2 · sagittal · 4.0mm · 0.55mm/px · 6 of 15 slices shown (1 of 2)]
[im 1/15]
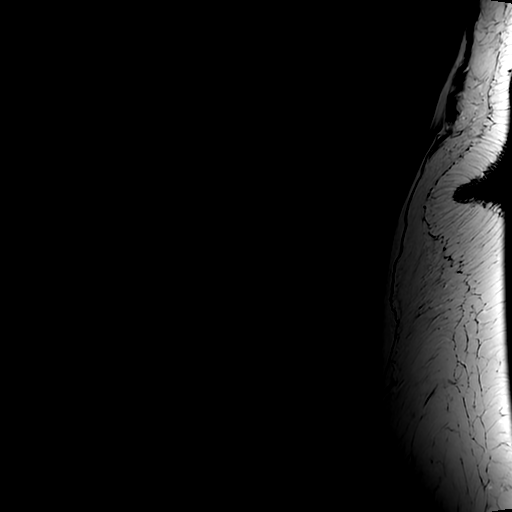
[im 3/15]
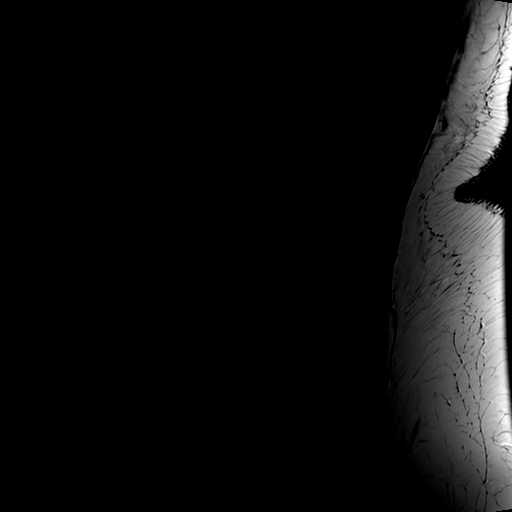
[im 6/15]
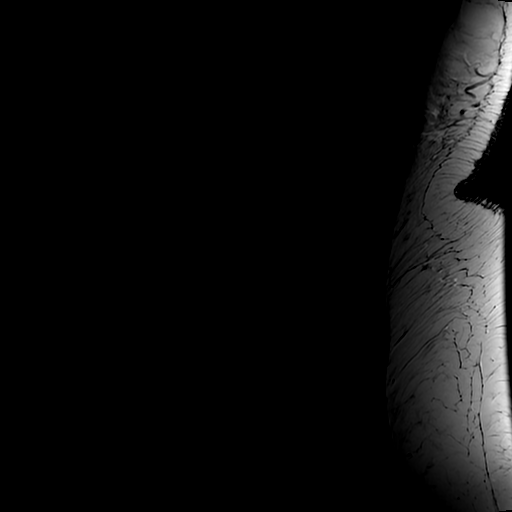
[im 9/15]
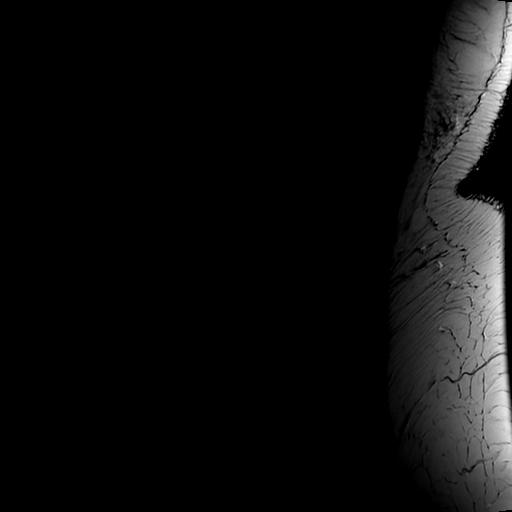
[im 12/15]
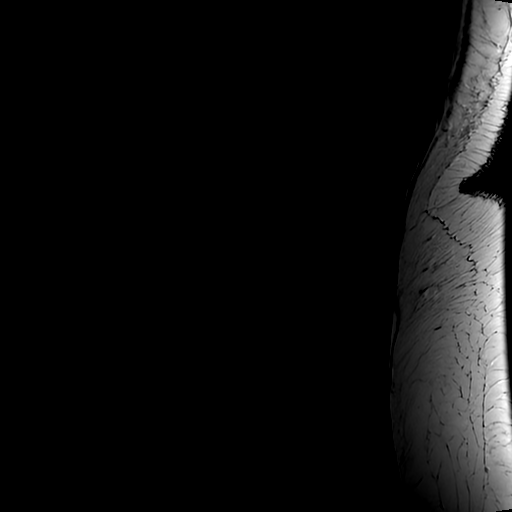
[im 15/15]
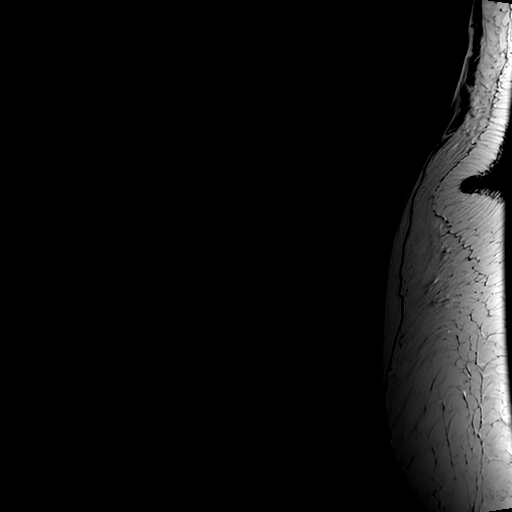

[Series 4: T1 · sagittal · 4.0mm · 0.51mm/px · 3 of 15 slices shown (1 of 2)]
[im 3/15]
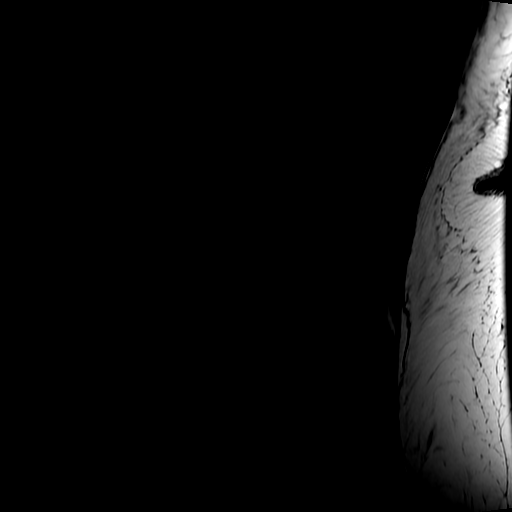
[im 9/15]
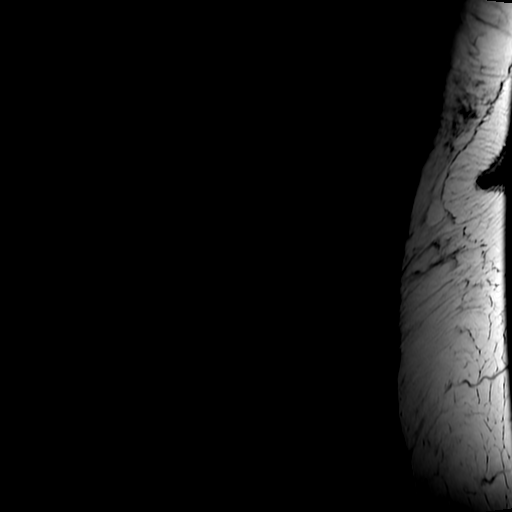
[im 15/15]
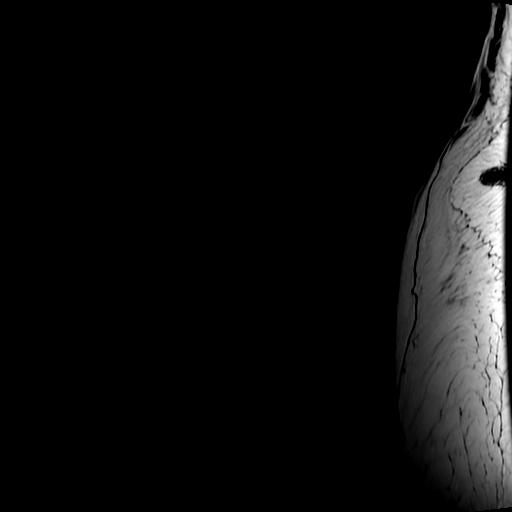

[Series 5: T2 · axial · 4.0mm · 0.39mm/px · z∈[-13,+165]mm · 7 of 39 slices shown (2 of 2)]
[im 1/39]
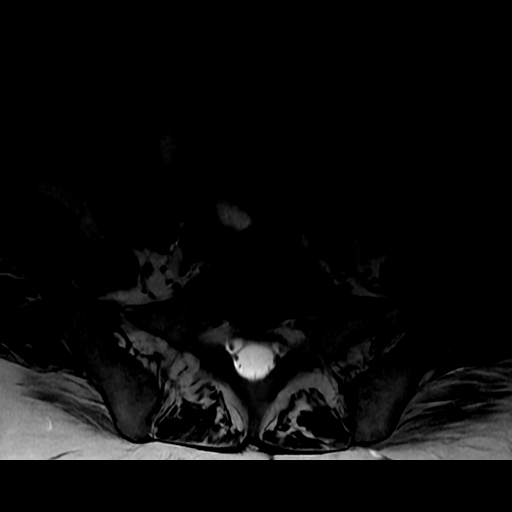
[im 6/39]
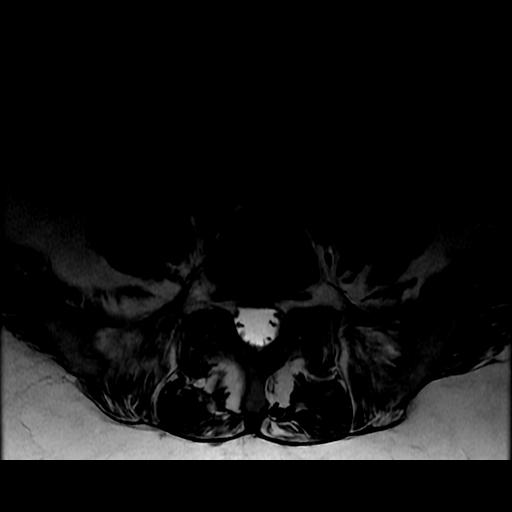
[im 11/39]
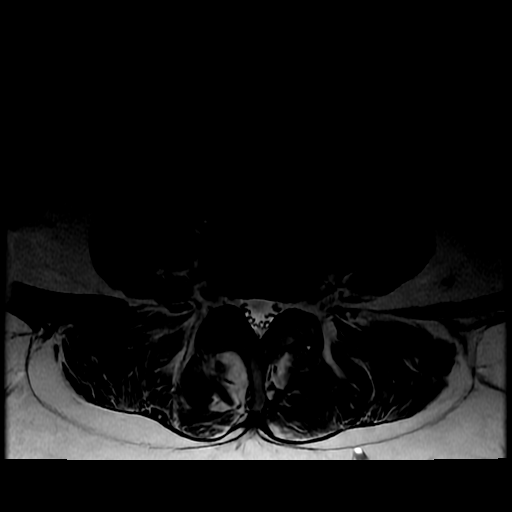
[im 17/39]
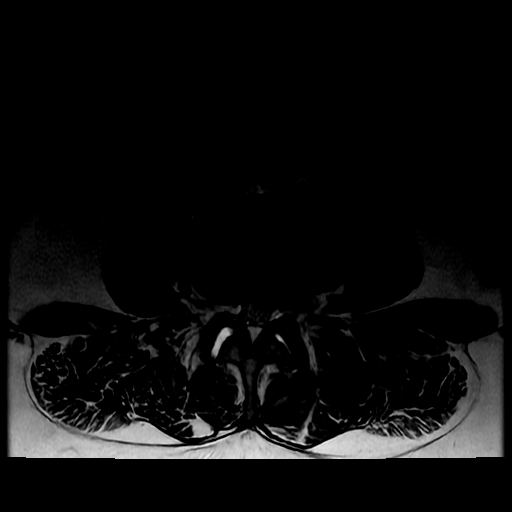
[im 20/39]
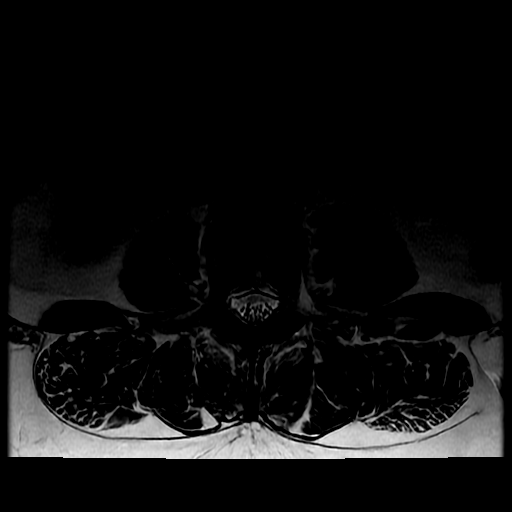
[im 22/39]
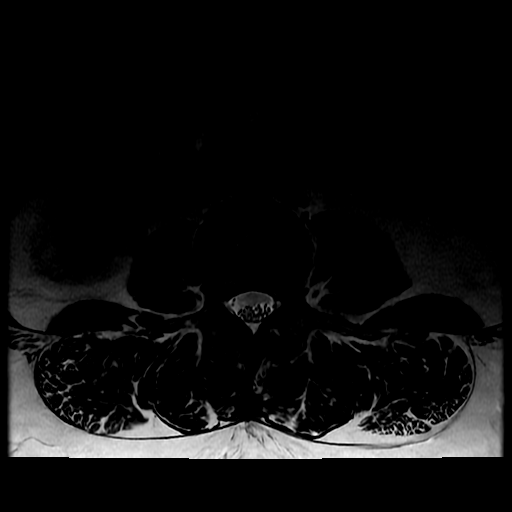
[im 33/39]
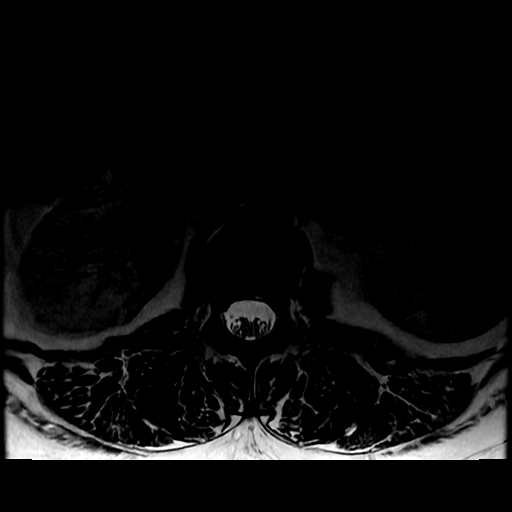

[Series 6: T1 · axial · 4.0mm · 0.39mm/px · z∈[+12,+165]mm · 3 of 39 slices shown (2 of 2)]
[im 6/39]
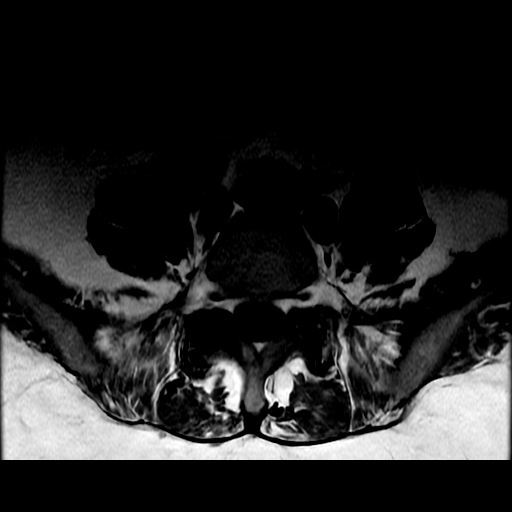
[im 20/39]
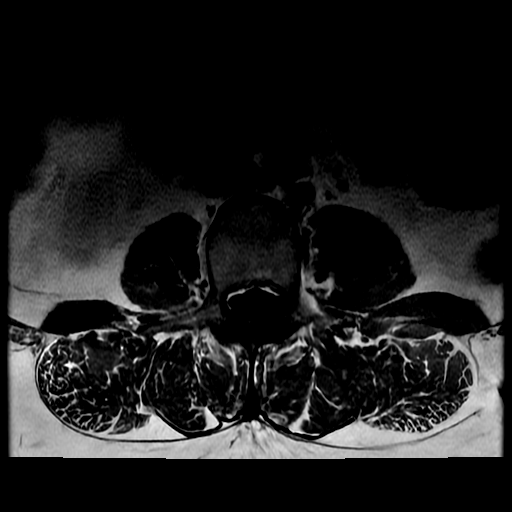
[im 33/39]
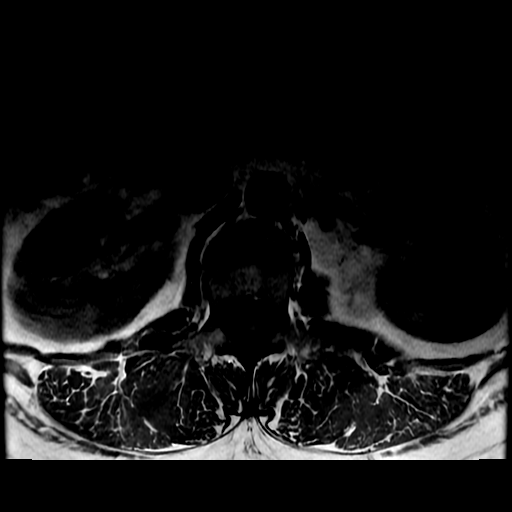

[19 of 48 positions shown; findings below may reference images not displayed]

FINDINGS: Segmentation:  Standard.

Alignment:  Normal.

Vertebrae:  No fracture, evidence of discitis, or bone lesion.

Conus medullaris and cauda equina: Conus extends to the L1-2 level.
Conus and cauda equina appear normal.

Paraspinal and other soft tissues: Again seen is retroperitoneal
lymphadenopathy which is not notably changed compared to the prior
CT.

Disc levels:

L1-2: Negative.

L2-3: Negative.

L3-4: Bilateral facet degenerative disease with associated
effusions, greater on the right. There is ligamentum flavum
thickening and a disc bulge. A superimposed left subarticular recess
and foraminal protrusion appears slightly increased in size compared
to the prior exam. There is mild narrowing in the left subarticular
recess and foramen. Mild central canal stenosis is present. The
right foramen is open.

L4-5: Shallow disc bulge and bilateral facet degenerative disease.
There is some ligamentum flavum thickening. No stenosis or change.

L5-S1: Negative for disc bulge or protrusion. Bilateral facet
degenerative change. No stenosis.
IMPRESSION: Mild progression of a left subarticular recess and foraminal
protrusion at L3-4 causing mild narrowing in the subarticular recess
and foramen. No nerve root compression is identified.

No change in multilevel facet arthropathy.

## 2020-07-21 MED ORDER — DULOXETINE HCL 20 MG PO CPEP
20.0000 mg | ORAL_CAPSULE | Freq: Every day | ORAL | Status: DC
  Administered 2020-07-22: 20 mg via ORAL
  Filled 2020-07-21 (×2): qty 1

## 2020-07-21 MED ORDER — ONDANSETRON HCL 4 MG/2ML IJ SOLN
4.0000 mg | Freq: Once | INTRAMUSCULAR | Status: AC
  Administered 2020-07-21: 4 mg via INTRAVENOUS
  Filled 2020-07-21: qty 2

## 2020-07-21 MED ORDER — HYDROMORPHONE HCL 1 MG/ML IJ SOLN
1.0000 mg | Freq: Once | INTRAMUSCULAR | Status: AC
  Administered 2020-07-21: 1 mg via INTRAVENOUS
  Filled 2020-07-21: qty 1

## 2020-07-21 MED ORDER — GABAPENTIN 300 MG PO CAPS
600.0000 mg | ORAL_CAPSULE | Freq: Every day | ORAL | Status: DC
  Administered 2020-07-22: 600 mg via ORAL
  Filled 2020-07-21: qty 2

## 2020-07-21 MED ORDER — ACETAMINOPHEN 500 MG PO TABS
1000.0000 mg | ORAL_TABLET | Freq: Four times a day (QID) | ORAL | Status: DC | PRN
  Administered 2020-07-22: 1000 mg via ORAL
  Filled 2020-07-21: qty 2

## 2020-07-21 MED ORDER — CAPSAICIN 0.025 % EX CREA
TOPICAL_CREAM | Freq: Two times a day (BID) | CUTANEOUS | Status: DC
  Filled 2020-07-21: qty 60

## 2020-07-21 MED ORDER — LORAZEPAM 2 MG/ML IJ SOLN
1.0000 mg | Freq: Four times a day (QID) | INTRAMUSCULAR | Status: DC | PRN
  Administered 2020-07-22: 1 mg via INTRAVENOUS
  Filled 2020-07-21: qty 1

## 2020-07-21 MED ORDER — BACLOFEN 10 MG PO TABS
5.0000 mg | ORAL_TABLET | Freq: Three times a day (TID) | ORAL | Status: DC | PRN
  Filled 2020-07-21: qty 0.5

## 2020-07-21 MED ORDER — PREDNISONE 1 MG PO TABS
3.0000 mg | ORAL_TABLET | Freq: Every day | ORAL | Status: DC
  Administered 2020-07-22: 3 mg via ORAL
  Filled 2020-07-21: qty 3

## 2020-07-21 MED ORDER — GABAPENTIN 300 MG PO CAPS
300.0000 mg | ORAL_CAPSULE | Freq: Three times a day (TID) | ORAL | Status: DC
  Administered 2020-07-22: 300 mg via ORAL
  Filled 2020-07-21: qty 1

## 2020-07-21 MED ORDER — FOLIC ACID 1 MG PO TABS
1.0000 mg | ORAL_TABLET | Freq: Every day | ORAL | Status: DC
  Administered 2020-07-21 – 2020-07-22 (×2): 1 mg via ORAL
  Filled 2020-07-21 (×2): qty 1

## 2020-07-21 MED ORDER — OXYCODONE HCL 5 MG PO TABS
5.0000 mg | ORAL_TABLET | Freq: Four times a day (QID) | ORAL | Status: DC | PRN
  Administered 2020-07-21 – 2020-07-22 (×2): 5 mg via ORAL
  Filled 2020-07-21 (×2): qty 1

## 2020-07-21 MED ORDER — LIDOCAINE 4 % EX CREA
TOPICAL_CREAM | Freq: Two times a day (BID) | CUTANEOUS | Status: DC
  Filled 2020-07-21: qty 5

## 2020-07-21 MED ORDER — ZONISAMIDE 100 MG PO CAPS
200.0000 mg | ORAL_CAPSULE | Freq: Every day | ORAL | Status: DC
  Administered 2020-07-22: 200 mg via ORAL
  Filled 2020-07-21 (×2): qty 2

## 2020-07-21 NOTE — Consult Note (Addendum)
Neurology Consultation Reason for Consult: Bilateral lower extremity weakness Requesting Physician: Wynetta Fines  CC: Pain in the feet  History is obtained from: Patient and chart review   HPI: Miranda Padilla is a 55 y.o. right-handed woman with a past medical history significant for rheumatoid arthritis (prednisone monotherapy for now, methotrexate and Orencia on hold), degenerative lumbar spine disease with chronic back pain obesity (BMI 50.59), sickle cell trait and alpha thalassemia trait, iron deficiency anemia, reactive lymphadenopathy currently undergoing work-up, migraine headaches, depression/OCD  She first presented to the ED for evaluation on 03/07/2020.  Per notes she had been having some trouble with her feet and lower legs for a few months, particularly swelling as well as pain but this had escalated dramatically in the days prior to her presentation to the ED.  She reports to me that her presentation to the ED was the first time she had severe pain.  DVT was ruled out by lower extremity duplex and she was referred to follow-up with her PCP.  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  Her rheumatologist referred her to Dr. Rexene Alberts who saw her on 03/17/2020.  At that time neurological evaluation was notable for pain limited examination of the right foot as well as the right hip flexor.  Reflexes were 1+ in upper extremities, trace in the knees and difficult to test in the right ankle due to pain with the left ankle having trace reflexes as well, downgoing toes bilaterally.  She had patchy decrease in sensation and temperature and pinprick in the left foot and decreased sensation to all modalities in the right foot with allodynia.  With standing she had pain in the right lower back radiating to the right hip and right lateral foot.     Lumbar spine MRI was obtained with and without contrast which showed diffuse lymphadenopathy concerning for malignancy.  Orencia and methotrexate were stopped in March, and she had excisional lymph node biopsy on 07/01/2020 which fortunately was negative for malignancy and most consistent with a reactive lymphadenopathy.  Additionally she had an EMG/nerve conduction study on 03/23/2020 which showed and no significant polyneuropathy though it was challenging secondary to lower extremity edema and there was a possibility she had a pinched nerve in the right side of the back correlating with degenerative changes on her lumbar spine MRI, though neurosurgery does feel that her pain is out of proportion to the imaging findings.  The patient notes that she had been very stressed about the potential malignancy, however after she had a nerve block done for lymph node biopsy she had marked improvement in her upper back pain, was resting better, was exercising more and had improvement in her pain.  She also describes an episode where she happened to sneeze while she was bent over which seems to create a self adjustment in her spine and that also improved her pain.  Unfortunately on Tuesday night she had recurrence of severe right-sided sciatic pain that was refractory to baclofen (which she typically uses for severe headaches and has not used in 1 year prior to Tuesday night) as well as oxycodone (prescribed for post lymph node biopsy pain, which again she had not been using) she.  She also tried nonpharmacological interventions such as ice (which made the pain worse) heat (did not change the  pain), and massage.  Overall the pain was better with rest but she would still have some intermittent sharp pain as well.  Overall the quality of the pain is burning in nature although she will also have sharp pains typically with movement though sometimes seemingly out of nowhere.  Additionally she notes that the pain does very well  correlate with edema of the lower extremities, worsening when her edema is worse and improving when the edema improves.  She does feel that the gabapentin helped, and she does feel that stress exacerbates her pain  ROS: All other review of systems was negative except as noted in the HPI.   Past Medical History:  Diagnosis Date   Abdominal distension    Abdominal pain    Anemia    Anxiety    Arthritis    Asthma    Bronchitis    Constipation    Cough    Depression    Family history of adverse reaction to anesthesia    Pt mother would wake up during procedures   GERD (gastroesophageal reflux disease)    Headache(784.0)    Nausea & vomiting    Obsessive compulsive disorder    not on any medication at this time   Sickle cell trait ([place])    Thalassemia    Past Surgical History:  Procedure Laterality Date   AXILLARY LYMPH NODE BIOPSY Right 07/01/2020   Procedure: EXCISIONAL BIOPSY RIGHT AXILLARY LYMPH NODE;  Surgeon: Coralie Keens, MD;  Location: [facility];  Service: General;  Laterality: Right;   CHOLECYSTECTOMY  02/03/2011   Procedure: LAPAROSCOPIC CHOLECYSTECTOMY;  Surgeon: Harl Bowie, MD;  Location: [facility];  Service: General;  Laterality: N/A;  Laparoscopic Choleystectomy   COLONOSCOPY WITH PROPOFOL N/A 06/01/2016   Procedure: COLONOSCOPY WITH PROPOFOL;  Surgeon: Ronnette Juniper, MD;  Location: [facility];  Service: Gastroenterology;  Laterality: N/A;   MOUTH SURGERY  2000   Current Outpatient Medications  Medication Instructions   acetaminophen (TYLENOL) 1,000 mg, Oral, Every 6 hours PRN   Baclofen 5 mg, Oral, 3 times daily PRN   ergocalciferol (VITAMIN D2) 1.25 MG (50000 UT) capsule 1 capsule   folic acid (FOLVITE) 1 mg, Oral, Daily   gabapentin (NEURONTIN) 300 mg, Oral, 3 times daily   oxyCODONE (OXY IR/ROXICODONE) 5 mg, Oral, Every 6 hours PRN   predniSONE (DELTASONE) 3 mg, Oral, Daily with breakfast   zonisamide (ZONEGRAN) 200 mg, Oral, Daily     Family History  Problem Relation Age of Onset   Thrombocytopenia Mother    Anesthesia problems Mother    Chronic Renal Failure Mother    Cancer Maternal Grandmother        breast    Social History:  reports that she has never smoked. She has never used smokeless tobacco. She reports that she does not drink alcohol and does not use drugs.   Exam: Current vital signs: BP 121/75 (BP Location: Right Arm)   Pulse (!) 112   Temp 97.7 F (36.5 C) (Oral)   Resp 18   Ht $R'5\' 5"'fP$  (1.651 m)   Wt (!) 137.9 kg   LMP 03/24/2016 (Exact Date)   SpO2 99%   BMI 50.59 kg/m  Vital signs in last 24 hours: Temp:  [97.7 F (36.5 C)-97.8 F (36.6 C)] 97.7 F (36.5 C) (06/29 0844) Pulse Rate:  [84-112] 112 (06/29 1406) Resp:  [16-20] 18 (06/29 1406) BP: (110-143)/(74-90) 121/75 (06/29 1406) SpO2:  [95 %-100 %] 99 % (06/29 1406) Weight:  [  137.9 kg] 137.9 kg (06/29 0140)   Physical Exam  Constitutional: Appears well-developed and well-nourished.  Tense with any voluntary movements Psych: Affect appropriate to situation, intermittently tearful about her pain but pleasant and cooperative Eyes: No scleral injection HENT: No oropharyngeal obstruction.  MSK: no joint deformities.  Cardiovascular: Normal rate and regular rhythm.  Respiratory: Effort normal, non-labored breathing GI: Soft.  No distension. There is no tenderness.  Skin: Warm dry and intact visible skin  Neuro: Mental Status: Patient is awake, alert, oriented to person, place, month, year, and situation. Patient is able to give a clear and coherent history. No signs of aphasia or neglect Cranial Nerves: II: Visual Fields are full. Pupils are equal, round, and reactive to light.   III,IV, VI: EOMI without ptosis, though she notes some mild blurriness of her vision on right gaze V: Facial sensation is symmetric to temperature and light touch VII: Facial movement is symmetric.  VIII: hearing is intact to voice X: Uvula  elevates symmetrically XI: Shoulder shrug is symmetric. XII: tongue is midline without atrophy or fasciculations.  Motor: Tone is normal. Bulk is normal. 5/5 strength was present throughout the upper extremities.  The lower extremities are extremely pain limited.  For example she does not lift her legs even antigravity on hip flexor testing, and Hoover's sign is positive.  She is able to move from laying down to sitting at the edge of the bed and is able to give at least 3/5 effort bilaterally with hip flexors, 5/5 knee flexion and extension and is able to give at least 3/5 with bilateral feet dorsi and plantar flexion.  Additionally she stands up from the bed without support, with minimal use of her upper extremities for stabilization. Sensory: Sensation is intact to light touch and temperature as well as pinprick and vibration in the upper extremities.  In the lower extremities on the lateral aspect of both feet she has diminished pinprick sensation.  She has a length dependent loss of pinprick and cold temperature sensation in both lower extremities as well, with loss of temperature more on the lateral aspects of the feet than in the medial aspects.  Vibration is intact for 9 seconds at the great toes and proprioception is intact at the great toes as well, but diminished in the pinky toes.  She has significant allodynia in the bilateral feet Deep Tendon Reflexes: Reflexes are 2+ in the bilateral biceps, triceps, 3+ in the right brachioradialis and 2+ in the left brachioradialis, 2+ in the bilateral patellae, trace in the ankles bilaterally Plantars: Toes are mute bilaterally but it is unclear if the patient is truly able to relax and due to her anticipation of pain Cerebellar: Finger-nose is intact bilaterally, heel-to-shin is pain limited  I have reviewed labs in epic and the results pertinent to this consultation are:  BMP notable for mild hypocalcemia at 8.8, mild hyperglycemia 112 CBC notable  for stable leukopenia at 3.8, mild microcytic anemia (hemoglobin 11.1, MCV 79.2, with increased RDW at 16)  07/14/2020 CMP notable for albumin of 3.8 and total protein of 7.6 with a borderline elevated gamma gap at 3.8  03/17/2020  B12 level 596, B6 8.2, CK 78, LDH 187, RPR nonreactive   No results found for: HGBA1C   11/12/2019 outside record results per Dr. Rexene Alberts: WBC was 4.6, hematocrit 35.2, MCV 81.5, slightly below normal, glucose 119, chloride 108 which is slightly above normal, carbon dioxide was 19. AST was 12, ALT was 14, ESR was 44.  I have reviewed the images obtained: MRI thoracic spine without contrast is significantly motion limited but no clear pathology MRI lumbar spine demonstrates stable lymphadenopathy, increased left subarticular recess and foraminal protrusion at L3-4 without clear nerve root compression  Visit Date:                     03/23/2020 07:23 Age:                               44 Years Examining Physician:  Arlice Colt, MD Referring Physician:    Star Age, MD    History: Ms. Marchia Meiers is a 55 year old woman with right greater than left leg weakness and numbness.  The symptoms have progressed over 6 months.  Additionally, she has severe swelling in both legs that has worsened over the past couple months.  On examination, she had altered sensation in the right lateral foot more than the dorsum of the foot.  She also reported numbness in the second and third toes on the left and in both shins in a nondermatome or neurotome distribution.  Strength was 4+/5 in toe and ankle extension on the right.   Nerve conduction studies: Bilateral peroneal and tibial motor responses had normal distal latencies, amplitudes and conduction velocities.  The tibial F-wave latencies were normal.  Sensory NCV's were difficult due to swelling in the foot.  In this context, the right sural sensory response had normal peak latency and amplitude.  The left sural and left  superficial peroneal sensory responses had normal peak latencies with reduced amplitude in the right superficial peroneal sensory response was absent.   Electromyography: Needle EMG of selected muscles of both legs was performed.  In the right leg, there was mild chronic/acute denervation in the gastrocnemius muscle and mild chronic denervation in the gluteus medius muscle.  The foot muscles showed some polyphasic motor units though the recruitment pattern was normal.  In the left leg, several muscles had a small number of polyphasic motor units but recruitment was normal.   Impression: This NCV/EMG study shows the following: 1.   There was no evidence of any significant polyneuropathy though a minimal axonal sensory polyneuropathy cannot be completely excluded..  The mild reduced amplitude noted in 2 of the sensory responses and another absent sensory response were more likely related to the extent of edema in the feet. 2.    Possible right S1 radiculopathy.  Impression: 55 year old medically complex woman as above, presenting with burning pain in the feet most consistent with a small fiber neuropathy additionally with component of sciatic pain on a background of chronic pain and rheumatoid arthritis which have likely been exacerbated by holding her disease modifying treatments in the setting of concern for lymphadenopathy recently found to be a reactive process.  Her exam is additionally notable for increased reflexes compared to prior which may be benign (related to adrenergic state from being in the hospital/stressed), but compressive C-spine process should be ruled out given her history of degenerative disc disease.  Most of the prior work-up appears to focus on large fiber studies, will additionally add some small fiber neuropathy work-up as below.  Initially she was thought to have absent reflexes on ED provider examination with curbside recommendation for lumbar puncture.  However given the fact  that she does have reflexes on my examination as well as the history that she provides, I do not think that this is  consistent with GBS and will cancel LP order.  Recommendations: -MRI cervical spine given reflexes have increased compared to prior -Increase gabapentin, will start by adding an additional 600 in the evening (that she should be taking 300 in the morning, 300 midday and a total of 900 milligrams at night)  -Can be increased gradually until symptom control or max of 1200 mg 3 times daily as long as her renal function remained stable -Lidocaine cream applied 30 minutes prior to capsaicin cream twice daily -Additional work-up of small fiber neuropathy to include A1c, TSH, SPEP, UPEP, IFE, HIV, HepB/C, GM1, anti-Mag, to be followed up by outpatient neurology, rheumatology and PCP -Neurology will follow up MRI cervical spine otherwise we will be available on an as-needed basis going forward.  Please re-page if new questions arise  Lesleigh Noe MD-PhD [facility] [phone] Available 7 PM to 7 AM, outside of these hours please call Neurologist on call as listed on Amion.  Addendum: MRI cervical spine personally reviewed, negative for acute process

## 2020-07-21 NOTE — ED Notes (Signed)
Spoke with Debbie in MRI at [facility] regarding transfer to ED for MRI

## 2020-07-21 NOTE — ED Notes (Signed)
Attempted to assist pt to ambulate to the bathroom. Pt appears to not be able to move left leg appropriately. Pt able to bear her weight on her legs but unable to move her legs effectively. MD notified and witnessed pt's gait

## 2020-07-21 NOTE — H&P (Signed)
History and Physical    Ardeth Repetto QQI:[id number] DOB: 1965-11-17 DOA: 07/21/2020  PCP: Antony Contras, MD (Confirm with patient/family/NH records and if not entered, this has to be entered at [facility] point of entry) Patient coming from: Home  I have personally briefly reviewed patient's old medical records in [place]  Chief Complaint: Worsening of leg pain and pain  HPI: Warda Hall-Rodriguez is a 55 y.o. female with medical history significant of chronic lumbar radiculopathy, morbid obesity, GERD, presented with worsening of leg and feet weakness and pain.  Patient has had chronic lumbar radiculopathy related to bilateral ankle and feet, left side> right side.  Symptoms involve tingling sensation on the lateral side of the feet and first and second toe pain and weakness of bilateral ankle when ambulating, for which he has to use a cane to ambulate.  She was started with gabapentin recently with minimal improvement.  4 days ago, patient started to experience extreme pain and weakness of bilateral legs to the level of bilateral knees.  Denied any sensation changes on saddle area no change of urine or bowel movement.  No fever chills.  ED Course: MRI of lumbar spine, mild progression of a left subarticular recess and foraminal protrusion at L3-4 causing mild narrowing in the subarticular recess and foramen. No nerve root compression is identified. No change in multilevel facet arthropathy.  Neurology consulted, recommend lumbar puncture to rule out GBS.  Review of Systems: As per HPI otherwise 14 point review of systems negative.    Past Medical History:  Diagnosis Date   Abdominal distension    Abdominal pain    Anemia    Anxiety    Arthritis    Asthma    Bronchitis    Constipation    Cough    Depression    Family history of adverse reaction to anesthesia    Pt mother would wake up during procedures   GERD (gastroesophageal reflux disease)    Headache(784.0)    Nausea  & vomiting    Obsessive compulsive disorder    not on any medication at this time   Sickle cell trait ([place])    Thalassemia     Past Surgical History:  Procedure Laterality Date   AXILLARY LYMPH NODE BIOPSY Right 07/01/2020   Procedure: EXCISIONAL BIOPSY RIGHT AXILLARY LYMPH NODE;  Surgeon: Coralie Keens, MD;  Location: [place];  Service: General;  Laterality: Right;   CHOLECYSTECTOMY  02/03/2011   Procedure: LAPAROSCOPIC CHOLECYSTECTOMY;  Surgeon: Harl Bowie, MD;  Location: [place];  Service: General;  Laterality: N/A;  Laparoscopic Choleystectomy   COLONOSCOPY WITH PROPOFOL N/A 06/01/2016   Procedure: COLONOSCOPY WITH PROPOFOL;  Surgeon: Ronnette Juniper, MD;  Location: [place];  Service: Gastroenterology;  Laterality: N/A;   MOUTH SURGERY  2000     reports that she has never smoked. She has never used smokeless tobacco. She reports that she does not drink alcohol and does not use drugs.  Allergies  Allergen Reactions   Other Anaphylaxis and Other (See Comments)    Covid 19 vaccine (brand?)   Sulfonamide Derivatives Itching and Rash   Sulfa Antibiotics Itching and Rash    Family History  Problem Relation Age of Onset   Thrombocytopenia Mother    Anesthesia problems Mother    Chronic Renal Failure Mother    Cancer Maternal Grandmother        breast     Prior to Admission medications   Medication Sig Start Date End  Date Taking? Authorizing Provider  acetaminophen (TYLENOL) 500 MG tablet Take 1,000 mg by mouth every 6 (six) hours as needed for mild pain or moderate pain.   Yes [provider]  Baclofen 5 MG TABS Take 5 mg by mouth 3 (three) times daily as needed (for muscle spasms).   Yes [provider]  folic acid (FOLVITE) 1 MG tablet Take 1 mg by mouth daily.   Yes [provider]  gabapentin (NEURONTIN) 300 MG capsule Take 300 mg by mouth 3 (three) times daily.   Yes [provider]  oxyCODONE (OXY  IR/ROXICODONE) 5 MG immediate release tablet Take 5 mg by mouth every 6 (six) hours as needed for severe pain or moderate pain.   Yes [provider]  predniSONE (DELTASONE) 1 MG tablet Take 3 mg by mouth daily with breakfast. 08/19/19  Yes [provider]  zonisamide (ZONEGRAN) 100 MG capsule Take 200 mg by mouth daily.   Yes [provider]  ergocalciferol (VITAMIN D2) 1.25 MG (50000 UT) capsule 1 capsule Patient not taking: No sig reported 01/09/19   [provider]    Physical Exam: Vitals:   07/21/20 0626 07/21/20 0715 07/21/20 0844 07/21/20 1406  BP:  126/74 110/78 121/75  Pulse:  86 84 (!) 112  Resp: 20 18 16 18   Temp:   97.7 F (36.5 C)   TempSrc:   Oral   SpO2:  95% 100% 99%  Weight:      Height:        Constitutional: NAD, calm, comfortable Vitals:   07/21/20 0626 07/21/20 0715 07/21/20 0844 07/21/20 1406  BP:  126/74 110/78 121/75  Pulse:  86 84 (!) 112  Resp: 20 18 16 18   Temp:   97.7 F (36.5 C)   TempSrc:   Oral   SpO2:  95% 100% 99%  Weight:      Height:       Eyes: PERRL, lids and conjunctivae normal ENMT: Mucous membranes are moist. Posterior pharynx clear of any exudate or lesions.Normal dentition.  Neck: normal, supple, no masses, no thyromegaly Respiratory: clear to auscultation bilaterally, no wheezing, no crackles. Normal respiratory effort. No accessory muscle use.  Cardiovascular: Regular rate and rhythm, no murmurs / rubs / gallops. No extremity edema. 2+ pedal pulses. No carotid bruits.  Abdomen: no tenderness, no masses palpated. No hepatosplenomegaly. Bowel sounds positive.  Musculoskeletal: no clubbing / cyanosis. No joint deformity upper and lower extremities. Good ROM, no contractures. Normal muscle tone.  Skin: no rashes, lesions, ulcers. No induration Neurologic: CN 2-12 grossly intact. Sensation intact, DTR normal.  Weakness of bilateral dorsal flexion angles and decreased light touch sensation on the  bilateral feet more towards the lateral sides Psychiatric: Normal judgment and insight. Alert and oriented x 3. Normal mood.     Labs on Admission: I have personally reviewed following labs and imaging studies  CBC: Recent Labs  Lab 07/21/20 0610  WBC 3.8*  NEUTROABS 2.2  HGB 11.1*  HCT 35.0*  MCV 79.2*  PLT 703   Basic Metabolic Panel: Recent Labs  Lab 07/21/20 0610  NA 138  K 4.1  CL 106  CO2 23  GLUCOSE 112*  BUN 18  CREATININE 0.72  CALCIUM 8.8*   GFR: Estimated Creatinine Clearance: 112.1 mL/min (by C-G formula based on SCr of 0.72 mg/dL). Liver Function Tests: No results for input(s): AST, ALT, ALKPHOS, BILITOT, PROT, ALBUMIN in the last 168 hours. No results for input(s): LIPASE, AMYLASE in the  last 168 hours. No results for input(s): AMMONIA in the last 168 hours. Coagulation Profile: No results for input(s): INR, PROTIME in the last 168 hours. Cardiac Enzymes: No results for input(s): CKTOTAL, CKMB, CKMBINDEX, TROPONINI in the last 168 hours. BNP (last 3 results) No results for input(s): PROBNP in the last 8760 hours. HbA1C: No results for input(s): HGBA1C in the last 72 hours. CBG: No results for input(s): GLUCAP in the last 168 hours. Lipid Profile: No results for input(s): CHOL, HDL, LDLCALC, TRIG, CHOLHDL, LDLDIRECT in the last 72 hours. Thyroid Function Tests: No results for input(s): TSH, T4TOTAL, FREET4, T3FREE, THYROIDAB in the last 72 hours. Anemia Panel: No results for input(s): VITAMINB12, FOLATE, FERRITIN, TIBC, IRON, RETICCTPCT in the last 72 hours. Urine analysis:    Component Value Date/Time   COLORURINE YELLOW 01/07/2011 0745   APPEARANCEUR CLEAR 01/07/2011 0745   LABSPEC 1.011 01/07/2011 0745   PHURINE 6.5 01/07/2011 0745   GLUCOSEU NEGATIVE 01/07/2011 0745   HGBUR MODERATE (A) 01/07/2011 0745   BILIRUBINUR NEGATIVE 01/07/2011 0745   KETONESUR NEGATIVE 01/07/2011 0745   PROTEINUR NEGATIVE 01/07/2011 0745   UROBILINOGEN 0.2  01/07/2011 0745   NITRITE NEGATIVE 01/07/2011 0745   LEUKOCYTESUR NEGATIVE 01/07/2011 0745    Radiological Exams on Admission: MR THORACIC SPINE WO CONTRAST  Result Date: 07/21/2020 CLINICAL DATA:  Numbness in both feet and difficulty walking. Back pain radiating into both legs. Increasing lower extremity weakness. EXAM: MRI THORACIC SPINE WITHOUT CONTRAST TECHNIQUE: Multiplanar, multisequence MR imaging of the thoracic spine was performed. No intravenous contrast was administered. COMPARISON:  None. FINDINGS: Alignment:  Normal. Vertebrae: No fracture, evidence of discitis, or bone lesion. Cord: Although patient motion limits evaluation, no cord signal abnormality is identified. Paraspinal and other soft tissues: Negative. Disc levels: Shallow central protrusions are seen at T4-5, T5-6, T6-7 and T7-8. There is a minimal disc bulge at T8-9. No stenosis is identified. IMPRESSION: Limited examination due to patient motion but no cord signal abnormality is identified. Mild thoracic degenerative change without stenosis. Electronically Signed   By: Inge Rise M.D.   On: 07/21/2020 13:58   MR LUMBAR SPINE WO CONTRAST  Result Date: 07/21/2020 CLINICAL DATA:  Increasing low back pain radiating into both legs. No known injury. EXAM: MRI LUMBAR SPINE WITHOUT CONTRAST TECHNIQUE: Multiplanar, multisequence MR imaging of the lumbar spine was performed. No intravenous contrast was administered. COMPARISON:  MRI lumbar spine with and without contrast 03/17/2020. CT chest, abdomen and pelvis 05/29/2020. FINDINGS: Segmentation:  Standard. Alignment:  Normal. Vertebrae:  No fracture, evidence of discitis, or bone lesion. Conus medullaris and cauda equina: Conus extends to the L1-2 level. Conus and cauda equina appear normal. Paraspinal and other soft tissues: Again seen is retroperitoneal lymphadenopathy which is not notably changed compared to the prior CT. Disc levels: L1-2: Negative. L2-3: Negative. L3-4:  Bilateral facet degenerative disease with associated effusions, greater on the right. There is ligamentum flavum thickening and a disc bulge. A superimposed left subarticular recess and foraminal protrusion appears slightly increased in size compared to the prior exam. There is mild narrowing in the left subarticular recess and foramen. Mild central canal stenosis is present. The right foramen is open. L4-5: Shallow disc bulge and bilateral facet degenerative disease. There is some ligamentum flavum thickening. No stenosis or change. L5-S1: Negative for disc bulge or protrusion. Bilateral facet degenerative change. No stenosis. IMPRESSION: Mild progression of a left subarticular recess and foraminal protrusion at L3-4 causing mild narrowing in the subarticular recess and foramen.  No nerve root compression is identified. No change in multilevel facet arthropathy. Electronically Signed   By: Inge Rise M.D.   On: 07/21/2020 13:54    EKG: Independently reviewed. Ordered.  Assessment/Plan Active Problems:   Impaired ambulation  (please populate well all problems here in Problem List. (For example, if patient is on BP meds at home and you resume or decide to hold them, it is a problem that needs to be her. Same for CAD, COPD, HLD and so on)  Acute on chronic ambulation dysfunction -Symptoms and signs correlated with worsening of lumbar radiculopathy especially L4-L5, L5-S1.  However given the nature of ascending weakness, neurology ordered lumbar puncture to rule out GBS. -Seems to gabapentin not helpful, will start trial of duloxetine, check EKG to see Qtc. -PT evaluation, expect less than 2 midnight hospital stay, may need outpatient PT versus home PT.  Morbid obesity -Recommend calorie control  DVT prophylaxis: SCD Code Status: Full code Family Communication: Husband at bedside Disposition Plan: Expect less than 2 midnight hospital stay Consults called: Neurology Admission status: MedSurg  Obs   Lequita Halt MD [facility] Pager [phone]  07/21/2020, 7:01 PM

## 2020-07-21 NOTE — ED Provider Notes (Signed)
Patient seen after prior ED provider.  Patient is presenting with complaints of low back pain and worsening pain to the lower extremities and associated weakness.  Patient with unstable gait.  Neuro consult is pending.  Neuro requested LP.  Patient is not interested in blind attempts for LP.  Case discussed with radiology.  Radiology agrees that given the patient's body habitus blind LP would be technically difficult if not impossible (6 inch needle is likely required).  Patient is on the schedule for a fluoroscopy guided LP early tomorrow morning.  She should be the first case per radiology.  Neurology is aware of difficulties involved in obtaining LP tonight.  Patient will be admitted.  Hospitalist services aware of case and will evaluate for admission.   Valarie Merino, MD 07/21/20 [phone]

## 2020-07-21 NOTE — ED Notes (Signed)
Patient transported to MRI 

## 2020-07-21 NOTE — ED Triage Notes (Signed)
Pt with herniated disc with pinched nerves around L4-L5. Pt is being followed by spinal surgeon. This past week pt started having numbness in bilateral feet. Today pt with pain that radiates down her R leg. Pt states she took APAP, ibuprofen, a muscle relaxer, and an oxycodone without relief. Pt denies urinary incontinence.

## 2020-07-21 NOTE — ED Notes (Signed)
Report given to [place] with [facility]

## 2020-07-21 NOTE — ED Triage Notes (Signed)
Pt come from [facility] needing MRI of back due to worsening pain and tingling.

## 2020-07-21 NOTE — ED Provider Notes (Addendum)
Care of the patient assumed on arrival from [facility] for MRI. History of low back pain, herniated disk. Also had lymphadenopathy and reports recently told it was not cancer. Now with numbness in feet and weak plantar flexion.  Physical Exam  BP 110/78 (BP Location: Right Arm)   Pulse 84   Temp 97.7 F (36.5 C) (Oral)   Resp 16   Ht 5\' 5"  (1.651 m)   Wt (!) 137.9 kg   LMP 03/24/2016 (Exact Date)   SpO2 100%   BMI 50.59 kg/m   Physical Exam  ED Course/Procedures     Procedures  MDM  MRI results not yet in Epic but from PACS:  L spine: IMPRESSION: Mild progression of a left subarticular recess and foraminal protrusion at L3-4 causing mild narrowing in the subarticular recess and foramen. No nerve root compression is identified.  No change in multilevel facet arthropathy.  T spine: MPRESSION: Limited examination due to patient motion but no cord signal abnormality is identified.  Mild thoracic degenerative change without stenosis.  Patient given additional pain medications and will attempt ambulation. She was advised she may need to see Neurology as an outpatient. Already has Rheum appointment later this week.   Patient unable to walk. She can stand and bear weight but having incredible difficulty getting her leg moving forward. Will discuss with Neurology.   Spoke with Dr. Rory Percy, neurology, who requests LP to evaluate protein, GBS. Care of the patient will be signed out to Dr. Francia Greaves at the change of shift.    Truddie Hidden, MD 07/21/20 [phone]

## 2020-07-21 NOTE — ED Provider Notes (Signed)
[place] EMERGENCY DEPARTMENT Provider Note   CSN: [id number] Arrival date & time: 07/21/20  0124     History Chief Complaint  Patient presents with   Back Pain    Miranda Padilla is a 55 y.o. female.  The history is provided by the patient.  Back Pain Location:  Lumbar spine Quality:  Aching Pain severity:  Severe Onset quality:  Gradual Duration:  4 days Timing:  Constant Progression:  Worsening Chronicity:  New Context: not falling   Relieved by:  Nothing Worsened by:  Ambulation Associated symptoms: numbness, tingling and weakness   Associated symptoms: no abdominal pain, no bladder incontinence, no bowel incontinence, no fever and no perianal numbness   Patient with extensive history including obesity, iron deficiency anemia presents with back pain.  Patient reports over the past 4 days she has had gradually worsening bilateral low back pain that radiates into her legs.  No recent falls or trauma.  No previous back surgery.  She reports she is now having numbness in her feet and difficulty walking.  She reports her legs are now feeling weak over the past several days.  No urinary or fecal incontinence is reported. She does take steroids daily for rheumatoid disease  Patient reports a recent work-up for cancer due to diffuse lymphadenopathy.  After lymph node biopsies she was told she does not have cancer    Past Medical History:  Diagnosis Date   Abdominal distension    Abdominal pain    Anemia    Anxiety    Arthritis    Asthma    Bronchitis    Constipation    Cough    Depression    Family history of adverse reaction to anesthesia    Pt mother would wake up during procedures   GERD (gastroesophageal reflux disease)    Headache(784.0)    Nausea & vomiting    Obsessive compulsive disorder    not on any medication at this time   Sickle cell trait ([place])    Thalassemia     Patient Active Problem List   Diagnosis Date Noted   IDA (iron  deficiency anemia) 05/24/2020   Symptomatic cholelithiasis 01/31/2011   ALLERGIC RHINITIS 07/02/2008   HEADACHE, CHRONIC 07/02/2008   DYSPNEA 07/02/2008   COUGH 07/02/2008   OBESITY 07/01/2008   SICKLE CELL TRAIT 07/01/2008   OBSESSIVE-COMPULSIVE DISORDER 07/01/2008   DEPRESSION 07/01/2008    Past Surgical History:  Procedure Laterality Date   AXILLARY LYMPH NODE BIOPSY Right 07/01/2020   Procedure: EXCISIONAL BIOPSY RIGHT AXILLARY LYMPH NODE;  Surgeon: Coralie Keens, MD;  Location: [place];  Service: General;  Laterality: Right;   CHOLECYSTECTOMY  02/03/2011   Procedure: LAPAROSCOPIC CHOLECYSTECTOMY;  Surgeon: Harl Bowie, MD;  Location: [place];  Service: General;  Laterality: N/A;  Laparoscopic Choleystectomy   COLONOSCOPY WITH PROPOFOL N/A 06/01/2016   Procedure: COLONOSCOPY WITH PROPOFOL;  Surgeon: Ronnette Juniper, MD;  Location: [place];  Service: Gastroenterology;  Laterality: N/A;   MOUTH SURGERY  2000     OB History   No obstetric history on file.     Family History  Problem Relation Age of Onset   Thrombocytopenia Mother    Anesthesia problems Mother    Chronic Renal Failure Mother    Cancer Maternal Grandmother        breast    Social History   Tobacco Use   Smoking status: Never   Smokeless tobacco: Never  Vaping Use   Vaping  Use: Never used  Substance Use Topics   Alcohol use: No   Drug use: No    Home Medications Prior to Admission medications   Medication Sig Start Date End Date Taking? Authorizing Provider  ergocalciferol (VITAMIN D2) 1.25 MG (50000 UT) capsule 1 capsule 01/09/19   [provider]  folic acid (FOLVITE) 1 MG tablet Take 1 tablet by mouth daily.    [provider]  gabapentin (NEURONTIN) 300 MG capsule Take 300 mg by mouth 3 (three) times daily.    [provider]  predniSONE (DELTASONE) 5 MG tablet 1tablets 02/17/19   [provider]  zonisamide (ZONEGRAN) 100 MG capsule  100 mg 2 (two) times daily.    [provider]    Allergies    Other and Sulfonamide derivatives  Review of Systems   Review of Systems  Constitutional:  Negative for fever.  Gastrointestinal:  Negative for abdominal pain and bowel incontinence.  Genitourinary:  Negative for bladder incontinence.  Musculoskeletal:  Positive for back pain.  Neurological:  Positive for tingling, weakness and numbness.  All other systems reviewed and are negative.  Physical Exam Updated Vital Signs BP 120/84   Pulse 85   Temp 97.8 F (36.6 C) (Oral)   Resp 20   Ht 1.651 m (5\' 5" )   Wt (!) 137.9 kg   LMP 03/24/2016 (Exact Date)   SpO2 100%   BMI 50.59 kg/m   Physical Exam CONSTITUTIONAL: Chronically ill-appearing HEAD: Normocephalic/atraumatic EYES: EOMI/PERRL ENMT: Mucous membranes moist NECK: supple no meningeal signs SPINE/BACK:entire spine nontender no bruising/crepitance/stepoffs noted to spine Diffuse paraspinal tenderness mostly in the lumbar region No erythema, no crepitus CV: S1/S2 noted, no murmurs/rubs/gallops noted LUNGS: Lungs are clear to auscultation bilaterally, no apparent distress ABDOMEN: soft, nontender, no rebound or guarding, obese GU:no cva tenderness NEURO: Awake/alert, patient has difficulty with hip flexion bilaterally.  She is not able to flex her knees.  She is also having weakness with bilateral plantar flexion.  She reports subjective numbness throughout both feet  no clonus bilaterally, unable to elicit Achilles and patellar reflexes  EXTREMITIES: pulses normal, full ROM, no deformities, distal pulses equal and intact.  Patient has chronic pitting edema to bilateral lower extremities SKIN: warm, color normal PSYCH: no abnormalities of mood noted, alert and oriented to situation  ED Results / Procedures / Treatments   Labs (all labs ordered are listed, but only abnormal results are displayed) Labs Reviewed  CBC WITH DIFFERENTIAL/PLATELET -  Abnormal; Notable for the following components:      Result Value   WBC 3.8 (*)    Hemoglobin 11.1 (*)    HCT 35.0 (*)    MCV 79.2 (*)    MCH 25.1 (*)    RDW 16.0 (*)    All other components within normal limits  BASIC METABOLIC PANEL - Abnormal; Notable for the following components:   Glucose, Bld 112 (*)    Calcium 8.8 (*)    All other components within normal limits  RESP PANEL BY RT-PCR (FLU A&B, COVID) ARPGX2  URINALYSIS, ROUTINE W REFLEX MICROSCOPIC    EKG None  Radiology No results found.  Procedures Procedures   Medications Ordered in ED Medications  LORazepam (ATIVAN) injection 1 mg (has no administration in time range)  HYDROmorphone (DILAUDID) injection 1 mg (1 mg Intravenous Given 07/21/20 0618)  ondansetron (ZOFRAN) injection 4 mg (4 mg Intravenous Given 07/21/20 5956)    ED Course  I have reviewed the triage vital signs and  the nursing notes.  Pertinent labs & imaging results that were available during my care of the patient were reviewed by me and considered in my medical decision making (see chart for details).    MDM Rules/Calculators/A&P                          6:19 AM Patient was recently found to have diffuse lymphadenopathy that was found to be reactive, she had a negative biopsy Patient had MRI lumbar spine February of this year that revealed lymphadenopathy, disc herniation at L3 on the left and osteoarthritis.  Patient is now having severe back pain with paresthesias in both legs as well as focal weakness I feel patient will require emergent MRI. Plan is to transfer patient to [facility] emergency department for MRI which will include thoracic and lumbar spine. Accepted by Dr. Wyvonnia Dusky 7:31 AM Patient reports pain improved, but still has weakness in her legs.  It has not progressed.  She will be getting transferred soon to [facility]  Final Clinical Impression(s) / ED Diagnoses Final diagnoses:  Lumbosacral radiculopathy  Weakness of both  lower extremities    Rx / DC Orders ED Discharge Orders     None        Ripley Fraise, MD 07/21/20 [phone]

## 2020-07-22 ENCOUNTER — Observation Stay (HOSPITAL_COMMUNITY): Payer: 59

## 2020-07-22 DIAGNOSIS — R262 Difficulty in walking, not elsewhere classified: Secondary | ICD-10-CM

## 2020-07-22 DIAGNOSIS — R29898 Other symptoms and signs involving the musculoskeletal system: Secondary | ICD-10-CM

## 2020-07-22 LAB — HEPATITIS C ANTIBODY: HCV Ab: NONREACTIVE

## 2020-07-22 LAB — BASIC METABOLIC PANEL
Anion gap: 7 (ref 5–15)
BUN: 11 mg/dL (ref 6–20)
CO2: 24 mmol/L (ref 22–32)
Calcium: 8.6 mg/dL — ABNORMAL LOW (ref 8.9–10.3)
Chloride: 107 mmol/L (ref 98–111)
Creatinine, Ser: 0.74 mg/dL (ref 0.44–1.00)
GFR, Estimated: >60 mL/min (ref >60)
Glucose, Bld: 99 mg/dL (ref 70–99)
Potassium: 3.6 mmol/L (ref 3.5–5.1)
Sodium: 138 mmol/L (ref 135–145)

## 2020-07-22 LAB — URINALYSIS, ROUTINE W REFLEX MICROSCOPIC
Bilirubin Urine: NEGATIVE
Glucose, UA: NEGATIVE mg/dL
Hgb urine dipstick: NEGATIVE
Ketones, ur: 5 mg/dL — AB
Leukocytes,Ua: NEGATIVE
Nitrite: NEGATIVE
Protein, ur: NEGATIVE mg/dL
Specific Gravity, Urine: 1.014 (ref 1.005–1.030)
pH: 5 (ref 5.0–8.0)

## 2020-07-22 LAB — CBC
HCT: 34.1 % — ABNORMAL LOW (ref 36.0–46.0)
Hemoglobin: 10.7 g/dL — ABNORMAL LOW (ref 12.0–15.0)
MCH: 24.9 pg — ABNORMAL LOW (ref 26.0–34.0)
MCHC: 31.4 g/dL (ref 30.0–36.0)
MCV: 79.3 fL — ABNORMAL LOW (ref 80.0–100.0)
Platelets: 287 10*3/uL (ref 150–400)
RBC: 4.3 MIL/uL (ref 3.87–5.11)
RDW: 15.9 % — ABNORMAL HIGH (ref 11.5–15.5)
WBC: 3.1 10*3/uL — ABNORMAL LOW (ref 4.0–10.5)
nRBC: 0 % (ref 0.0–0.2)

## 2020-07-22 LAB — HIV ANTIBODY (ROUTINE TESTING W REFLEX): HIV Screen 4th Generation wRfx: NONREACTIVE

## 2020-07-22 LAB — HEMOGLOBIN A1C
Hgb A1c MFr Bld: 5.5 % (ref 4.8–5.6)
Mean Plasma Glucose: 111 mg/dL

## 2020-07-22 LAB — TSH: TSH: 0.773 u[IU]/mL (ref 0.350–4.500)

## 2020-07-22 LAB — HEPATITIS B SURFACE ANTIGEN: Hepatitis B Surface Ag: NONREACTIVE

## 2020-07-22 IMAGING — MR MR CERVICAL SPINE W/O CM
5 series · 38 of 48 positions shown · non-contrast
Comparison: None.

CLINICAL DATA: Progressive myelopathy

EXAM:
MRI CERVICAL SPINE WITHOUT CONTRAST
TECHNIQUE: Multiplanar, multisequence MR imaging of the cervical spine was
performed. No intravenous contrast was administered.

[Series 5: T2 · sagittal · 3.0mm · 0.69mm/px · 6 of 15 slices shown (1 of 2)]
[im 1/15]
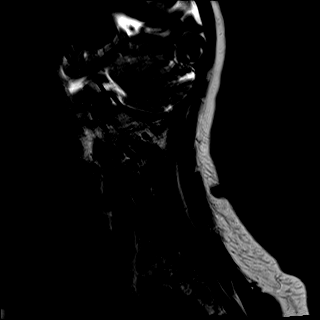
[im 3/15]
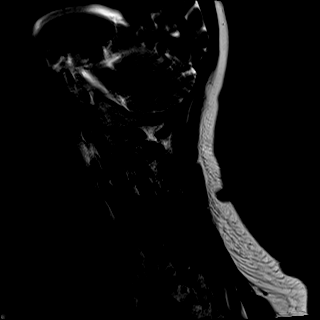
[im 6/15]
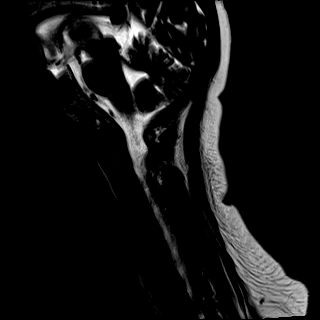
[im 9/15]
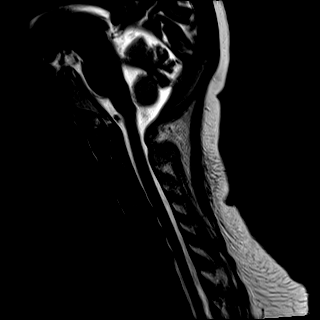
[im 12/15]
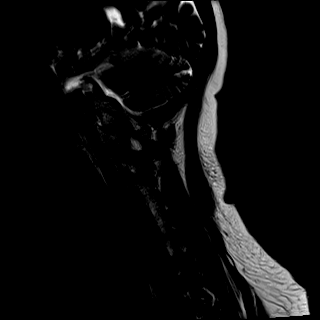
[im 15/15]
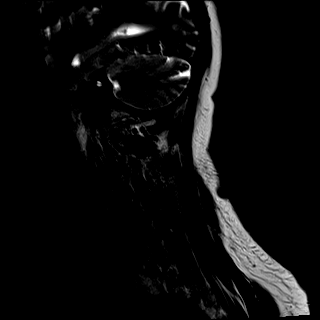

[Series 6: T1 · sagittal · 3.0mm · 0.69mm/px · 6 of 15 slices shown]
[im 1/15]
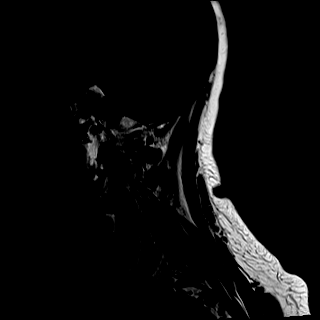
[im 3/15]
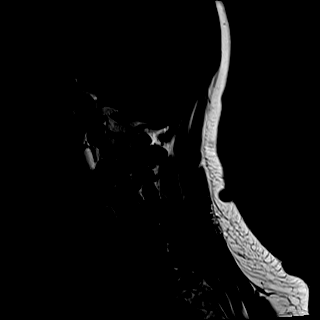
[im 6/15]
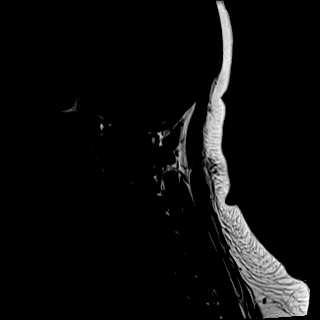
[im 9/15]
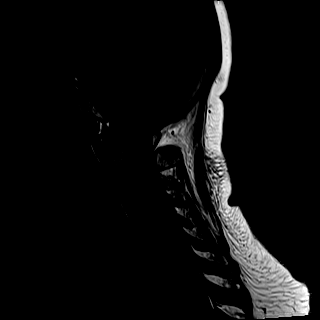
[im 12/15]
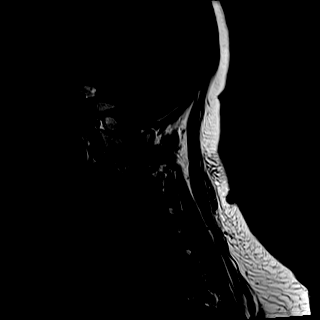
[im 15/15]
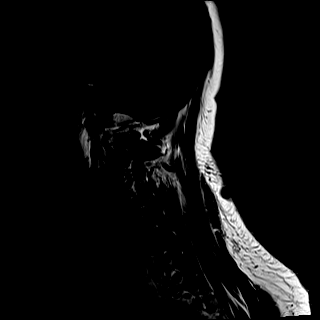

[Series 7: STIR · sagittal · 3.0mm · 0.86mm/px · 6 of 15 slices shown]
[im 1/15]
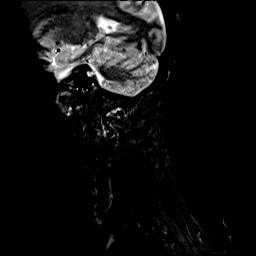
[im 3/15]
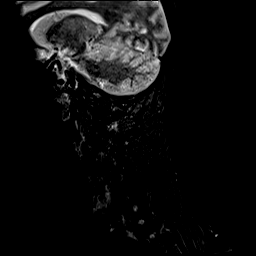
[im 6/15]
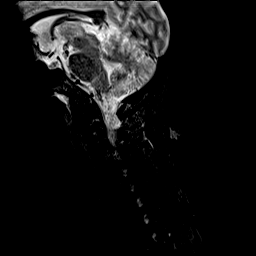
[im 9/15]
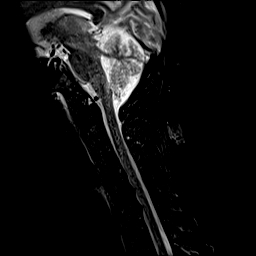
[im 12/15]
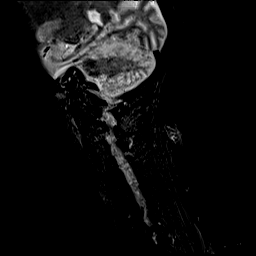
[im 15/15]
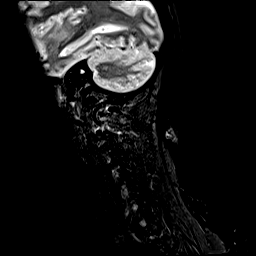

[Series 8: T2 · axial · 3.0mm · 0.66mm/px · z∈[-123,-2]mm · 12 of 40 slices shown (2 of 2)]
[im 1/40]
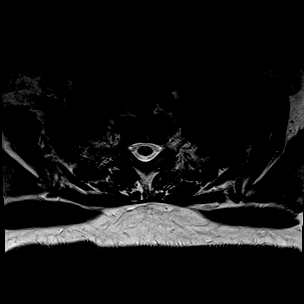
[im 3/40]
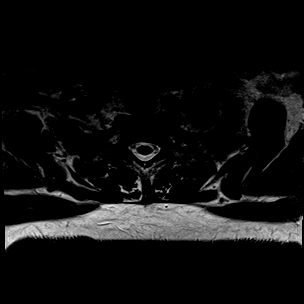
[im 6/40]
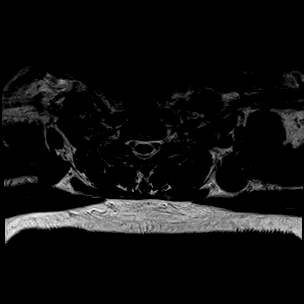
[im 9/40]
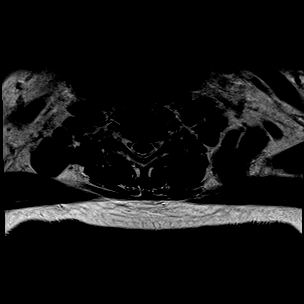
[im 12/40]
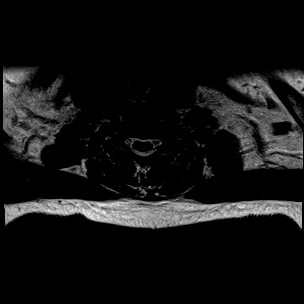
[im 14/40]
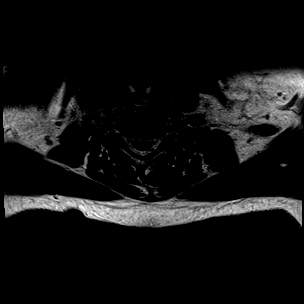
[im 17/40]
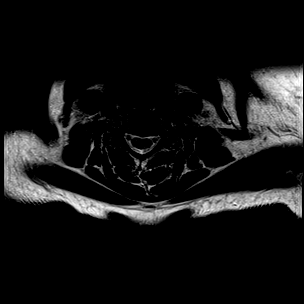
[im 20/40]
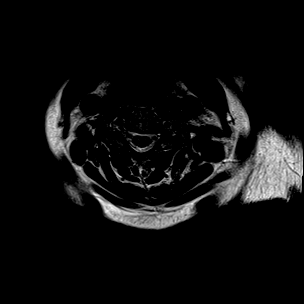
[im 23/40]
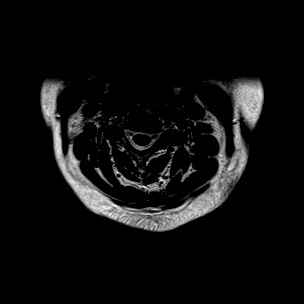
[im 28/40]
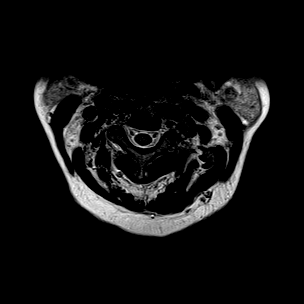
[im 34/40]
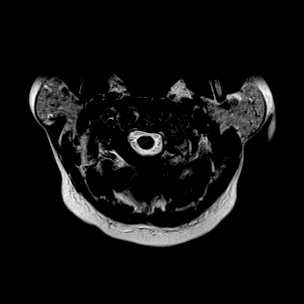
[im 40/40]
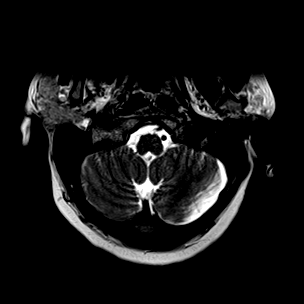

[Series 9: GRE · axial · 3.0mm · 0.39mm/px · z∈[-123,-2]mm · 8 of 40 slices shown]
[im 1/40]
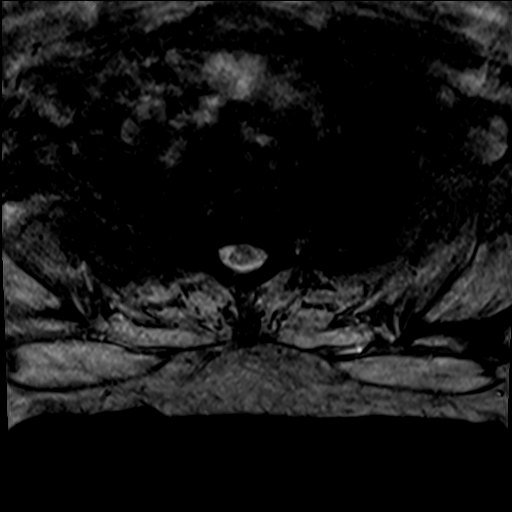
[im 6/40]
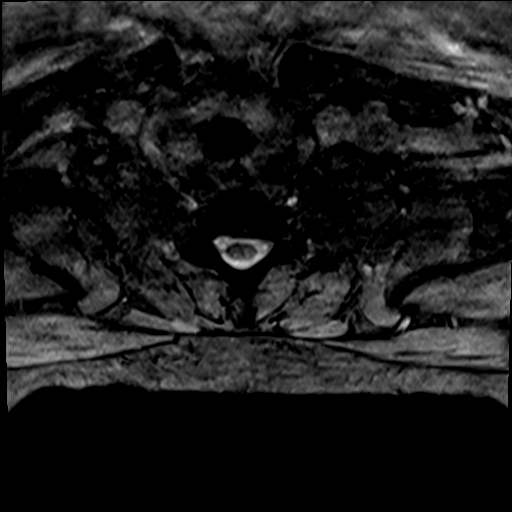
[im 12/40]
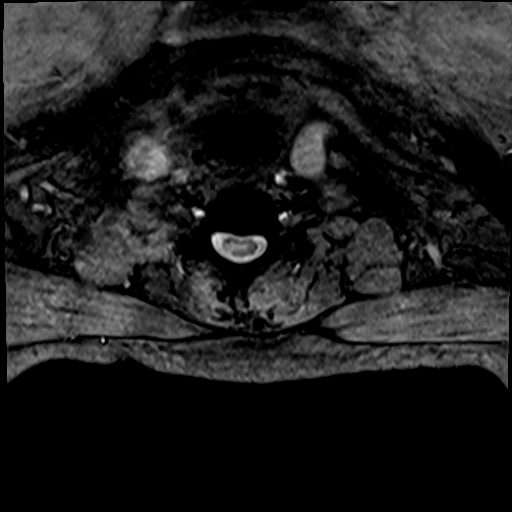
[im 17/40]
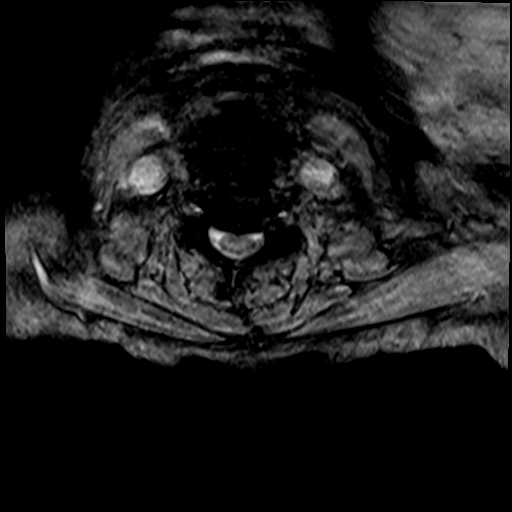
[im 23/40]
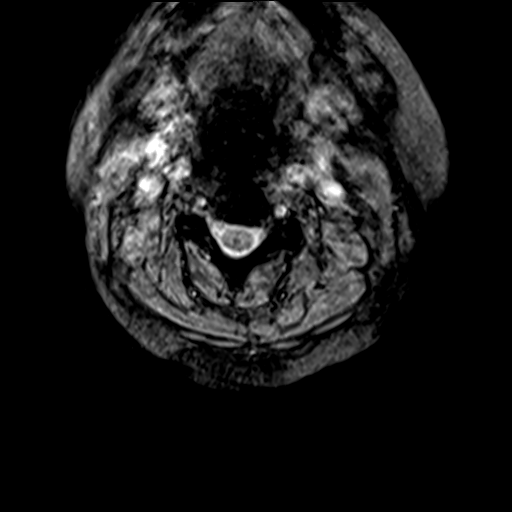
[im 28/40]
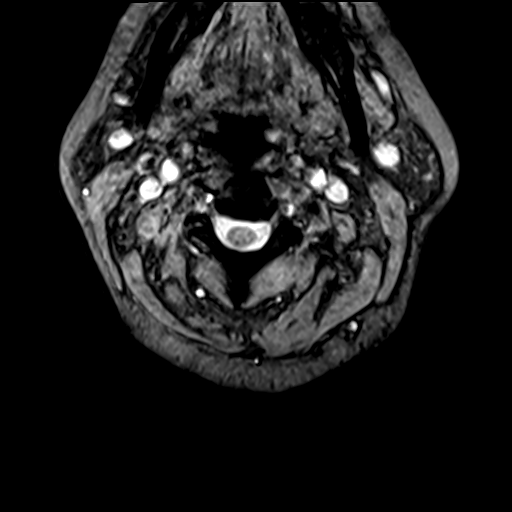
[im 34/40]
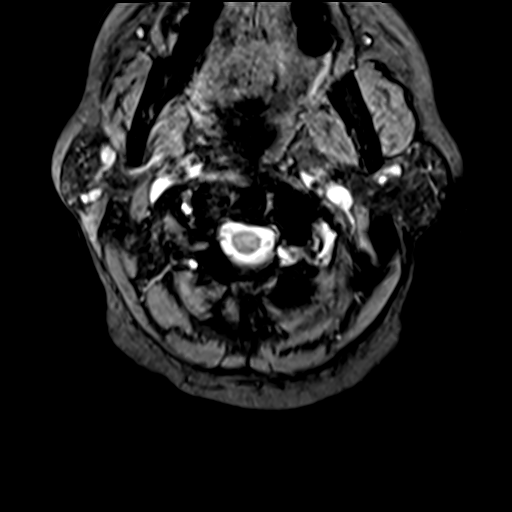
[im 40/40]
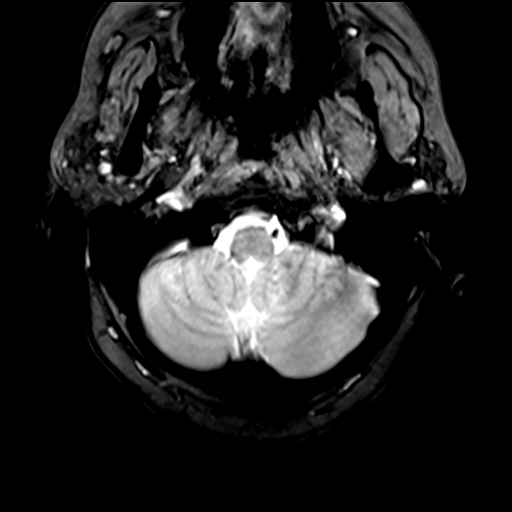

[38 of 48 positions shown; findings below may reference images not displayed]

FINDINGS: Alignment: Straightening of normal cervical lordosis. No static
subluxation.

Vertebrae: No fracture, evidence of discitis, or bone lesion.

Cord: Normal signal and morphology.

Posterior Fossa, vertebral arteries, paraspinal tissues: Negative.

Disc levels:

C1-2: Unremarkable.

C2-3: Normal disc space and facet joints. There is no spinal canal
stenosis. No neural foraminal stenosis.

C3-4: Normal disc space and facet joints. There is no spinal canal
stenosis. No neural foraminal stenosis.

C4-5: Minimal disc bulge. There is no spinal canal stenosis. No
neural foraminal stenosis.

C5-6: Left asymmetric disc bulge. There is no spinal canal stenosis.
Mild left neural foraminal stenosis.

C6-7: Left asymmetric disc bulge. There is no spinal canal stenosis.
No neural foraminal stenosis.

C7-T1: Normal disc space and facet joints. There is no spinal canal
stenosis. No neural foraminal stenosis.
IMPRESSION: 1. No acute abnormality of the cervical spine.
2. Mild left C5-6 neural foraminal stenosis.
3. No spinal canal stenosis.

## 2020-07-22 MED ORDER — POTASSIUM CHLORIDE CRYS ER 20 MEQ PO TBCR: 20.0000 meq | EXTENDED_RELEASE_TABLET | Freq: Every day | ORAL | 0 refills | Status: DC

## 2020-07-22 MED ORDER — FUROSEMIDE 10 MG/ML IJ SOLN
40.0000 mg | Freq: Once | INTRAMUSCULAR | Status: AC
  Administered 2020-07-22: 40 mg via INTRAVENOUS
  Filled 2020-07-22: qty 4

## 2020-07-22 MED ORDER — FUROSEMIDE 20 MG PO TABS: 20.0000 mg | ORAL_TABLET | Freq: Every day | ORAL | 0 refills | Status: DC

## 2020-07-22 MED ORDER — DULOXETINE HCL 20 MG PO CPEP: 20.0000 mg | ORAL_CAPSULE | Freq: Every day | ORAL | 3 refills | Status: DC

## 2020-07-22 MED ORDER — DOXYCYCLINE HYCLATE 100 MG PO TABS
100.0000 mg | ORAL_TABLET | Freq: Two times a day (BID) | ORAL | Status: DC
  Administered 2020-07-22: 100 mg via ORAL
  Filled 2020-07-22: qty 1

## 2020-07-22 MED ORDER — GABAPENTIN 300 MG PO CAPS: 300.0000 mg | ORAL_CAPSULE | Freq: Three times a day (TID) | ORAL | 0 refills | Status: DC

## 2020-07-22 MED ORDER — POTASSIUM CHLORIDE CRYS ER 20 MEQ PO TBCR
40.0000 meq | EXTENDED_RELEASE_TABLET | Freq: Once | ORAL | Status: AC
  Administered 2020-07-22: 40 meq via ORAL
  Filled 2020-07-22: qty 2

## 2020-07-22 MED ORDER — GABAPENTIN 300 MG PO CAPS: 600.0000 mg | ORAL_CAPSULE | Freq: Every day | ORAL | 0 refills | Status: DC

## 2020-07-22 MED ORDER — LIDOCAINE 4 % EX CREA
TOPICAL_CREAM | Freq: Two times a day (BID) | CUTANEOUS | 0 refills | Status: AC
Start: 2020-07-22 — End: ?

## 2020-07-22 MED ORDER — CAPSAICIN 0.025 % EX CREA
TOPICAL_CREAM | Freq: Two times a day (BID) | CUTANEOUS | 0 refills | Status: DC
Start: 2020-07-22 — End: 2022-09-06

## 2020-07-22 MED ORDER — DOXYCYCLINE HYCLATE 100 MG PO TABS: 100.0000 mg | ORAL_TABLET | Freq: Two times a day (BID) | ORAL | 0 refills | Status: AC

## 2020-07-22 NOTE — Progress Notes (Signed)
Patient has ordered for discharge. Given discharge instructions with paper to the patient and family. Iv removed. Given all belongings to the patient and family.

## 2020-07-22 NOTE — Progress Notes (Signed)
Specimen 24 hour urine collection began at [facility] on 07/22/20 with projected end date of 07/23/20 at [facility].   First urine was collected for urinalysis and remainder of the urine was discarded.    Pt aware to make nurse aware when she has voided so that urine can be stored in jug and placed on ice.

## 2020-07-22 NOTE — Discharge Summary (Signed)
Physician Discharge Summary  Miranda Padilla SWF:[id number] DOB: [birth year] DOA: 07/21/2020  PCP: Antony Contras, MD  Admit date: 07/21/2020 Discharge date: 07/22/2020  Admitted From: Home  Disposition:  Home   Recommendations for Outpatient Follow-up:  Follow up with PCP in 1-2 weeks Please obtain BMP/CBC in one week Please follow up on the following pending results: MAG IgM antibodies, hepatitis panel, immunofixation electrophoresis, protein electrophoresis, hemoglobin A1c, HIV.  Needs follow up with neurology  Home Health: out patient PT>  Equipment/Devices:None  Discharge Condition:Stable.  CODE STATUS: Full code Diet recommendation: Heart Healthy    Brief/Interim Summary: 55 year old with past medical history significant for chronic lumbar radiculopathy, morbid obesity, GERD, presented with worsening leg and feet pain and weakness.  Patient has been following with neurology and neurosurgery for the same.  Report of worsening pain for the last 4 days.  Evaluation in the ED: MRI showed mild progression of left recess and foraminal protrusion at L3-L4  1-Small fiber neuropathy, Sciatic neuropathy;  MRI cervical, thoracic lumbar negative for significant nerve entrapment.  Gabapentin dose increase.  Capsaicin and lidocaine started.  Following labs are pending needs to be followed by neurologist as an outpatient: MAG IgM antibodies, hepatitis panel, immunofixation electrophoresis, protein electrophoresis, hemoglobin A1c, HIV. Patient has already an appointment with neurosurgery on Friday, advised to keep that appointment She will also need to follow-up with primary neurologist as an outpatient.  2-Bilateral lower extremity edema: Likely venous stasis.  Will give IV Lasix prior to admission and will provide 3 days of oral Lasix.  We will also provide potassium supplementation.  3-Mild left lower extremity redness: She has small open wound.  Will provide 5 days of doxycycline to  cover for cellulitis.    Discharge Diagnoses:  Active Problems:   Impaired ambulation    Discharge Instructions  Discharge Instructions     Diet - low sodium heart healthy   Complete by: As directed    Increase activity slowly   Complete by: As directed    No wound care   Complete by: As directed       Allergies as of 07/22/2020       Reactions   Other Anaphylaxis, Other (See Comments)   Covid 19 vaccine (brand?)   Sulfonamide Derivatives Itching, Rash   Sulfa Antibiotics Itching, Rash        Medication List     STOP taking these medications    ergocalciferol 1.25 MG (50000 UT) capsule Commonly known as: VITAMIN D2       TAKE these medications    acetaminophen 500 MG tablet Commonly known as: TYLENOL Take 1,000 mg by mouth every 6 (six) hours as needed for mild pain or moderate pain.   Baclofen 5 MG Tabs Take 5 mg by mouth 3 (three) times daily as needed (for muscle spasms).   capsaicin 0.025 % cream Commonly known as: ZOSTRIX Apply topically 2 (two) times daily.   doxycycline 100 MG tablet Commonly known as: VIBRA-TABS Take 1 tablet (100 mg total) by mouth every 12 (twelve) hours for 5 days.   DULoxetine 20 MG capsule Commonly known as: CYMBALTA Take 1 capsule (20 mg total) by mouth daily. Start taking on: July 23, 2200   folic acid 1 MG tablet Commonly known as: FOLVITE Take 1 mg by mouth daily.   furosemide 20 MG tablet Commonly known as: Lasix Take 1 tablet (20 mg total) by mouth daily.   gabapentin 300 MG capsule Commonly known as: NEURONTIN Take 2 capsules (600  mg total) by mouth at bedtime. What changed: You were already taking a medication with the same name, and this prescription was added. Make sure you understand how and when to take each.   gabapentin 300 MG capsule Commonly known as: NEURONTIN Take 1 capsule (300 mg total) by mouth 3 (three) times daily. What changed: Another medication with the same name was added. Make  sure you understand how and when to take each.   lidocaine 4 % cream Commonly known as: LMX Apply topically 2 (two) times daily.   oxyCODONE 5 MG immediate release tablet Commonly known as: Oxy IR/ROXICODONE Take 5 mg by mouth every 6 (six) hours as needed for severe pain or moderate pain.   potassium chloride SA 20 MEQ tablet Commonly known as: KLOR-CON Take 1 tablet (20 mEq total) by mouth daily for 3 days.   predniSONE 1 MG tablet Commonly known as: DELTASONE Take 3 mg by mouth daily with breakfast.   zonisamide 100 MG capsule Commonly known as: ZONEGRAN Take 200 mg by mouth daily.        Allergies  Allergen Reactions   Other Anaphylaxis and Other (See Comments)    Covid 19 vaccine (brand?)   Sulfonamide Derivatives Itching and Rash   Sulfa Antibiotics Itching and Rash    Consultations: Neurology   Procedures/Studies: MR CERVICAL SPINE WO CONTRAST  Result Date: 07/22/2020 CLINICAL DATA:  Progressive myelopathy EXAM: MRI CERVICAL SPINE WITHOUT CONTRAST TECHNIQUE: Multiplanar, multisequence MR imaging of the cervical spine was performed. No intravenous contrast was administered. COMPARISON:  None. FINDINGS: Alignment: Straightening of normal cervical lordosis. No static subluxation. Vertebrae: No fracture, evidence of discitis, or bone lesion. Cord: Normal signal and morphology. Posterior Fossa, vertebral arteries, paraspinal tissues: Negative. Disc levels: C1-2: Unremarkable. C2-3: Normal disc space and facet joints. There is no spinal canal stenosis. No neural foraminal stenosis. C3-4: Normal disc space and facet joints. There is no spinal canal stenosis. No neural foraminal stenosis. C4-5: Minimal disc bulge. There is no spinal canal stenosis. No neural foraminal stenosis. C5-6: Left asymmetric disc bulge. There is no spinal canal stenosis. Mild left neural foraminal stenosis. C6-7: Left asymmetric disc bulge. There is no spinal canal stenosis. No neural foraminal  stenosis. C7-T1: Normal disc space and facet joints. There is no spinal canal stenosis. No neural foraminal stenosis. IMPRESSION: 1. No acute abnormality of the cervical spine. 2. Mild left C5-6 neural foraminal stenosis. 3. No spinal canal stenosis. Electronically Signed   By: Ulyses Jarred M.D.   On: 07/22/2020 02:22   MR THORACIC SPINE WO CONTRAST  Result Date: 07/21/2020 CLINICAL DATA:  Numbness in both feet and difficulty walking. Back pain radiating into both legs. Increasing lower extremity weakness. EXAM: MRI THORACIC SPINE WITHOUT CONTRAST TECHNIQUE: Multiplanar, multisequence MR imaging of the thoracic spine was performed. No intravenous contrast was administered. COMPARISON:  None. FINDINGS: Alignment:  Normal. Vertebrae: No fracture, evidence of discitis, or bone lesion. Cord: Although patient motion limits evaluation, no cord signal abnormality is identified. Paraspinal and other soft tissues: Negative. Disc levels: Shallow central protrusions are seen at T4-5, T5-6, T6-7 and T7-8. There is a minimal disc bulge at T8-9. No stenosis is identified. IMPRESSION: Limited examination due to patient motion but no cord signal abnormality is identified. Mild thoracic degenerative change without stenosis. Electronically Signed   By: Inge Rise M.D.   On: 07/21/2020 13:58   MR LUMBAR SPINE WO CONTRAST  Result Date: 07/21/2020 CLINICAL DATA:  Increasing low back pain radiating into  both legs. No known injury. EXAM: MRI LUMBAR SPINE WITHOUT CONTRAST TECHNIQUE: Multiplanar, multisequence MR imaging of the lumbar spine was performed. No intravenous contrast was administered. COMPARISON:  MRI lumbar spine with and without contrast 03/17/2020. CT chest, abdomen and pelvis 05/29/2020. FINDINGS: Segmentation:  Standard. Alignment:  Normal. Vertebrae:  No fracture, evidence of discitis, or bone lesion. Conus medullaris and cauda equina: Conus extends to the L1-2 level. Conus and cauda equina appear normal.  Paraspinal and other soft tissues: Again seen is retroperitoneal lymphadenopathy which is not notably changed compared to the prior CT. Disc levels: L1-2: Negative. L2-3: Negative. L3-4: Bilateral facet degenerative disease with associated effusions, greater on the right. There is ligamentum flavum thickening and a disc bulge. A superimposed left subarticular recess and foraminal protrusion appears slightly increased in size compared to the prior exam. There is mild narrowing in the left subarticular recess and foramen. Mild central canal stenosis is present. The right foramen is open. L4-5: Shallow disc bulge and bilateral facet degenerative disease. There is some ligamentum flavum thickening. No stenosis or change. L5-S1: Negative for disc bulge or protrusion. Bilateral facet degenerative change. No stenosis. IMPRESSION: Mild progression of a left subarticular recess and foraminal protrusion at L3-4 causing mild narrowing in the subarticular recess and foramen. No nerve root compression is identified. No change in multilevel facet arthropathy. Electronically Signed   By: Inge Rise M.D.   On: 07/21/2020 13:54     Subjective: She report pain same. Even without LE edema she has numbness sensation in her feet and heaviness.   Discharge Exam: Vitals:   07/22/20 0353 07/22/20 0800  BP: 121/74 116/77  Pulse: 99 93  Resp: 20 19  Temp: 99.6 F (37.6 C) 98.9 F (37.2 C)  SpO2: 97% 98%     General: Pt is alert, awake, not in acute distress Cardiovascular: RRR, S1/S2 +, no rubs, no gallops Respiratory: CTA bilaterally, no wheezing, no rhonchi Abdominal: Soft, NT, ND, bowel sounds + Extremities:  LE edema, mild redness right LE.     The results of significant diagnostics from this hospitalization (including imaging, microbiology, ancillary and laboratory) are listed below for reference.     Microbiology: Recent Results (from the past 240 hour(s))  Resp Panel by RT-PCR (Flu A&B, Covid)  Nasopharyngeal Swab     Status: None   Collection Time: 07/21/20  6:15 AM   Specimen: Nasopharyngeal Swab; Nasopharyngeal(NP) swabs in vial transport medium  Result Value Ref Range Status   SARS Coronavirus 2 by RT PCR NEGATIVE NEGATIVE Final    Comment: (NOTE) SARS-CoV-2 target nucleic acids are NOT DETECTED.  The SARS-CoV-2 RNA is generally detectable in upper respiratory specimens during the acute phase of infection. The lowest concentration of SARS-CoV-2 viral copies this assay can detect is 138 copies/mL. A negative result does not preclude SARS-Cov-2 infection and should not be used as the sole basis for treatment or other patient management decisions. A negative result may occur with  improper specimen collection/handling, submission of specimen other than nasopharyngeal swab, presence of viral mutation(s) within the areas targeted by this assay, and inadequate number of viral copies(<138 copies/mL). A negative result must be combined with clinical observations, patient history, and epidemiological information. The expected result is Negative.  Fact Sheet for Patients:  EntrepreneurPulse.com.au  Fact Sheet for Healthcare Providers:  IncredibleEmployment.be  This test is no t yet approved or cleared by the [place] FDA and  has been authorized for detection and/or diagnosis of SARS-CoV-2 by FDA under  an Emergency Use Authorization (EUA). This EUA will remain  in effect (meaning this test can be used) for the duration of the COVID-19 declaration under Section 564(b)(1) of the Act, 21 U.S.C.section 360bbb-3(b)(1), unless the authorization is terminated  or revoked sooner.       Influenza A by PCR NEGATIVE NEGATIVE Final   Influenza B by PCR NEGATIVE NEGATIVE Final    Comment: (NOTE) The Xpert Xpress SARS-CoV-2/FLU/RSV plus assay is intended as an aid in the diagnosis of influenza from Nasopharyngeal swab specimens and should not be  used as a sole basis for treatment. Nasal washings and aspirates are unacceptable for Xpert Xpress SARS-CoV-2/FLU/RSV testing.  Fact Sheet for Patients: EntrepreneurPulse.com.au  Fact Sheet for Healthcare Providers: IncredibleEmployment.be  This test is not yet approved or cleared by the [place] FDA and has been authorized for detection and/or diagnosis of SARS-CoV-2 by FDA under an Emergency Use Authorization (EUA). This EUA will remain in effect (meaning this test can be used) for the duration of the COVID-19 declaration under Section 564(b)(1) of the Act, 21 U.S.C. section 360bbb-3(b)(1), unless the authorization is terminated or revoked.  Performed at [facility], [address] [place], [place] [address]      Labs: BNP (last 3 results) No results for input(s): BNP in the last 8760 hours. Basic Metabolic Panel: Recent Labs  Lab 07/21/20 0610 07/22/20 0758  NA 138 138  K 4.1 3.6  CL 106 107  CO2 23 24  GLUCOSE 112* 99  BUN 18 11  CREATININE 0.72 0.74  CALCIUM 8.8* 8.6*   Liver Function Tests: No results for input(s): AST, ALT, ALKPHOS, BILITOT, PROT, ALBUMIN in the last 168 hours. No results for input(s): LIPASE, AMYLASE in the last 168 hours. No results for input(s): AMMONIA in the last 168 hours. CBC: Recent Labs  Lab 07/21/20 0610 07/22/20 0758  WBC 3.8* 3.1*  NEUTROABS 2.2  --   HGB 11.1* 10.7*  HCT 35.0* 34.1*  MCV 79.2* 79.3*  PLT 297 287   Cardiac Enzymes: No results for input(s): CKTOTAL, CKMB, CKMBINDEX, TROPONINI in the last 168 hours. BNP: Invalid input(s): POCBNP CBG: No results for input(s): GLUCAP in the last 168 hours. D-Dimer No results for input(s): DDIMER in the last 72 hours. Hgb A1c No results for input(s): HGBA1C in the last 72 hours. Lipid Profile No results for input(s): CHOL, HDL, LDLCALC, TRIG, CHOLHDL, LDLDIRECT in the last 72 hours. Thyroid function studies Recent  Labs    07/22/20 0250  TSH 0.773   Anemia work up No results for input(s): VITAMINB12, FOLATE, FERRITIN, TIBC, IRON, RETICCTPCT in the last 72 hours. Urinalysis    Component Value Date/Time   COLORURINE YELLOW 07/22/2020 0119   APPEARANCEUR CLEAR 07/22/2020 0119   LABSPEC 1.014 07/22/2020 0119   PHURINE 5.0 07/22/2020 0119   GLUCOSEU NEGATIVE 07/22/2020 0119   HGBUR NEGATIVE 07/22/2020 0119   BILIRUBINUR NEGATIVE 07/22/2020 0119   KETONESUR 5 (A) 07/22/2020 0119   PROTEINUR NEGATIVE 07/22/2020 0119   UROBILINOGEN 0.2 01/07/2011 0745   NITRITE NEGATIVE 07/22/2020 0119   LEUKOCYTESUR NEGATIVE 07/22/2020 0119   Sepsis Labs Invalid input(s): PROCALCITONIN,  WBC,  LACTICIDVEN Microbiology Recent Results (from the past 240 hour(s))  Resp Panel by RT-PCR (Flu A&B, Covid) Nasopharyngeal Swab     Status: None   Collection Time: 07/21/20  6:15 AM   Specimen: Nasopharyngeal Swab; Nasopharyngeal(NP) swabs in vial transport medium  Result Value Ref Range Status   SARS Coronavirus 2 by  RT PCR NEGATIVE NEGATIVE Final    Comment: (NOTE) SARS-CoV-2 target nucleic acids are NOT DETECTED.  The SARS-CoV-2 RNA is generally detectable in upper respiratory specimens during the acute phase of infection. The lowest concentration of SARS-CoV-2 viral copies this assay can detect is 138 copies/mL. A negative result does not preclude SARS-Cov-2 infection and should not be used as the sole basis for treatment or other patient management decisions. A negative result may occur with  improper specimen collection/handling, submission of specimen other than nasopharyngeal swab, presence of viral mutation(s) within the areas targeted by this assay, and inadequate number of viral copies(<138 copies/mL). A negative result must be combined with clinical observations, patient history, and epidemiological information. The expected result is Negative.  Fact Sheet for Patients:   EntrepreneurPulse.com.au  Fact Sheet for Healthcare Providers:  IncredibleEmployment.be  This test is no t yet approved or cleared by the [place] FDA and  has been authorized for detection and/or diagnosis of SARS-CoV-2 by FDA under an Emergency Use Authorization (EUA). This EUA will remain  in effect (meaning this test can be used) for the duration of the COVID-19 declaration under Section 564(b)(1) of the Act, 21 U.S.C.section 360bbb-3(b)(1), unless the authorization is terminated  or revoked sooner.       Influenza A by PCR NEGATIVE NEGATIVE Final   Influenza B by PCR NEGATIVE NEGATIVE Final    Comment: (NOTE) The Xpert Xpress SARS-CoV-2/FLU/RSV plus assay is intended as an aid in the diagnosis of influenza from Nasopharyngeal swab specimens and should not be used as a sole basis for treatment. Nasal washings and aspirates are unacceptable for Xpert Xpress SARS-CoV-2/FLU/RSV testing.  Fact Sheet for Patients: EntrepreneurPulse.com.au  Fact Sheet for Healthcare Providers: IncredibleEmployment.be  This test is not yet approved or cleared by the [place] FDA and has been authorized for detection and/or diagnosis of SARS-CoV-2 by FDA under an Emergency Use Authorization (EUA). This EUA will remain in effect (meaning this test can be used) for the duration of the COVID-19 declaration under Section 564(b)(1) of the Act, 21 U.S.C. section 360bbb-3(b)(1), unless the authorization is terminated or revoked.  Performed at [facility], [address] [place], [place] [address]      Time coordinating discharge: 40 minutes  SIGNED:   Elmarie Shiley, MD  [facility]

## 2020-07-22 NOTE — Progress Notes (Signed)
No acute issues on C-sp MRI Recs as in consult. OP neurology f/u PT OT  -- Amie Portland, MD Neurologist [facility] Pager: [phone]

## 2020-07-22 NOTE — TOC Initial Note (Signed)
Transition of Care [facility]) - Initial/Assessment Note    Patient Details  Name: Miranda Padilla MRN: [id number] Date of Birth: [birth year]  Transition of Care [facility]) CM/SW Contact:    Marilu Favre, RN Phone Number: 07/22/2020, 12:00 PM  Clinical Narrative:                  Patient from home with husband . PT recommending OP PT and walker. Discussed OP PT with patient she does have transportation and [place] location . Information placed on AVS. Explained to patient if she has not heard from them in a day or two her her to call them . Voiced understanding. Confirmed patient's contact information.   Ordered walker with [facility]  Expected Discharge Plan: Home/Self Care     Patient Goals and CMS Choice Patient states their goals for this hospitalization and ongoing recovery are:: to return tohome CMS Medicare.gov Compare Post Acute Care list provided to:: Patient Choice offered to / list presented to : Patient  Expected Discharge Plan and Services Expected Discharge Plan: Home/Self Care   Discharge Planning Services: CM Consult   Living arrangements for the past 2 months: Single Family Home Expected Discharge Date: 07/22/20               DME Arranged: Gilford Rile rolling DME Agency: [facility] Date DME Agency Contacted: 07/22/20 Time DME Agency Contacted: [phone]   [place] Arranged: NA          Prior Living Arrangements/Services Living arrangements for the past 2 months: Single Family Home Lives with:: Spouse Patient language and need for interpreter reviewed:: Yes Do you feel safe going back to the place where you live?: Yes      Need for Family Participation in Patient Care: Yes (Comment) Care giver support system in place?: Yes (comment)   Criminal Activity/Legal Involvement Pertinent to Current Situation/Hospitalization: No - Comment as needed  Activities of Daily Living Home Assistive Devices/Equipment: Grab bars in shower, Shower chair without back, Blood  pressure cuff, Cane (specify quad or straight) ADL Screening (condition at time of admission) Patient's cognitive ability adequate to safely complete daily activities?: No Is the patient deaf or have difficulty hearing?: No Does the patient have difficulty seeing, even when wearing glasses/contacts?: No Does the patient have difficulty concentrating, remembering, or making decisions?: No Patient able to express need for assistance with ADLs?: Yes Does the patient have difficulty dressing or bathing?: No Independently performs ADLs?: Yes (appropriate for developmental age) Does the patient have difficulty walking or climbing stairs?: Yes Weakness of Legs: Both Weakness of Arms/Hands: None  Permission Sought/Granted   Permission granted to share information with : No              Emotional Assessment Appearance:: Appears stated age Attitude/Demeanor/Rapport: Engaged Affect (typically observed): Accepting Orientation: : Oriented to Self, Oriented to Place, Oriented to  Time, Oriented to Situation Alcohol / Substance Use: Not Applicable Psych Involvement: No (comment)  Admission diagnosis:  Lumbosacral radiculopathy [M54.17] Weakness of both lower extremities [R29.898] Impaired ambulation [R26.2] Patient Active Problem List   Diagnosis Date Noted   Impaired ambulation 07/21/2020   Lumbosacral radiculopathy    Weakness of both lower extremities    IDA (iron deficiency anemia) 05/24/2020   Symptomatic cholelithiasis 01/31/2011   ALLERGIC RHINITIS 07/02/2008   HEADACHE, CHRONIC 07/02/2008   DYSPNEA 07/02/2008   COUGH 07/02/2008   OBESITY 07/01/2008   SICKLE CELL TRAIT 07/01/2008   OBSESSIVE-COMPULSIVE DISORDER 07/01/2008   DEPRESSION 07/01/2008  PCP:  Antony Contras, MD Pharmacy:   [facility] [place], [address] [place] [address] Phone: [phone] Fax: [phone]     Social Determinants of  Health (SDOH) Interventions    Readmission Risk Interventions No flowsheet data found.

## 2020-07-22 NOTE — Evaluation (Signed)
Physical Therapy Evaluation Patient Details Name: Miranda Padilla MRN: [id number] DOB: 08-18-1965 Today's Date: 07/22/2020   History of Present Illness  Pt adm with worsening BLE pain and weakness likely due to worsening of lumbar radiculopathy. PMH - Rheumatoid arthritis, chronic lumbar radiculopathy, morbid obesity, depression, lymphadenopathy.  Clinical Impression  Pt presents to PT with significant abnormalities of gait which pt reports have worsened recently. Pt can benefit from RW and OPPT to further address deficits.     Follow Up Recommendations Outpatient PT    Equipment Recommendations  Rolling walker with 5" wheels    Recommendations for Other Services       Precautions / Restrictions Precautions Precautions: Fall Restrictions Weight Bearing Restrictions: No      Mobility  Bed Mobility Overal bed mobility: Independent                  Transfers Overall transfer level: Modified independent Equipment used: Straight cane;Rolling walker (2 wheeled)             General transfer comment: Incr time to rise. Wide base  Ambulation/Gait Ambulation/Gait assistance: Supervision Gait Distance (Feet): 100 Feet Assistive device: Straight cane;Rolling walker (2 wheeled) Gait Pattern/deviations: Step-through pattern;Decreased step length - right;Decreased step length - left;Decreased dorsiflexion - right;Decreased dorsiflexion - left;Steppage;Ataxic;Wide base of support Gait velocity: decr Gait velocity interpretation: 1.31 - 2.62 ft/sec, indicative of limited community ambulator General Gait Details: Pt with wide based steppage gait and poor coordination. Pt with incr stability using rolling walker vs cane.  Stairs            Wheelchair Mobility    Modified Rankin (Stroke Patients Only)       Balance Overall balance assessment: Needs assistance Sitting-balance support: No upper extremity supported;Feet supported Sitting balance-Leahy Scale:  Normal     Standing balance support: No upper extremity supported Standing balance-Leahy Scale: Fair Standing balance comment: UE assist for any dynamic movement                             Pertinent Vitals/Pain      Home Living Family/patient expects to be discharged to:: Private residence Living Arrangements: Spouse/significant other Available Help at Discharge: Family;Available PRN/intermittently Type of Home: House Home Access: Stairs to enter Entrance Stairs-Rails: None Entrance Stairs-Number of Steps: 2 Home Layout: One level Home Equipment: Cane - single point;Wheelchair - manual      Prior Function Level of Independence: Independent with assistive device(s)         Comments: Pt uses w/c in the house since February for cooking etc. Uses cane. Husband assist with stairs     Hand Dominance        Extremity/Trunk Assessment   Upper Extremity Assessment Upper Extremity Assessment: Overall WFL for tasks assessed    Lower Extremity Assessment Lower Extremity Assessment: RLE deficits/detail;LLE deficits/detail RLE Deficits / Details: ankles <3/5. Functionally decr stability RLE Sensation: decreased light touch;decreased proprioception RLE Coordination: decreased gross motor;decreased fine motor LLE Deficits / Details: ankles <3/5. Functionally decr stability LLE Sensation: decreased light touch;decreased proprioception LLE Coordination: decreased gross motor;decreased fine motor       Communication   Communication: No difficulties  Cognition Arousal/Alertness: Awake/alert Behavior During Therapy: WFL for tasks assessed/performed Overall Cognitive Status: Within Functional Limits for tasks assessed  General Comments      Exercises     Assessment/Plan    PT Assessment All further PT needs can be met in the next venue of care  PT Problem List Decreased strength;Decreased  balance;Decreased mobility;Decreased coordination;Impaired sensation       PT Treatment Interventions      PT Goals (Current goals can be found in the Care Plan section)  Acute Rehab PT Goals PT Goal Formulation: All assessment and education complete, DC therapy    Frequency     Barriers to discharge        Co-evaluation               AM-PAC PT "6 Clicks" Mobility  Outcome Measure Help needed turning from your back to your side while in a flat bed without using bedrails?: None Help needed moving from lying on your back to sitting on the side of a flat bed without using bedrails?: None Help needed moving to and from a bed to a chair (including a wheelchair)?: None Help needed standing up from a chair using your arms (e.g., wheelchair or bedside chair)?: None Help needed to walk in hospital room?: A Little Help needed climbing 3-5 steps with a railing? : A Little 6 Click Score: 22    End of Session   Activity Tolerance: Patient tolerated treatment well Patient left: in bed;with call bell/phone within reach;with family/visitor present (sitting EOB) Nurse Communication: Other (comment) (Dc recommendations) PT Visit Diagnosis: Unsteadiness on feet (R26.81);Other symptoms and signs involving the nervous system (R29.898)    Time: 5009-3818 PT Time Calculation (min) (ACUTE ONLY): 13 min   Charges:   PT Evaluation $PT Eval Low Complexity: [place] Pager [phone] Office [place] 07/22/2020, 12:03 PM

## 2020-07-23 LAB — PROTEIN ELECTROPHORESIS, SERUM
A/G Ratio: 0.9 (ref 0.7–1.7)
Albumin ELP: 3.1 g/dL (ref 2.9–4.4)
Alpha-1-Globulin: 0.2 g/dL (ref 0.0–0.4)
Alpha-2-Globulin: 0.6 g/dL (ref 0.4–1.0)
Beta Globulin: 1.1 g/dL (ref 0.7–1.3)
Gamma Globulin: 1.6 g/dL (ref 0.4–1.8)
Globulin, Total: 3.4 g/dL (ref 2.2–3.9)
Total Protein ELP: 6.5 g/dL (ref 6.0–8.5)

## 2020-07-23 LAB — HEPATITIS B SURFACE ANTIBODY, QUANTITATIVE: Hep B S AB Quant (Post): 34.5 m[IU]/mL (ref >9.9)

## 2020-07-24 LAB — IMMUNOFIXATION ELECTROPHORESIS
IgA: 490 mg/dL — ABNORMAL HIGH (ref 87–352)
IgG (Immunoglobin G), Serum: 1722 mg/dL — ABNORMAL HIGH (ref 586–1602)
IgM (Immunoglobulin M), Srm: 84 mg/dL (ref 26–217)
Total Protein ELP: 6.8 g/dL (ref 6.0–8.5)

## 2020-07-27 LAB — MISC LABCORP TEST (SEND OUT): Labcorp test code: 140385

## 2020-07-28 ENCOUNTER — Inpatient Hospital Stay: Payer: 59 | Attending: Family

## 2020-07-28 ENCOUNTER — Other Ambulatory Visit: Payer: Self-pay

## 2020-07-28 VITALS — BP 125/79 | HR 80 | Temp 98.0°F | Resp 17

## 2020-07-28 DIAGNOSIS — D509 Iron deficiency anemia, unspecified: Secondary | ICD-10-CM | POA: Diagnosis not present

## 2020-07-28 MED ORDER — NA FERRIC GLUC CPLX IN SUCROSE 12.5 MG/ML IV SOLN
125.0000 mg | Freq: Once | INTRAVENOUS | Status: AC
  Administered 2020-07-28: 125 mg via INTRAVENOUS
  Filled 2020-07-28: qty 10

## 2020-07-28 MED ORDER — SODIUM CHLORIDE 0.9 % IV SOLN
Freq: Once | INTRAVENOUS | Status: AC
  Filled 2020-07-28: qty 250

## 2020-07-28 NOTE — Patient Instructions (Signed)

## 2020-07-29 LAB — MAG INTERPRETATION REFLEXED

## 2020-07-29 LAB — MAG IGM ANTIBODIES: MAG IgM Antibodies: <900 BTU (ref 0–999)

## 2020-08-03 ENCOUNTER — Other Ambulatory Visit: Payer: Self-pay

## 2020-08-03 ENCOUNTER — Ambulatory Visit: Payer: 59 | Attending: Internal Medicine | Admitting: Physical Therapy

## 2020-08-03 DIAGNOSIS — M5442 Lumbago with sciatica, left side: Secondary | ICD-10-CM | POA: Diagnosis present

## 2020-08-03 DIAGNOSIS — R293 Abnormal posture: Secondary | ICD-10-CM | POA: Diagnosis present

## 2020-08-03 DIAGNOSIS — R2689 Other abnormalities of gait and mobility: Secondary | ICD-10-CM | POA: Insufficient documentation

## 2020-08-03 DIAGNOSIS — M5441 Lumbago with sciatica, right side: Secondary | ICD-10-CM | POA: Diagnosis present

## 2020-08-03 DIAGNOSIS — M6281 Muscle weakness (generalized): Secondary | ICD-10-CM | POA: Diagnosis not present

## 2020-08-03 DIAGNOSIS — G8929 Other chronic pain: Secondary | ICD-10-CM | POA: Diagnosis present

## 2020-08-03 NOTE — Patient Instructions (Signed)
Access Code: [email or web address] URL: [email or web address] Date: 08/03/2020 Prepared by: Mady Haagensen  Exercises Supine Posterior Pelvic Tilt - 1-2 x daily - 7 x weekly - 1 sets - 10 reps Supine Anterior Pelvic Tilt - 1-2 x daily - 7 x weekly - 1 sets - 10 reps Lower Trunk Rotations - 1-2 x daily - 7 x weekly - 1 sets - 10 reps

## 2020-08-03 NOTE — Therapy (Signed)
[facility] [address] [place], [place], [address] Phone: [phone]   Fax:  [phone]  Physical Therapy Evaluation  Patient Details  Name: Miranda Padilla MRN: [id number] Date of Birth: 07-24-1965 Referring Provider (PT): (Dr. Corey Harold   Encounter Date: 08/03/2020   PT End of Session - 08/03/20 2011     Visit Number 1    Number of Visits 13    Date for PT Re-Evaluation 09/10/20    Authorization Type U[employer]    PT Start Time 1020    PT Stop Time 1104    PT Time Calculation (min) 44 min    Activity Tolerance Patient tolerated treatment well   Pt reports feeling better after initial lumbar flexibility exercises   Behavior During Therapy WFL for tasks assessed/performed             Past Medical History:  Diagnosis Date   Abdominal distension    Abdominal pain    Anemia    Anxiety    Arthritis    Asthma    Bronchitis    Constipation    Cough    Depression    Family history of adverse reaction to anesthesia    Pt mother would wake up during procedures   GERD (gastroesophageal reflux disease)    Headache(784.0)    Nausea & vomiting    Obsessive compulsive disorder    not on any medication at this time   Sickle cell trait ([facility])    Thalassemia     Past Surgical History:  Procedure Laterality Date   AXILLARY LYMPH NODE BIOPSY Right 07/01/2020   Procedure: EXCISIONAL BIOPSY RIGHT AXILLARY LYMPH NODE;  Surgeon: Coralie Keens, MD;  Location: [facility];  Service: General;  Laterality: Right;   CHOLECYSTECTOMY  02/03/2011   Procedure: LAPAROSCOPIC CHOLECYSTECTOMY;  Surgeon: Harl Bowie, MD;  Location: [facility];  Service: General;  Laterality: N/A;  Laparoscopic Choleystectomy   COLONOSCOPY WITH PROPOFOL N/A 06/01/2016   Procedure: COLONOSCOPY WITH PROPOFOL;  Surgeon: Ronnette Juniper, MD;  Location: [facility];  Service: Gastroenterology;  Laterality: N/A;   MOUTH SURGERY  2000     There were no vitals filed for this visit.    Subjective Assessment - 08/03/20 1023     Subjective All this started in February, numbness on outer side of feet, and then pain/weakness in legs.  Pt went to ED at the end of June because of weakness/pain/unable to bend R leg.  Whole bottom, heel, top of feet are numb, prickly, tingling.  When I straighten my legs, feel it shooting down to my heels. Using RW now, was using cane prior to ED visit.    Pertinent History Had been going thorugh assessment of lymph nodes to r/o cancer (no cancer per pt report); NCV normal    Limitations Standing;Walking;House hold activities    Diagnostic tests MRI mild progression of L recess/foraminal protrusion of L3-4    Patient Stated Goals Pt's goals are to get back to walking independently.    Currently in Pain? Yes    Pain Score 6     Pain Location Arm   toes, heel, outer side   Pain Orientation Right;Left    Pain Descriptors / Indicators Aching;Throbbing    Pain Type Acute pain    Pain Onset 1 to 4 weeks ago    Pain Frequency Constant    Aggravating Factors  certain movements    Pain Relieving Factors gabapentin/cymbalta seems to help  Effect of Pain on Daily Activities Will address pain through exercise/stretching as appropriate    Multiple Pain Sites Yes    Pain Score 5    Pain Location Buttocks   into thighs   Pain Orientation Right;Left    Pain Descriptors / Indicators Aching;Throbbing    Pain Type Acute pain    Pain Onset 1 to 4 weeks ago    Pain Frequency Constant    Aggravating Factors  certain movements    Pain Relieving Factors medications                [facility] PT Assessment - 08/03/20 0001       Assessment   Medical Diagnosis weakness BLEs    Referring Provider (PT) (Dr. Corey Harold    Onset Date/Surgical Date 07/22/20   MD order upon d/c from hospital     Precautions   Precautions [place]   Has the patient fallen in the past 6 months No    Has the  patient had a decrease in activity level because of a fear of falling?  Yes    Is the patient reluctant to leave their home because of a fear of falling?  No      Home Ecologist residence    Living Arrangements Spouse/significant other    Available Help at Discharge Family    Type of [place] to enter    Entrance Stairs-Number of Steps 2    [place] One level    [place] - single point;Walker - 2 wheels;Wheelchair - manual   manual w/c (borrowed for in the home)     Prior Function   Level of Independence Independent    Vocation Full time employment    Biomedical scientist Works as Optometrist      Observation/Other Assessments   Focus on Therapeutic Outcomes (FOTO)  NA      Sensation   Light Touch Impaired by gross assessment   lateral aspects of feet     Posture/Postural Control   Posture/Postural Control Postural limitations    Postural Limitations Rounded Shoulders;Anterior pelvic tilt;Increased lumbar lordosis      ROM / Strength   AROM / PROM / Strength AROM;Strength      AROM   Overall AROM  Within functional limits for tasks performed    Overall AROM Comments pain in center of low back with hip flexion; pain in heel bilaterally with knee extension; ankle dorsiflexion limited bilat due to heel pain      Strength   Overall Strength Deficits    Strength Assessment Site Hip;Knee;Ankle    Right/Left Hip Right;Left    Right Hip Flexion 4/5    Left Hip Flexion 3+/5   ataxic/jerky movements with resistance   Right/Left Knee Right;Left    Right Knee Flexion 4/5    Right Knee Extension 4/5    Left Knee Flexion 3+/5   Jerky movements with resistance   Left Knee Extension 3+/5   Jerky movements with resistance   Right/Left Ankle Right;Left    Right Ankle Dorsiflexion 3+/5    Left Ankle Dorsiflexion 3+/5      Palpation   Palpation comment In standing:  Pt very tender to  palpation at PSIS bilaterally; tender to palpation at L greater trochanter; tender to palpation along bilateral IT band, mid-thigh and inferior.      Special  Tests   Other special tests Passive SLR stretch position:  LLE radicular pain at 32 degrees; RLE radicular pain at 30 degrees.      Transfers   Transfers Sit to Stand;Stand to Sit    Sit to Stand 5: Supervision;With upper extremity assist;From chair/3-in-1    Stand to Sit 5: Supervision;With upper extremity assist;To chair/3-in-1      Ambulation/Gait   Ambulation/Gait Yes    Ambulation Distance (Feet) 80 Feet    Assistive device Rolling walker    Gait Pattern Step-through pattern;Decreased step length - right;Decreased step length - left;Decreased stride length;Decreased hip/knee flexion - right;Decreased hip/knee flexion - left;Decreased dorsiflexion - right;Decreased dorsiflexion - left;Right foot flat;Left foot flat;Wide base of support    Ambulation Surface Level;Indoor    Gait velocity --   Slowed gait pattern, > 1 minute to complete 32.8 ft with RW            Pt tender to palpation at piriformis bilaterally; attempted supine piriformis stretch RLE, pt unable to tolerate.            Objective measurements completed on examination: See above findings.      Performed the following exercises in session today, with therapist assist and cues initially; pt reports improvement in tenderness at PSIS area and less pain. Access Code: [email or web address] URL: [email or web address] Date: 08/03/2020 Prepared by: Mady Haagensen  Exercises Supine Posterior Pelvic Tilt - 1-2 x daily - 7 x weekly - 1 sets - 10 reps Supine Anterior Pelvic Tilt - 1-2 x daily - 7 x weekly - 1 sets - 10 reps Lower Trunk Rotations - 1-2 x daily - 7 x weekly - 1 sets - 10 reps         PT Education - 08/03/20 2010     Education Details PT eval results, POC; initial HEP    Person(s) Educated Patient;Spouse    Methods  Explanation;Demonstration;Verbal cues;Handout    Comprehension Verbalized understanding;Returned demonstration              PT Short Term Goals - 08/03/20 2029       PT SHORT TERM GOAL #1   Title Pt will be independent with HEP for improved flexibility, strength, decreased pain, and gait.  TARGET 08/20/2020    Time 3    Period Weeks    Status New      PT SHORT TERM GOAL #2   Title Pt will report at least 3 means to decrease pain in low back and lower extremities.    Baseline reports 6/10 pain at eval    Time 3    Period Weeks    Status New      PT SHORT TERM GOAL #3   Title Pt will improve SLR test bilaterally by 5 degrees bilaterally before onset of pain.    Baseline LLE 32 degrees, RLE 30 degrees    Time 3    Period Weeks    Status New      PT SHORT TERM GOAL #4   Title Pt will perform at least 4 of 5 sit<>stand transfers with minimal to no UE support, indicating increased functional lower extremity strength.    Time 3    Period Weeks               PT Long Term Goals - 08/03/20 2034       PT LONG TERM GOAL #1   Title Pt will be independent with HEP for improved flexibility,strength, pain, transfers, and gait.  TARGET 09/10/2020    Time 6    Period Weeks    Status New      PT LONG TERM GOAL #2   Title Gait velocity to improve, with pt ambulating 32.8 ft in less than or equal to 45 seconds.    Baseline > 1 minute at eval    Time 6    Period Weeks    Status New      PT LONG TERM GOAL #3   Title Pt will ambulate at least 50-100 ft, supervision, with cane, for imrpoved household mobility.    Time 6    Period Weeks    Status New      PT LONG TERM GOAL #4   Title Further balance testing to be assessed, as it was unable to be completed at time of eval due to pt's sensitivity to pain with activities/time constraints                    Plan - 08/03/20 2014     Clinical Impression Statement Pt is a 55 year old female who presents to [facility] with  history of back pain, sensation changes, leg weakness that began around February 2022.  Prior to this time, she was independent with ambulation; she has progressed from use of cane to rolling walker.  During this time, pt had work up for cancer involving lymph nodes, which was negative.  She presented to ED 07/21/20 with lumbosacral radiculopathy, bilat lower extremity weakness, and potential neuropathy.   She presents to PT with decreased lower extremity strength, decreased independent transfers, decreased standing balance, decreased sensation, abnormality of gait, abnormal posture.  Of note, with resisted movements in LLE, she demonstrates jerky, ataxic type movements.  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  She would benefit from skilled PT to address the above stated deficits to decrease pain, improve functional mobility and independence.    Personal Factors and Comorbidities Comorbidity 3+    Comorbidities PMH:  chronic lumbar radiculopathy, RA, GERD, obesity, lymphadenopathy, 07/22/20:MRI showed mild progression of L recess/foraminal protrusion at L3-4    Examination-Activity Limitations Locomotion Level;Transfers;Stand    Examination-Participation Restrictions Occupation;Community Activity    Stability/Clinical Decision Making Evolving/Moderate complexity    Clinical Decision Making Moderate    Rehab Potential Good    PT Frequency 2x / week    PT Duration 6 weeks   including eval week   PT Treatment/Interventions ADLs/Self Care Home Management;Gait training;Electrical Stimulation;Cryotherapy;DME Instruction;Neuromuscular re-education;Balance training;Therapeutic exercise;Therapeutic activities;Functional mobility training;Moist Heat;Traction;Ultrasound;Patient/family education;Manual techniques;Passive range of motion    PT Next Visit Plan Review initial HEP; assess gait velocity (I got the measure and then didn't write it down from timer); Work on piriformis stretch if  able (may also need to do ice massage); single knee to chest, continued lumbar flexibility and potential traction to assist with pain; work towards strengthening and gait for improved functional mobility    PT Home Exercise Plan MedbridgeAccess Code: [email or web address]    Consulted and Agree with Plan of Care Patient;Family member/caregiver    Family Member Consulted husband             Patient will benefit from skilled therapeutic intervention in order to improve the following deficits and impairments:  Abnormal gait, Decreased balance, Decreased range of motion, Difficulty walking, Pain, Postural dysfunction, Decreased strength, Decreased mobility  Visit Diagnosis: Muscle weakness (generalized)  Abnormal posture  Chronic bilateral low back pain with bilateral sciatica  Other abnormalities  of gait and mobility     Problem List Patient Active Problem List   Diagnosis Date Noted   Impaired ambulation 07/21/2020   Lumbosacral radiculopathy    Weakness of both lower extremities    IDA (iron deficiency anemia) 05/24/2020   Symptomatic cholelithiasis 01/31/2011   ALLERGIC RHINITIS 07/02/2008   HEADACHE, CHRONIC 07/02/2008   DYSPNEA 07/02/2008   COUGH 07/02/2008   OBESITY 07/01/2008   SICKLE CELL TRAIT 07/01/2008   OBSESSIVE-COMPULSIVE DISORDER 07/01/2008   DEPRESSION 07/01/2008    Miranda Padilla W. 08/03/2020, 8:39 PM Frazier Butt., PT  [facility] [address] [place], [place], [address] Phone: [phone]   Fax:  [phone]  Name: Miranda Padilla MRN: [id number] Date of Birth: [birth year]

## 2020-08-04 ENCOUNTER — Inpatient Hospital Stay: Payer: 59

## 2020-08-04 VITALS — BP 124/75 | HR 82 | Temp 98.4°F | Resp 17

## 2020-08-04 DIAGNOSIS — D509 Iron deficiency anemia, unspecified: Secondary | ICD-10-CM

## 2020-08-04 MED ORDER — SODIUM CHLORIDE 0.9 % IV SOLN
125.0000 mg | Freq: Once | INTRAVENOUS | Status: AC
  Administered 2020-08-04: 125 mg via INTRAVENOUS
  Filled 2020-08-04: qty 125

## 2020-08-04 MED ORDER — SODIUM CHLORIDE 0.9 % IV SOLN
Freq: Once | INTRAVENOUS | Status: AC
  Filled 2020-08-04: qty 250

## 2020-08-04 NOTE — Patient Instructions (Signed)
Sodium Ferric Gluconate Complex injection What is this medication? SODIUM FERRIC GLUCONATE COMPLEX (SOE dee um FER ik GLOO koe nate KOM pleks) is an iron replacement. It is used with epoetin therapy to treat low iron levelsin patients who are receiving hemodialysis. This medicine may be used for other purposes; ask your health care provider orpharmacist if you have questions. COMMON BRAND NAME(S): Ferrlecit, Nulecit What should I tell my care team before I take this medication? They need to know if you have any of the following conditions: anemia that is not from iron deficiency high levels of iron in the body an unusual or allergic reaction to iron, benzyl alcohol, other medicines, foods, dyes, or preservatives pregnant or are trying to become pregnant breast-feeding How should I use this medication? This medicine is for infusion into a vein. It is given by a health careprofessional in a hospital or clinic setting. Talk to your pediatrician regarding the use of this medicine in children. While this drug may be prescribed for children as young as 6 years old for selectedconditions, precautions do apply. Overdosage: If you think you have taken too much of this medicine contact apoison control center or emergency room at once. NOTE: This medicine is only for you. Do not share this medicine with others. What if I miss a dose? It is important not to miss your dose. Call your doctor or health careprofessional if you are unable to keep an appointment. What may interact with this medication? Do not take this medicine with any of the following medications: deferoxamine dimercaprol other iron products This medicine may also interact with the following medications: chloramphenicol deferasirox medicine for blood pressure like enalapril This list may not describe all possible interactions. Give your health care provider a list of all the medicines, herbs, non-prescription drugs, or dietary  supplements you use. Also tell them if you smoke, drink alcohol, or use illegaldrugs. Some items may interact with your medicine. What should I watch for while using this medication? Your condition will be monitored carefully while you are receiving thismedicine. Visit your doctor for check-ups as directed. What side effects may I notice from receiving this medication? Side effects that you should report to your doctor or health care professionalas soon as possible: allergic reactions like skin rash, itching or hives, swelling of the face, lips, or tongue breathing problems changes in hearing changes in vision chills, flushing, or sweating fast, irregular heartbeat feeling faint or lightheaded, falls fever, flu-like symptoms high or low blood pressure pain, tingling, numbness in the hands or feet severe pain in the chest, back, flanks, or groin swelling of the ankles, feet, hands trouble passing urine or change in the amount of urine unusually weak or tired Side effects that usually do not require medical attention (report to yourdoctor or health care professional if they continue or are bothersome): cramps dark colored stools diarrhea headache nausea, vomiting stomach upset This list may not describe all possible side effects. Call your doctor for medical advice about side effects. You may report side effects to FDA at1-800-FDA-1088. Where should I keep my medication? This drug is given in a hospital or clinic and will not be stored at home. NOTE: This sheet is a summary. It may not cover all possible information. If you have questions about this medicine, talk to your doctor, pharmacist, orhealth care provider.  2022 Elsevier/Gold Standard (2007-09-11 15:58:57)  

## 2020-08-05 ENCOUNTER — Other Ambulatory Visit: Payer: Self-pay

## 2020-08-05 ENCOUNTER — Ambulatory Visit: Payer: 59 | Admitting: Physical Therapy

## 2020-08-05 DIAGNOSIS — R293 Abnormal posture: Secondary | ICD-10-CM

## 2020-08-05 DIAGNOSIS — R2689 Other abnormalities of gait and mobility: Secondary | ICD-10-CM

## 2020-08-05 DIAGNOSIS — M5442 Lumbago with sciatica, left side: Secondary | ICD-10-CM

## 2020-08-05 DIAGNOSIS — M6281 Muscle weakness (generalized): Secondary | ICD-10-CM | POA: Diagnosis not present

## 2020-08-05 DIAGNOSIS — G8929 Other chronic pain: Secondary | ICD-10-CM

## 2020-08-05 NOTE — Therapy (Signed)
[facility] [address] [place], [place], [address] Phone: [phone]   Fax:  [phone]  Physical Therapy Treatment  Patient Details  Name: Miranda Padilla MRN: [id number] Date of Birth: April 06, 1965 Referring Provider (PT): (Dr. Corey Harold   Encounter Date: 08/05/2020   PT End of Session - 08/05/20 1112     Visit Number 2    Number of Visits 13    Date for PT Re-Evaluation 09/10/20    Authorization Type UHC    PT Start Time 1749    PT Stop Time 1100    PT Time Calculation (min) 45 min    Activity Tolerance Patient tolerated treatment well   Pt reports feeling better after initial lumbar flexibility exercises   Behavior During Therapy WFL for tasks assessed/performed             Past Medical History:  Diagnosis Date   Abdominal distension    Abdominal pain    Anemia    Anxiety    Arthritis    Asthma    Bronchitis    Constipation    Cough    Depression    Family history of adverse reaction to anesthesia    Pt mother would wake up during procedures   GERD (gastroesophageal reflux disease)    Headache(784.0)    Nausea & vomiting    Obsessive compulsive disorder    not on any medication at this time   Sickle cell trait ([place])    Thalassemia     Past Surgical History:  Procedure Laterality Date   AXILLARY LYMPH NODE BIOPSY Right 07/01/2020   Procedure: EXCISIONAL BIOPSY RIGHT AXILLARY LYMPH NODE;  Surgeon: Coralie Keens, MD;  Location: [facility];  Service: General;  Laterality: Right;   CHOLECYSTECTOMY  02/03/2011   Procedure: LAPAROSCOPIC CHOLECYSTECTOMY;  Surgeon: Harl Bowie, MD;  Location: [facility];  Service: General;  Laterality: N/A;  Laparoscopic Choleystectomy   COLONOSCOPY WITH PROPOFOL N/A 06/01/2016   Procedure: COLONOSCOPY WITH PROPOFOL;  Surgeon: Ronnette Juniper, MD;  Location: [facility];  Service: Gastroenterology;  Laterality: N/A;   MOUTH SURGERY  2000     There were no vitals filed for this visit.   Subjective Assessment - 08/05/20 1018     Subjective Pt reports that immediately after performing her exercises she's been getting some differences in her sensation. Pt describes it as a "wave" like it's when your arms go to sleep and starts waking up. Pt reports feeling that her leg feels like it spasms.    Pertinent History Had been going thorugh assessment of lymph nodes to r/o cancer (no cancer per pt report); NCV normal    Limitations Standing;Walking;House hold activities    Diagnostic tests MRI mild progression of L recess/foraminal protrusion of L3-4    Patient Stated Goals Pt's goals are to get back to walking independently.    Currently in Pain? Yes    Pain Location Foot    Pain Orientation Right;Left    Pain Descriptors / Indicators Tingling    Pain Type Acute pain    Pain Onset 1 to 4 weeks ago    Pain Onset 1 to 4 weeks ago                               [facility] Adult PT Treatment/Exercise - 08/05/20 0001       Exercises   Exercises Lumbar  Lumbar Exercises: Stretches   Passive Hamstring Stretch Right;Left;20 seconds    Passive Hamstring Stretch Limitations nerve flossing on R (able to obtain 80 deg SLR);    Figure 4 Stretch 2 reps;10 seconds    Gastroc Stretch 2 reps;20 seconds;Right;Left      Lumbar Exercises: Supine   Pelvic Tilt 5 reps;5 seconds    Clam 10 reps    Clam Limitations with PPT    Heel Slides 10 reps    Heel Slides Limitations AAROM on L    Bridge 10 reps;Compliant    Straight Leg Raise 10 reps    Large Ball Oblique Isometric Limitations --    Other Supine Lumbar Exercises PPT with pillow squeeze 10x3"      Manual Therapy   Manual Therapy Soft tissue mobilization    Soft tissue mobilization Gentle TPR and STM of bilat piriformis, hamstring, glutes (L more symptomatic than R)                      PT Short Term Goals - 08/03/20 2029       PT SHORT TERM GOAL  #1   Title Pt will be independent with HEP for improved flexibility, strength, decreased pain, and gait.  TARGET 08/20/2020    Time 3    Period Weeks    Status New      PT SHORT TERM GOAL #2   Title Pt will report at least 3 means to decrease pain in low back and lower extremities.    Baseline reports 6/10 pain at eval    Time 3    Period Weeks    Status New      PT SHORT TERM GOAL #3   Title Pt will improve SLR test bilaterally by 5 degrees bilaterally before onset of pain.    Baseline LLE 32 degrees, RLE 30 degrees    Time 3    Period Weeks    Status New      PT SHORT TERM GOAL #4   Title Pt will perform at least 4 of 5 sit<>stand transfers with minimal to no UE support, indicating increased functional lower extremity strength.    Time 3    Period Weeks               PT Long Term Goals - 08/03/20 2034       PT LONG TERM GOAL #1   Title Pt will be independent with HEP for improved flexibility,strength, pain, transfers, and gait.  TARGET 09/10/2020    Time 6    Period Weeks    Status New      PT LONG TERM GOAL #2   Title Gait velocity to improve, with pt ambulating 32.8 ft in less than or equal to 45 seconds.    Baseline > 1 minute at eval    Time 6    Period Weeks    Status New      PT LONG TERM GOAL #3   Title Pt will ambulate at least 50-100 ft, supervision, with cane, for imrpoved household mobility.    Time 6    Period Weeks    Status New      PT LONG TERM GOAL #4   Title Further balance testing to be assessed, as it was unable to be completed at time of eval due to pt's sensitivity to pain with activities/time constraints  Plan - 08/05/20 1104     Clinical Impression Statement Focused on adding LE stretching/nerve gliding to try and address pt's LE spasms after performing pelvic tilts. Pt very much feels better during PPT. Initiated hip/knee strengthening exercises this session. Pt able to tolerate manual therapy this session.     Personal Factors and Comorbidities Comorbidity 3+    Comorbidities PMH:  chronic lumbar radiculopathy, RA, GERD, obesity, lymphadenopathy, 07/22/20:MRI showed mild progression of L recess/foraminal protrusion at L3-4    Examination-Activity Limitations Locomotion Level;Transfers;Stand    Examination-Participation Restrictions Occupation;Community Activity    Stability/Clinical Decision Making Evolving/Moderate complexity    Rehab Potential Good    PT Frequency 2x / week    PT Duration 6 weeks   including eval week   PT Treatment/Interventions ADLs/Self Care Home Management;Gait training;Electrical Stimulation;Cryotherapy;DME Instruction;Neuromuscular re-education;Balance training;Therapeutic exercise;Therapeutic activities;Functional mobility training;Moist Heat;Traction;Ultrasound;Patient/family education;Manual techniques;Passive range of motion    PT Next Visit Plan Review and modify HEP as needed. Continue piriformis, hamstring, gastroc stretching. Continue to strengthen hips/core. Continue nerve flossing/gliding, lumbar flexibility and potential traction to assist with pain. Work towards strengthening and gait for improved functional mobility    PT [place] Code: [id number]    Consulted and Agree with Plan of Care Patient;Family member/caregiver    Family Member Consulted husband             Patient will benefit from skilled therapeutic intervention in order to improve the following deficits and impairments:  Abnormal gait, Decreased balance, Decreased range of motion, Difficulty walking, Pain, Postural dysfunction, Decreased strength, Decreased mobility  Visit Diagnosis: Muscle weakness (generalized)  Abnormal posture  Chronic bilateral low back pain with bilateral sciatica  Other abnormalities of gait and mobility     Problem List Patient Active Problem List   Diagnosis Date Noted   Impaired ambulation 07/21/2020   Lumbosacral radiculopathy     Weakness of both lower extremities    IDA (iron deficiency anemia) 05/24/2020   Symptomatic cholelithiasis 01/31/2011   ALLERGIC RHINITIS 07/02/2008   HEADACHE, CHRONIC 07/02/2008   DYSPNEA 07/02/2008   COUGH 07/02/2008   OBESITY 07/01/2008   SICKLE CELL TRAIT 07/01/2008   OBSESSIVE-COMPULSIVE DISORDER 07/01/2008   DEPRESSION 07/01/2008    Luiza Carranco April Ma L Ashawn Rinehart PT, DPT 08/05/2020, 12:24 PM  [facility] [address] [place], [place], [address] Phone: [phone]   Fax:  [phone]  Name: Miranda Padilla MRN: [id number] Date of Birth: 02/23/1965

## 2020-08-09 ENCOUNTER — Other Ambulatory Visit: Payer: Self-pay

## 2020-08-09 ENCOUNTER — Ambulatory Visit: Payer: 59 | Admitting: Physical Therapy

## 2020-08-09 DIAGNOSIS — M6281 Muscle weakness (generalized): Secondary | ICD-10-CM | POA: Diagnosis not present

## 2020-08-09 DIAGNOSIS — R2689 Other abnormalities of gait and mobility: Secondary | ICD-10-CM

## 2020-08-09 DIAGNOSIS — G8929 Other chronic pain: Secondary | ICD-10-CM

## 2020-08-09 DIAGNOSIS — R293 Abnormal posture: Secondary | ICD-10-CM

## 2020-08-09 NOTE — Therapy (Signed)
[facility] [address] [place], [place], [address] Phone: [phone]   Fax:  [phone]  Physical Therapy Treatment  Patient Details  Name: Miranda Padilla MRN: [id number] Date of Birth: 1965-07-15 Referring Provider (PT): (Dr. Corey Harold   Encounter Date: 08/09/2020   PT End of Session - 08/09/20 1016     Visit Number 3    Number of Visits 13    Date for PT Re-Evaluation 09/10/20    Authorization Type UHC    PT Start Time 6967    PT Stop Time 1018    PT Time Calculation (min) 43 min    Activity Tolerance Patient tolerated treatment well   Pt reports feeling better after initial lumbar flexibility exercises   Behavior During Therapy WFL for tasks assessed/performed             Past Medical History:  Diagnosis Date   Abdominal distension    Abdominal pain    Anemia    Anxiety    Arthritis    Asthma    Bronchitis    Constipation    Cough    Depression    Family history of adverse reaction to anesthesia    Pt mother would wake up during procedures   GERD (gastroesophageal reflux disease)    Headache(784.0)    Nausea & vomiting    Obsessive compulsive disorder    not on any medication at this time   Sickle cell trait ([place])    Thalassemia     Past Surgical History:  Procedure Laterality Date   AXILLARY LYMPH NODE BIOPSY Right 07/01/2020   Procedure: EXCISIONAL BIOPSY RIGHT AXILLARY LYMPH NODE;  Surgeon: Coralie Keens, MD;  Location: [facility];  Service: General;  Laterality: Right;   CHOLECYSTECTOMY  02/03/2011   Procedure: LAPAROSCOPIC CHOLECYSTECTOMY;  Surgeon: Harl Bowie, MD;  Location: [facility];  Service: General;  Laterality: N/A;  Laparoscopic Choleystectomy   COLONOSCOPY WITH PROPOFOL N/A 06/01/2016   Procedure: COLONOSCOPY WITH PROPOFOL;  Surgeon: Ronnette Juniper, MD;  Location: [facility];  Service: Gastroenterology;  Laterality: N/A;   MOUTH SURGERY  2000     There were no vitals filed for this visit.   Subjective Assessment - 08/09/20 1255     Subjective Pt reports she can feel her sensation is coming back but it's not comfortable. She can feel the balls of her feet better. She notes that the middle two toes feel the most uncomfortable/burning. Pt states she is having a hard time sleeping due to pain/discomfort. Pt reports using lidocaine. Pt notes that the right le is starting to feel more normal. The left still feels like it's spasming. Pt notes continued bilat ankle/foot stiffness.    Pertinent History Had been going thorugh assessment of lymph nodes to r/o cancer (no cancer per pt report); NCV normal    Limitations Standing;Walking;House hold activities    Diagnostic tests MRI mild progression of L recess/foraminal protrusion of L3-4    Patient Stated Goals Pt's goals are to get back to walking independently.    Pain Onset 1 to 4 weeks ago    Pain Onset 1 to 4 weeks ago                 Manual therapy:  Gastroc/soleus stretch PROM x30 sec  Plantarflexion with inversion PROM x30 sec  Toe flexion stretches x30 sec bilat  Myofascial release, TPR and soft tissue mobilization bilat gastric/soleus   Ankle pump 2x10 bilat Gastroc stretch  2x20 sec bilat standing Soleus stretch 2x20 sec bilat standing 4-way ankle red tband 2x10 seated            PT Education - 08/09/20 1257     Education Details Discussed alternating her exercises for core/legs one day and then ankle/foot on another day.    Person(s) Educated Patient;Spouse    Methods Explanation;Demonstration;Verbal cues;Handout    Comprehension Verbalized understanding;Returned demonstration              PT Short Term Goals - 08/03/20 2029       PT SHORT TERM GOAL #1   Title Pt will be independent with HEP for improved flexibility, strength, decreased pain, and gait.  TARGET 08/20/2020    Time 3    Period Weeks    Status New      PT SHORT TERM GOAL #2    Title Pt will report at least 3 means to decrease pain in low back and lower extremities.    Baseline reports 6/10 pain at eval    Time 3    Period Weeks    Status New      PT SHORT TERM GOAL #3   Title Pt will improve SLR test bilaterally by 5 degrees bilaterally before onset of pain.    Baseline LLE 32 degrees, RLE 30 degrees    Time 3    Period Weeks    Status New      PT SHORT TERM GOAL #4   Title Pt will perform at least 4 of 5 sit<>stand transfers with minimal to no UE support, indicating increased functional lower extremity strength.    Time 3    Period Weeks               PT Long Term Goals - 08/03/20 2034       PT LONG TERM GOAL #1   Title Pt will be independent with HEP for improved flexibility,strength, pain, transfers, and gait.  TARGET 09/10/2020    Time 6    Period Weeks    Status New      PT LONG TERM GOAL #2   Title Gait velocity to improve, with pt ambulating 32.8 ft in less than or equal to 45 seconds.    Baseline > 1 minute at eval    Time 6    Period Weeks    Status New      PT LONG TERM GOAL #3   Title Pt will ambulate at least 50-100 ft, supervision, with cane, for imrpoved household mobility.    Time 6    Period Weeks    Status New      PT LONG TERM GOAL #4   Title Further balance testing to be assessed, as it was unable to be completed at time of eval due to pt's sensitivity to pain with activities/time constraints                   Plan - 08/09/20 1015     Clinical Impression Statement Treatment focused on improving pt's ankle tightness and spasm. Pt very tight along distal calves bilaterally. Manual therapy provided to address this as well as new ankle exercises. Pt reports greatest discomfort with dorsiflexion.    Personal Factors and Comorbidities Comorbidity 3+    Comorbidities PMH:  chronic lumbar radiculopathy, RA, GERD, obesity, lymphadenopathy, 07/22/20:MRI showed mild progression of L recess/foraminal protrusion at L3-4     Examination-Activity Limitations Locomotion Level;Transfers;Stand    Examination-Participation Restrictions Occupation;Community Activity  Stability/Clinical Decision Making Evolving/Moderate complexity    Rehab Potential Good    PT Frequency 2x / week    PT Duration 6 weeks   including eval week   PT Treatment/Interventions ADLs/Self Care Home Management;Gait training;Electrical Stimulation;Cryotherapy;DME Instruction;Neuromuscular re-education;Balance training;Therapeutic exercise;Therapeutic activities;Functional mobility training;Moist Heat;Traction;Ultrasound;Patient/family education;Manual techniques;Passive range of motion    PT Next Visit Plan Review and modify HEP as needed. Continue piriformis, hamstring, gastroc stretching. Continue to strengthen hips/core. Continue nerve flossing/gliding, lumbar flexibility and potential traction to assist with pain. Work towards strengthening and gait for improved functional mobility    PT [place] Code: [email or web address]    Consulted and Agree with Plan of Care Patient;Family member/caregiver    Family Member Consulted husband             Patient will benefit from skilled therapeutic intervention in order to improve the following deficits and impairments:  Abnormal gait, Decreased balance, Decreased range of motion, Difficulty walking, Pain, Postural dysfunction, Decreased strength, Decreased mobility  Visit Diagnosis: Muscle weakness (generalized)  Abnormal posture  Chronic bilateral low back pain with bilateral sciatica  Other abnormalities of gait and mobility     Problem List Patient Active Problem List   Diagnosis Date Noted   Impaired ambulation 07/21/2020   Lumbosacral radiculopathy    Weakness of both lower extremities    IDA (iron deficiency anemia) 05/24/2020   Symptomatic cholelithiasis 01/31/2011   ALLERGIC RHINITIS 07/02/2008   HEADACHE, CHRONIC 07/02/2008   DYSPNEA 07/02/2008   COUGH  07/02/2008   OBESITY 07/01/2008   SICKLE CELL TRAIT 07/01/2008   OBSESSIVE-COMPULSIVE DISORDER 07/01/2008   DEPRESSION 07/01/2008    Dallan Schonberg April Ma L Jahni Paul PT, DPT 08/09/2020, 12:59 PM  [facility] [address] [place], [place], [address] Phone: [phone]   Fax:  [phone]  Name: Ramanda Paules MRN: [id number] Date of Birth: 1965-12-01

## 2020-08-11 ENCOUNTER — Other Ambulatory Visit: Payer: Self-pay

## 2020-08-11 ENCOUNTER — Ambulatory Visit: Payer: 59 | Admitting: Physical Therapy

## 2020-08-11 DIAGNOSIS — G8929 Other chronic pain: Secondary | ICD-10-CM

## 2020-08-11 DIAGNOSIS — R293 Abnormal posture: Secondary | ICD-10-CM

## 2020-08-11 DIAGNOSIS — R2689 Other abnormalities of gait and mobility: Secondary | ICD-10-CM

## 2020-08-11 DIAGNOSIS — M5442 Lumbago with sciatica, left side: Secondary | ICD-10-CM

## 2020-08-11 DIAGNOSIS — M6281 Muscle weakness (generalized): Secondary | ICD-10-CM

## 2020-08-11 NOTE — Therapy (Signed)
[facility] [address] [place], [place], [address] Phone: [phone]   Fax:  [phone]  Physical Therapy Treatment  Patient Details  Name: Miranda Padilla MRN: [id number] Date of Birth: 09/28/1965 Referring Provider (PT): (Dr. Corey Harold   Encounter Date: 08/11/2020   PT End of Session - 08/11/20 0930     Visit Number 4    Number of Visits 13    Date for PT Re-Evaluation 09/10/20    Authorization Type UHC    PT Start Time 0930    PT Stop Time 1015    PT Time Calculation (min) 45 min    Activity Tolerance Patient tolerated treatment well   Pt reports feeling better after initial lumbar flexibility exercises   Behavior During Therapy WFL for tasks assessed/performed             Past Medical History:  Diagnosis Date   Abdominal distension    Abdominal pain    Anemia    Anxiety    Arthritis    Asthma    Bronchitis    Constipation    Cough    Depression    Family history of adverse reaction to anesthesia    Pt mother would wake up during procedures   GERD (gastroesophageal reflux disease)    Headache(784.0)    Nausea & vomiting    Obsessive compulsive disorder    not on any medication at this time   Sickle cell trait ([place])    Thalassemia     Past Surgical History:  Procedure Laterality Date   AXILLARY LYMPH NODE BIOPSY Right 07/01/2020   Procedure: EXCISIONAL BIOPSY RIGHT AXILLARY LYMPH NODE;  Surgeon: Coralie Keens, MD;  Location: [facility];  Service: General;  Laterality: Right;   CHOLECYSTECTOMY  02/03/2011   Procedure: LAPAROSCOPIC CHOLECYSTECTOMY;  Surgeon: Harl Bowie, MD;  Location: [facility];  Service: General;  Laterality: N/A;  Laparoscopic Choleystectomy   COLONOSCOPY WITH PROPOFOL N/A 06/01/2016   Procedure: COLONOSCOPY WITH PROPOFOL;  Surgeon: Ronnette Juniper, MD;  Location: [facility];  Service: Gastroenterology;  Laterality: N/A;   MOUTH SURGERY  2000     There were no vitals filed for this visit.   Subjective Assessment - 08/11/20 0932     Subjective Pt reports her ankles have stayed loosened but her calves are still a little tight. Pt reports that the discomfort/pain is the hardest part to deal.    Pertinent History Had been going thorugh assessment of lymph nodes to r/o cancer (no cancer per pt report); NCV normal    Limitations Standing;Walking;House hold activities    Diagnostic tests MRI mild progression of L recess/foraminal protrusion of L3-4    Patient Stated Goals Pt's goals are to get back to walking independently.    Currently in Pain? Yes    Pain Score 10-Worst pain ever    Pain Onset 1 to 4 weeks ago    Pain Onset 1 to 4 weeks ago                               [facility] Adult PT Treatment/Exercise - 08/11/20 0001       Lumbar Exercises: Stretches   Passive Hamstring Stretch Right;Left;20 seconds      Lumbar Exercises: Supine   Pelvic Tilt 10 reps    Pelvic Tilt Limitations x2 with marching    Heel Slides 5 reps    Heel Slides Limitations full  AROM on L with no jerky movements    Bridge 5 reps;Compliant    Straight Leg Raise 20 reps   R&L >60 deg   Other Supine Lumbar Exercises Ankle PF+ inv x10, PF + ev x10; DF +inv x10, DF + ev x10; toe flexion/ext x10      Lumbar Exercises: Sidelying   Clam 10 reps;Right;Left    Clam Limitations x2 sets red tband      Manual Therapy   Manual therapy comments PROM and PNF ankle DF and PF    Soft tissue mobilization TPR and STM bilat ankle, calves, ant tib                      PT Short Term Goals - 08/03/20 2029       PT SHORT TERM GOAL #1   Title Pt will be independent with HEP for improved flexibility, strength, decreased pain, and gait.  TARGET 08/20/2020    Time 3    Period Weeks    Status New      PT SHORT TERM GOAL #2   Title Pt will report at least 3 means to decrease pain in low back and lower extremities.    Baseline reports  6/10 pain at eval    Time 3    Period Weeks    Status New      PT SHORT TERM GOAL #3   Title Pt will improve SLR test bilaterally by 5 degrees bilaterally before onset of pain.    Baseline LLE 32 degrees, RLE 30 degrees    Time 3    Period Weeks    Status New      PT SHORT TERM GOAL #4   Title Pt will perform at least 4 of 5 sit<>stand transfers with minimal to no UE support, indicating increased functional lower extremity strength.    Time 3    Period Weeks               PT Long Term Goals - 08/03/20 2034       PT LONG TERM GOAL #1   Title Pt will be independent with HEP for improved flexibility,strength, pain, transfers, and gait.  TARGET 09/10/2020    Time 6    Period Weeks    Status New      PT LONG TERM GOAL #2   Title Gait velocity to improve, with pt ambulating 32.8 ft in less than or equal to 45 seconds.    Baseline > 1 minute at eval    Time 6    Period Weeks    Status New      PT LONG TERM GOAL #3   Title Pt will ambulate at least 50-100 ft, supervision, with cane, for imrpoved household mobility.    Time 6    Period Weeks    Status New      PT LONG TERM GOAL #4   Title Further balance testing to be assessed, as it was unable to be completed at time of eval due to pt's sensitivity to pain with activities/time constraints                   Plan - 08/11/20 1007     Clinical Impression Statement Treatment session focused on continuing to stretch calves/ankles. Worked on progressing pt's strengthening and updating her core/hip exercises. Pt tolerated well. R ankle tighter than L ankle; R LE continues to have higher strength. Pt now able to perform heel  slide without jerky movements. Pain decreased to 6 or 7 by end of session.    Personal Factors and Comorbidities Comorbidity 3+    Comorbidities PMH:  chronic lumbar radiculopathy, RA, GERD, obesity, lymphadenopathy, 07/22/20:MRI showed mild progression of L recess/foraminal protrusion at L3-4     Examination-Activity Limitations Locomotion Level;Transfers;Stand    Examination-Participation Restrictions Occupation;Community Activity    Stability/Clinical Decision Making Evolving/Moderate complexity    Rehab Potential Good    PT Frequency 2x / week    PT Duration 6 weeks   including eval week   PT Treatment/Interventions ADLs/Self Care Home Management;Gait training;Electrical Stimulation;Cryotherapy;DME Instruction;Neuromuscular re-education;Balance training;Therapeutic exercise;Therapeutic activities;Functional mobility training;Moist Heat;Traction;Ultrasound;Patient/family education;Manual techniques;Passive range of motion    PT Next Visit Plan Review and modify HEP as needed. Continue piriformis, hamstring, gastroc stretching. Continue to strengthen hips/core. Continue nerve flossing/gliding, lumbar flexibility and potential traction to assist with pain. Trial standing exercises/stability; walking in // bars with no UE support.    PT Home Exercise Plan [employer]bridge[employer] Code: [email or web address]    Consulted and Agree with Plan of Care Patient;Family member/caregiver    Family Member Consulted husband             Patient will benefit from skilled therapeutic intervention in order to improve the following deficits and impairments:  Abnormal gait, Decreased balance, Decreased range of motion, Difficulty walking, Pain, Postural dysfunction, Decreased strength, Decreased mobility  Visit Diagnosis: Muscle weakness (generalized)  Abnormal posture  Chronic bilateral low back pain with bilateral sciatica  Other abnormalities of gait and mobility     Problem List Patient Active Problem List   Diagnosis Date Noted   Impaired ambulation 07/21/2020   Lumbosacral radiculopathy    Weakness of both lower extremities    IDA (iron deficiency anemia) 05/24/2020   Symptomatic cholelithiasis 01/31/2011   ALLERGIC RHINITIS 07/02/2008   HEADACHE, CHRONIC 07/02/2008   DYSPNEA 07/02/2008   COUGH  07/02/2008   OBESITY 07/01/2008   SICKLE CELL TRAIT 07/01/2008   OBSESSIVE-COMPULSIVE DISORDER 07/01/2008   DEPRESSION 07/01/2008    Miranda Padilla PT, DPT 08/11/2020, 10:18 AM  [facility] [address] [place], [place], [address] Phone: [phone]   Fax:  [phone]  Name: Miranda Padilla MRN: [id number] Date of Birth: [birth year]

## 2020-08-16 ENCOUNTER — Ambulatory Visit: Payer: 59 | Admitting: Physical Therapy

## 2020-08-18 ENCOUNTER — Other Ambulatory Visit: Payer: Self-pay

## 2020-08-18 ENCOUNTER — Ambulatory Visit: Payer: 59 | Admitting: Physical Therapy

## 2020-08-18 DIAGNOSIS — R293 Abnormal posture: Secondary | ICD-10-CM

## 2020-08-18 DIAGNOSIS — M5442 Lumbago with sciatica, left side: Secondary | ICD-10-CM

## 2020-08-18 DIAGNOSIS — M6281 Muscle weakness (generalized): Secondary | ICD-10-CM | POA: Diagnosis not present

## 2020-08-18 DIAGNOSIS — G8929 Other chronic pain: Secondary | ICD-10-CM

## 2020-08-18 NOTE — Therapy (Signed)
[place] [address] [place], [place], [address] Phone: [phone]   Fax:  [phone]  Physical Therapy Treatment  Patient Details  Name: Miranda Padilla MRN: [id number] Date of Birth: 1965-07-24 Referring Provider (PT): (Dr. Corey Harold   Encounter Date: 08/18/2020   PT End of Session - 08/18/20 0847     Visit Number 5    Number of Visits 13    Date for PT Re-Evaluation 09/10/20    Authorization Type UHC    PT Start Time 0847    PT Stop Time 0932    PT Time Calculation (min) 45 min    Activity Tolerance Patient tolerated treatment well   reports less pain at end of session   Behavior During Therapy [facility] for tasks assessed/performed             Past Medical History:  Diagnosis Date   Abdominal distension    Abdominal pain    Anemia    Anxiety    Arthritis    Asthma    Bronchitis    Constipation    Cough    Depression    Family history of adverse reaction to anesthesia    Pt mother would wake up during procedures   GERD (gastroesophageal reflux disease)    Headache(784.0)    Nausea & vomiting    Obsessive compulsive disorder    not on any medication at this time   Sickle cell trait ([place])    Thalassemia     Past Surgical History:  Procedure Laterality Date   AXILLARY LYMPH NODE BIOPSY Right 07/01/2020   Procedure: EXCISIONAL BIOPSY RIGHT AXILLARY LYMPH NODE;  Surgeon: Coralie Keens, MD;  Location: [place];  Service: General;  Laterality: Right;   CHOLECYSTECTOMY  02/03/2011   Procedure: LAPAROSCOPIC CHOLECYSTECTOMY;  Surgeon: Harl Bowie, MD;  Location: [place];  Service: General;  Laterality: N/A;  Laparoscopic Choleystectomy   COLONOSCOPY WITH PROPOFOL N/A 06/01/2016   Procedure: COLONOSCOPY WITH PROPOFOL;  Surgeon: Ronnette Juniper, MD;  Location: [place];  Service: Gastroenterology;  Laterality: N/A;   MOUTH SURGERY  2000    There were no vitals filed for  this visit.   Subjective Assessment - 08/18/20 0847     Subjective Pt reports moving her ankles more and more able to strike heels with walking.  Still feel uncomfortable in lower legs, likely prickly.  Feel like I'm getting more spasms-tightness in feet.  Overall feels pain is better.  Before, everything hurt all the time; now it's more reactionary. Back feels better    Pertinent History Had been going thorugh assessment of lymph nodes to r/o cancer (no cancer per pt report); NCV normal    Limitations Standing;Walking;House hold activities    Diagnostic tests MRI mild progression of L recess/foraminal protrusion of L3-4    Patient Stated Goals Pt's goals are to get back to walking independently.    Currently in Pain? Yes    Pain Score 6     Pain Location Foot   and toes   Pain Orientation Right;Left    Pain Descriptors / Indicators Tingling;Pins and needles    Pain Type Acute pain    Pain Onset 1 to 4 weeks ago    Pain Frequency Constant    Aggravating Factors  certain movements    Pain Relieving Factors gabapentin/cymbalta ,    Multiple Pain Sites Yes    Pain Score 7    Pain Location Back   into buttocks  Pain Orientation Right;Left    Pain Type Acute pain    Pain Onset 1 to 4 weeks ago    Pain Frequency Constant    Aggravating Factors  certain movement    Pain Relieving Factors medication                               [facility] Adult PT Treatment/Exercise - 08/18/20 0001       Transfers   Transfers Sit to Stand;Stand to Sit    Sit to Stand 5: Supervision;With upper extremity assist;4: Min guard;From bed    Sit to Stand Details Tactile cues for sequencing;Tactile cues for weight shifting;Tactile cues for placement;Verbal cues for sequencing;Verbal cues for technique;Visual cues/gestures for sequencing    Sit to Stand Details (indicate cue type and reason) Cues to scoot at edge of mat, cues for foot placmeent, forward lean and use of UEs to boost up to stand     Stand to Sit 5: Supervision;With upper extremity assist;To bed    Number of Reps 1 set;Other reps (comment)   5 reps   Comments Pt improves with repetition      Lumbar Exercises: Supine   Heel Slides 10 reps    Heel Slides Limitations full AROM on L with no jerky movements              Access Code: [email or web address] URL: [email or web address] Date: 08/18/2020 Prepared by: Mady Haagensen  Exercises-Reviewed full HEP, with pt demo understanding.  Reiterated with stretches, that these motions should be gentle and not painful.  Supine Figure 4 Piriformis Stretch - 1 x daily - 7 x weekly - 3 sets - 10 sec hold Supine Active Straight Leg Raise - 1 x daily - 7 x weekly - 2 sets - 10 reps Supine Sciatic Nerve Glide - 1 x daily - 7 x weekly - 1 sets - 10 reps Supine March with Posterior Pelvic Tilt - 1 x daily - 7 x weekly - 2 sets - 10 reps Clamshell with Resistance - 1 x daily - 7 x weekly - 2 sets - 10 reps Supine Bridge - 1 x daily - 7 x weekly - 1 sets - 10 reps Standing Gastroc Stretch on Step - 1 x daily - 7 x weekly - 2 sets - 20 sec hold Standing Soleus Stretch on Step - 1 x daily - 7 x weekly - 2 sets - 20 sec hold Seated Ankle Plantar Flexion with Resistance Loop - 1 x daily - 7 x weekly - 2 sets - 10 reps (Cues provided for propping foot on floor to avoid holding leg in air with too much of a stretch along posterior leg)  Pt reports this feels better. CLX Ankle Dorsiflexion and Eversion - 1 x daily - 7 x weekly - 2 sets - 10 reps Seated Ankle Inversion with Anchored Resistance - 1 x daily - 7 x weekly - 3 sets - 10 reps Seated Ankle Inversion Eversion PROM - 1 x daily - 7 x weekly - 2 sets - 30 sec hold        PT Education - 08/18/20 1206     Education Details Progress towards goals, POC, sit to stand transfer technique    Person(s) Educated Patient;Spouse    Methods Explanation;Demonstration;Verbal cues    Comprehension Verbalized understanding;Returned  demonstration              PT Short Term Goals - 08/18/20  0856       PT SHORT TERM GOAL #1   Title Pt will be independent with HEP for improved flexibility, strength, decreased pain, and gait.  TARGET 08/20/2020    Time 3    Period Weeks    Status Achieved      PT SHORT TERM GOAL #2   Title Pt will report at least 3 means to decrease pain in low back and lower extremities.    Baseline reports 6/10 pain at eval; pt reports positioning, heat, some gentle exercises    Time 3    Period Weeks    Status Achieved      PT SHORT TERM GOAL #3   Title Pt will improve SLR test bilaterally by 5 degrees bilaterally before onset of pain.    Baseline LLE 32 degrees, RLE 30 degrees    Time 3    Period Weeks    Status New      PT SHORT TERM GOAL #4   Title Pt will perform at least 4 of 5 sit<>stand transfers with minimal to no UE support, indicating increased functional lower extremity strength.    Baseline still requires UE support, receptive to performing with technique to lessen UE support 08/18/2020    Time 3    Period Weeks    Status Not Met               PT Long Term Goals - 08/03/20 2034       PT LONG TERM GOAL #1   Title Pt will be independent with HEP for improved flexibility,strength, pain, transfers, and gait.  TARGET 09/10/2020    Time 6    Period Weeks    Status New      PT LONG TERM GOAL #2   Title Gait velocity to improve, with pt ambulating 32.8 ft in less than or equal to 45 seconds.    Baseline > 1 minute at eval    Time 6    Period Weeks    Status New      PT LONG TERM GOAL #3   Title Pt will ambulate at least 50-100 ft, supervision, with cane, for imrpoved household mobility.    Time 6    Period Weeks    Status New      PT LONG TERM GOAL #4   Title Further balance testing to be assessed, as it was unable to be completed at time of eval due to pt's sensitivity to pain with activities/time constraints                   Plan - 08/18/20 1208      Clinical Impression Statement Began assessing STGs this visit, with pt meeting 2 of 4 STGs.  Pt has met STG 1 and STG 2.  STG 3 not assessed today-to be assessed next visit.  STG 4 not met, but pt improves with repetition of initial practice with repeated sit<>Stand today.  She continues to demonstrate and report overall less pain in back and feet, able to increase heelslides to 10 reps LLE no jerky motions.  She continues to have limitations in flexibility with ankle motion and will continue to benefit from skilled PT to further address pain, lumbar and hip/core stability, flexibility for improved overall mobility.    Personal Factors and Comorbidities Comorbidity 3+    Comorbidities PMH:  chronic lumbar radiculopathy, RA, GERD, obesity, lymphadenopathy, 07/22/20:MRI showed mild progression of L recess/foraminal protrusion at L3-4    Examination-Activity  Limitations Locomotion Level;Transfers;Stand    Examination-Participation Restrictions Occupation;Community Activity    Stability/Clinical Decision Making Evolving/Moderate complexity    Rehab Potential Good    PT Frequency 2x / week    PT Duration 6 weeks   including eval week   PT Treatment/Interventions ADLs/Self Care Home Management;Gait training;Electrical Stimulation;Cryotherapy;DME Instruction;Neuromuscular re-education;Balance training;Therapeutic exercise;Therapeutic activities;Functional mobility training;Moist Heat;Traction;Ultrasound;Patient/family education;Manual techniques;Passive range of motion    PT Next Visit Plan Review and modify HEP as needed. Continue piriformis, hamstring, gastroc stretching (maybe more dynamic in standing?); review sit to stand technique.  Continue to strengthen hips/core. Continue nerve flossing/gliding, lumbar flexibility and potential traction to assist with pain. Trial standing exercises/stability; walking in // bars with no UE support.  Pt has one more week in POC than she has currently scheduled.  Check  to see if pt wants to schedule one additional week, 2x that week    PT [place] Code: [email or web address]    Consulted and Agree with Plan of Care Patient;Family member/caregiver    Family Member Consulted husband             Patient will benefit from skilled therapeutic intervention in order to improve the following deficits and impairments:  Abnormal gait, Decreased balance, Decreased range of motion, Difficulty walking, Pain, Postural dysfunction, Decreased strength, Decreased mobility  Visit Diagnosis: Muscle weakness (generalized)  Chronic bilateral low back pain with bilateral sciatica  Abnormal posture     Problem List Patient Active Problem List   Diagnosis Date Noted   Impaired ambulation 07/21/2020   Lumbosacral radiculopathy    Weakness of both lower extremities    IDA (iron deficiency anemia) 05/24/2020   Symptomatic cholelithiasis 01/31/2011   ALLERGIC RHINITIS 07/02/2008   HEADACHE, CHRONIC 07/02/2008   DYSPNEA 07/02/2008   COUGH 07/02/2008   OBESITY 07/01/2008   SICKLE CELL TRAIT 07/01/2008   OBSESSIVE-COMPULSIVE DISORDER 07/01/2008   DEPRESSION 07/01/2008    Treysean Petruzzi W. 08/18/2020, 12:15 PM Frazier Butt., PT  [place] [address] [place], [place], [address] Phone: [phone]   Fax:  [phone]  Name: Miranda Padilla MRN: [id number] Date of Birth: [birth year]

## 2020-08-24 ENCOUNTER — Ambulatory Visit: Payer: 59 | Attending: Internal Medicine | Admitting: Physical Therapy

## 2020-08-24 ENCOUNTER — Other Ambulatory Visit: Payer: Self-pay

## 2020-08-24 DIAGNOSIS — R293 Abnormal posture: Secondary | ICD-10-CM | POA: Insufficient documentation

## 2020-08-24 DIAGNOSIS — M6281 Muscle weakness (generalized): Secondary | ICD-10-CM | POA: Insufficient documentation

## 2020-08-24 DIAGNOSIS — M5441 Lumbago with sciatica, right side: Secondary | ICD-10-CM | POA: Insufficient documentation

## 2020-08-24 DIAGNOSIS — R2689 Other abnormalities of gait and mobility: Secondary | ICD-10-CM | POA: Insufficient documentation

## 2020-08-24 DIAGNOSIS — M5442 Lumbago with sciatica, left side: Secondary | ICD-10-CM | POA: Diagnosis present

## 2020-08-24 DIAGNOSIS — G8929 Other chronic pain: Secondary | ICD-10-CM | POA: Diagnosis present

## 2020-08-24 NOTE — Therapy (Signed)
[place] [address] [place], [place], [address] Phone: [phone]   Fax:  [phone]  Physical Therapy Treatment  Patient Details  Name: Miranda Padilla MRN: [id number] Date of Birth: [birth year] Referring Provider (PT): (Dr. Corey Harold   Encounter Date: 08/24/2020   PT End of Session - 08/24/20 0816     Visit Number 6    Number of Visits 13    Date for PT Re-Evaluation 09/10/20    Authorization Type UHC    PT Start Time 0809   pt running late due to traffic   PT Stop Time 0845    PT Time Calculation (min) 36 min    Activity Tolerance Patient tolerated treatment well   reports less pain at end of session   Behavior During Therapy [facility] for tasks assessed/performed             Past Medical History:  Diagnosis Date   Abdominal distension    Abdominal pain    Anemia    Anxiety    Arthritis    Asthma    Bronchitis    Constipation    Cough    Depression    Family history of adverse reaction to anesthesia    Pt mother would wake up during procedures   GERD (gastroesophageal reflux disease)    Headache(784.0)    Nausea & vomiting    Obsessive compulsive disorder    not on any medication at this time   Sickle cell trait ([place])    Thalassemia     Past Surgical History:  Procedure Laterality Date   AXILLARY LYMPH NODE BIOPSY Right 07/01/2020   Procedure: EXCISIONAL BIOPSY RIGHT AXILLARY LYMPH NODE;  Surgeon: Coralie Keens, MD;  Location: [facility];  Service: General;  Laterality: Right;   CHOLECYSTECTOMY  02/03/2011   Procedure: LAPAROSCOPIC CHOLECYSTECTOMY;  Surgeon: Harl Bowie, MD;  Location: [facility];  Service: General;  Laterality: N/A;  Laparoscopic Choleystectomy   COLONOSCOPY WITH PROPOFOL N/A 06/01/2016   Procedure: COLONOSCOPY WITH PROPOFOL;  Surgeon: Ronnette Juniper, MD;  Location: [facility];  Service: Gastroenterology;  Laterality: N/A;   MOUTH SURGERY  2000     There were no vitals filed for this visit.   Subjective Assessment - 08/24/20 0812     Subjective No new compliaints. No falls. Updated HEP is going well. [facility] to her back.    Patient is accompained by: Family member    Pertinent History Had been going thorugh assessment of lymph nodes to r/o cancer (no cancer per pt report); NCV normal    Limitations Standing;Walking;House hold activities    Diagnostic tests MRI mild progression of L recess/foraminal protrusion of L3-4    Currently in Pain? Yes    Pain Score 6     Pain Location Foot   and toes   Pain Orientation Right;Left    Pain Descriptors / Indicators Tingling;Aching;Sharp;Pins and needles    Pain Type Acute pain;Neuropathic pain    Pain Onset 1 to 4 weeks ago    Pain Frequency Constant    Aggravating Factors  certain movements    Pain Relieving Factors gabapentin/cymbalta                     [facility] Adult PT Treatment/Exercise - 08/24/20 0816       Transfers   Transfers Sit to Stand;Stand to Sit    Sit to Stand 5: Supervision;With upper  extremity assist;4: Min guard;From bed    Stand to Sit 5: Supervision;With upper extremity assist;To bed      Ambulation/Gait   Ambulation/Gait Yes    Ambulation/Gait Assistance 5: Supervision    Ambulation Distance (Feet) --   around clinic with session   Assistive device Rolling walker    Gait Pattern Step-through pattern;Decreased step length - right;Decreased step length - left;Decreased stride length;Decreased hip/knee flexion - right;Decreased hip/knee flexion - left;Decreased dorsiflexion - right;Decreased dorsiflexion - left;Right foot flat;Left foot flat;Wide base of support    Ambulation Surface Level;Outdoor      Exercises   Exercises Other Exercises    Other Exercises  seated at edge of mat: use of fitter board with bil feet for ant/posterior "taps" for PF/DF x 10 reps, then lateral "taps" to work on  inversion/eversion. Pt unable to fully tap the board due to decreased range of motion with assist at ankle for motions and stabilization at knee to prevent compensatory movements.      Lumbar Exercises: Stretches   Passive Hamstring Stretch Right;Left;2 reps;60 seconds;Limitations    Passive Hamstring Stretch Limitations supine on mat table with overpressure at need to maintain knee extension with passive stretch      Lumbar Exercises: Aerobic   Recumbent Bike Scifit UE/LE's level 2.5 for 5 minutes with goal >/= 40 steps per minute for strengthening, reciprocal movements and stretching with full knee extension emphasized.      Lumbar Exercises: Supine   Heel Slides 10 reps    Heel Slides Limitations with abdominal bracing for each rep, alternating sides      Manual Therapy   Manual Therapy Joint mobilization    Manual therapy comments passive stretching to bil ankles for DF, inversion and eversion    Joint Mobilization bil talocrural joints in posterior direction for increased ankle range of motion, grade 2 mobs peformed; gentle mobs of metatarsals of forefoot for increased mobility/decreased tightness                      PT Short Term Goals - 08/18/20 0856       PT SHORT TERM GOAL #1   Title Pt will be independent with HEP for improved flexibility, strength, decreased pain, and gait.  TARGET 08/20/2020    Time 3    Period Weeks    Status Achieved      PT SHORT TERM GOAL #2   Title Pt will report at least 3 means to decrease pain in low back and lower extremities.    Baseline reports 6/10 pain at eval; pt reports positioning, heat, some gentle exercises    Time 3    Period Weeks    Status Achieved      PT SHORT TERM GOAL #3   Title Pt will improve SLR test bilaterally by 5 degrees bilaterally before onset of pain.    Baseline LLE 32 degrees, RLE 30 degrees    Time 3    Period Weeks    Status New      PT SHORT TERM GOAL #4   Title Pt will perform at least 4 of 5  sit<>stand transfers with minimal to no UE support, indicating increased functional lower extremity strength.    Baseline still requires UE support, receptive to performing with technique to lessen UE support 08/18/2020    Time 3    Period Weeks    Status Not Met  PT Long Term Goals - 08/03/20 2034       PT LONG TERM GOAL #1   Title Pt will be independent with HEP for improved flexibility,strength, pain, transfers, and gait.  TARGET 09/10/2020    Time 6    Period Weeks    Status New      PT LONG TERM GOAL #2   Title Gait velocity to improve, with pt ambulating 32.8 ft in less than or equal to 45 seconds.    Baseline > 1 minute at eval    Time 6    Period Weeks    Status New      PT LONG TERM GOAL #3   Title Pt will ambulate at least 50-100 ft, supervision, with cane, for imrpoved household mobility.    Time 6    Period Weeks    Status New      PT LONG TERM GOAL #4   Title Further balance testing to be assessed, as it was unable to be completed at time of eval due to pt's sensitivity to pain with activities/time constraints                   Plan - 08/24/20 0816     Clinical Impression Statement Today's skilled session continued to address stretching and strengthening of LE's with pt reporting feeling better at end of session. The pt is progressing toward goals and should benefit from continued PT to progress toward unmet goals.    Personal Factors and Comorbidities Comorbidity 3+    Comorbidities PMH:  chronic lumbar radiculopathy, RA, GERD, obesity, lymphadenopathy, 07/22/20:MRI showed mild progression of L recess/foraminal protrusion at L3-4    Examination-Activity Limitations Locomotion Level;Transfers;Stand    Examination-Participation Restrictions Occupation;Community Activity    Stability/Clinical Decision Making Evolving/Moderate complexity    Rehab Potential Good    PT Frequency 2x / week    PT Duration 6 weeks   including eval week   PT  Treatment/Interventions ADLs/Self Care Home Management;Gait training;Electrical Stimulation;Cryotherapy;DME Instruction;Neuromuscular re-education;Balance training;Therapeutic exercise;Therapeutic activities;Functional mobility training;Moist Heat;Traction;Ultrasound;Patient/family education;Manual techniques;Passive range of motion    PT Next Visit Plan Review and modify HEP as needed. Continue piriformis, hamstring, gastroc stretching (maybe more dynamic in standing?); review sit to stand technique.  Continue to strengthen hips/core. Continue nerve flossing/gliding, lumbar flexibility and potential traction to assist with pain. Trial standing exercises/stability; walking in // bars with no UE support.    PT Home Exercise Plan MedbridgeAccess Code: [email or web address]    Consulted and Agree with Plan of Care Patient;Family member/caregiver    Family Member Consulted husband             Patient will benefit from skilled therapeutic intervention in order to improve the following deficits and impairments:  Abnormal gait, Decreased balance, Decreased range of motion, Difficulty walking, Pain, Postural dysfunction, Decreased strength, Decreased mobility  Visit Diagnosis: Muscle weakness (generalized)  Chronic bilateral low back pain with bilateral sciatica  Abnormal posture  Other abnormalities of gait and mobility     Problem List Patient Active Problem List   Diagnosis Date Noted   Impaired ambulation 07/21/2020   Lumbosacral radiculopathy    Weakness of both lower extremities    IDA (iron deficiency anemia) 05/24/2020   Symptomatic cholelithiasis 01/31/2011   ALLERGIC RHINITIS 07/02/2008   HEADACHE, CHRONIC 07/02/2008   DYSPNEA 07/02/2008   COUGH 07/02/2008   OBESITY 07/01/2008   SICKLE CELL TRAIT 07/01/2008   OBSESSIVE-COMPULSIVE DISORDER 07/01/2008   DEPRESSION 07/01/2008    Willow Ora,  PTA, [facility] [address] [place], [place]  [phone] 08/24/20, 3:10 PM   Name: Miranda Padilla MRN: [id number] Date of Birth: 08-12-1965

## 2020-08-27 ENCOUNTER — Encounter: Payer: Self-pay | Admitting: Physical Therapy

## 2020-08-27 ENCOUNTER — Other Ambulatory Visit: Payer: Self-pay

## 2020-08-27 ENCOUNTER — Ambulatory Visit: Payer: 59 | Admitting: Physical Therapy

## 2020-08-27 DIAGNOSIS — M6281 Muscle weakness (generalized): Secondary | ICD-10-CM | POA: Diagnosis not present

## 2020-08-27 DIAGNOSIS — R2689 Other abnormalities of gait and mobility: Secondary | ICD-10-CM

## 2020-08-27 NOTE — Therapy (Addendum)
[facility] [address] [place], [place], [address] Phone: [phone]   Fax:  [phone]  Physical Therapy Treatment  Patient Details  Name: Miranda Padilla MRN: [id number] Date of Birth: 1965-10-03 Referring Provider (PT): (Dr. Corey Harold   Encounter Date: 08/27/2020   PT End of Session - 08/27/20 0850     Visit Number 7    Number of Visits 13    Date for PT Re-Evaluation 09/10/20    Authorization Type [employer]    PT Start Time 0850    PT Stop Time 0930    PT Time Calculation (min) 40 min    Activity Tolerance Patient tolerated treatment well   reports less pain at end of session   Behavior During Therapy [facility] for tasks assessed/performed             Past Medical History:  Diagnosis Date   Abdominal distension    Abdominal pain    Anemia    Anxiety    Arthritis    Asthma    Bronchitis    Constipation    Cough    Depression    Family history of adverse reaction to anesthesia    Pt mother would wake up during procedures   GERD (gastroesophageal reflux disease)    Headache(784.0)    Nausea & vomiting    Obsessive compulsive disorder    not on any medication at this time   Sickle cell trait ([place])    Thalassemia     Past Surgical History:  Procedure Laterality Date   AXILLARY LYMPH NODE BIOPSY Right 07/01/2020   Procedure: EXCISIONAL BIOPSY RIGHT AXILLARY LYMPH NODE;  Surgeon: Coralie Keens, MD;  Location: [facility];  Service: General;  Laterality: Right;   CHOLECYSTECTOMY  02/03/2011   Procedure: LAPAROSCOPIC CHOLECYSTECTOMY;  Surgeon: Harl Bowie, MD;  Location: [facility];  Service: General;  Laterality: N/A;  Laparoscopic Choleystectomy   COLONOSCOPY WITH PROPOFOL N/A 06/01/2016   Procedure: COLONOSCOPY WITH PROPOFOL;  Surgeon: Ronnette Juniper, MD;  Location: [facility];  Service: Gastroenterology;  Laterality: N/A;   MOUTH SURGERY  2000    There were no vitals filed for  this visit.   Subjective Assessment - 08/27/20 0849     Subjective Did the injection on Tuesday, on the right side; it's still a little sore where they did the shot.  The pain has eased up in my back, but my back feels weak.  The pain in my feet is a little better, but just can't feel again.  No restrictions/precautions from the injection.  I'm getting concerned about how long will I need to use the walker, but can't go without it due to decreased sensation in feet.    Patient is accompained by: Family member    Pertinent History Had been going thorugh assessment of lymph nodes to r/o cancer (no cancer per pt report); NCV normal    Limitations Standing;Walking;House hold activities    Diagnostic tests MRI mild progression of L recess/foraminal protrusion of L3-4    Patient Stated Goals Pt's goals are to get back to walking independently.    Currently in Pain? Yes    Pain Score 4     Pain Location Back    Pain Orientation Right;Left;Lower    Pain Descriptors / Indicators Sore;Aching    Pain Onset 1 to 4 weeks ago    Pain Frequency Constant    Aggravating Factors  certain movements    Pain Relieving Factors injection  has helped, stretching.    Pain Score 6    Pain Location Ankle    Pain Orientation Right;Left    Pain Descriptors / Indicators Tightness;Numbness    Pain Type Acute pain    Pain Onset 1 to 4 weeks ago    Pain Frequency Constant    Aggravating Factors  unsure, all the time    Pain Relieving Factors warmth                               [facility] Adult PT Treatment/Exercise - 08/27/20 0001       Transfers   Transfers Sit to Stand;Stand to Sit    Sit to Stand 5: Supervision;With upper extremity assist;4: Min guard;From bed    Stand to Sit 5: Supervision;With upper extremity assist;To bed      Ambulation/Gait   Ambulation/Gait Yes    Ambulation/Gait Assistance 5: Supervision    Ambulation Distance (Feet) 115 Feet   x 2   Assistive device Rolling walker     Gait Pattern Step-through pattern;Decreased step length - right;Decreased step length - left;Decreased stride length;Decreased hip/knee flexion - right;Decreased hip/knee flexion - left;Decreased dorsiflexion - right;Decreased dorsiflexion - left;Right foot flat;Left foot flat;Wide base of support    Ambulation Surface Level;Indoor    Gait Comments Cues to lessen support through RW and for deliberate heelstrike      Standardized Balance Assessment   Standardized Balance Assessment Berg Balance Test      Berg Balance Test   Sit to Stand Able to stand  independently using hands    Standing Unsupported Able to stand 2 minutes with supervision    Sitting with Back Unsupported but Feet Supported on Floor or Stool Able to sit safely and securely 2 minutes    Stand to Sit Controls descent by using hands    Transfers Able to transfer safely, definite need of hands    Standing Unsupported with Eyes Closed Able to stand 10 seconds with supervision    Standing Ubsupported with Feet Together Able to place feet together independently but unable to hold for 30 seconds   attempted x 3, 5-10 sec   From Standing, Reach Forward with Outstretched Arm Can reach confidently >25 cm (10")   9.5   From Standing Position, Pick up Object from Floor Able to pick up shoe, needs supervision    From Standing Position, Turn to Look Behind Over each Shoulder Needs supervision when turning    Turn 360 Degrees Needs assistance while turning    Standing Unsupported, Alternately Place Feet on Step/Stool Needs assistance to keep from falling or unable to try    Standing Unsupported, One Foot in Front Needs help to step but can hold 15 seconds    Standing on One Leg Unable to try or needs assist to prevent fall    Total Score 30      Exercises   Exercises Ankle;Knee/Hip;Other Exercises    Other Exercises  Standing at counter, gentle rocking exercises through hips/ankles:  wide BOS lateral weightshifting x 10 reps, then wide  BOS anterior/posterior weightshifting x 10 reps, stagger stance rocking forward/back x 10 reps each foot position; heel/toe raises for limits of stability x 10 reps      Ankle Exercises: Stretches   Other Stretch seated gastroc stretch at edge of aerobic step, 5 sec, 5 reps bilat.      Ankle Exercises: Seated   Heel Raises Both;10 reps  Toe Raise 10 reps                    PT Education - 08/27/20 1252     Education Details Progress with goals and POC; pt agreeable to adding one more week of appts to get to end of current POC; discussed Berg results and fall risk/still need to use RW    Person(s) Educated Patient;Spouse    Methods Explanation    Comprehension Verbalized understanding              PT Short Term Goals - 08/18/20 0856       PT SHORT TERM GOAL #1   Title Pt will be independent with HEP for improved flexibility, strength, decreased pain, and gait.  TARGET 08/20/2020    Time 3    Period Weeks    Status Achieved      PT SHORT TERM GOAL #2   Title Pt will report at least 3 means to decrease pain in low back and lower extremities.    Baseline reports 6/10 pain at eval; pt reports positioning, heat, some gentle exercises    Time 3    Period Weeks    Status Achieved      PT SHORT TERM GOAL #3   Title Pt will improve SLR test bilaterally by 5 degrees bilaterally before onset of pain.    Baseline LLE 32 degrees, RLE 30 degrees    Time 3    Period Weeks    Status New      PT SHORT TERM GOAL #4   Title Pt will perform at least 4 of 5 sit<>stand transfers with minimal to no UE support, indicating increased functional lower extremity strength.    Baseline still requires UE support, receptive to performing with technique to lessen UE support 08/18/2020    Time 3    Period Weeks    Status Not Met               PT Long Term Goals - 08/27/20 1253       PT LONG TERM GOAL #1   Title Pt will be independent with HEP for improved flexibility,strength,  pain, transfers, and gait.  TARGET 09/10/2020    Time 6    Period Weeks    Status New      PT LONG TERM GOAL #2   Title Gait velocity to improve, with pt ambulating 32.8 ft in less than or equal to 45 seconds.  GOAL REVISION:  Pt to improve gait velocity to at least 1.8 ft/sec for improved gait efficiency and safety.    Baseline > 1 minute at eval; 08/27/20 initial goal achieved with pt ambulating 32.8 ft in 19.94 seconds.  Goal revised-see above    Time 6    Period Weeks    Status Revised      PT LONG TERM GOAL #3   Title Pt will ambulate at least 50-100 ft, supervision, with cane, for imrpoved household mobility.    Time 6    Period Weeks    Status New      PT LONG TERM GOAL #4   Title Further balance testing to be assessed, as it was unable to be completed at time of eval due to pt's sensitivity to pain with activities/time constraints.  GOAL UPDATE:  Merrilee Jansky to improve 38/56 for decreased fall risk.    Baseline Merrilee Jansky 30/56 08/27/2020    Time 2    Period Weeks    Status  New    Target Date 09/10/20                   Plan - 08/27/20 1259     Clinical Impression Statement Skilled PT session today focused on seated and standing exercises to assist with more independent and dynamic stretching.  Pt questioning when she can get off RW, and Berg assessed today, with Berg score 30/56.  Berg score indicates increased fall risk and need to continue with RW at home until balance improves >38/56.  LTG for balance revised; LTG for gait velocity met, as pt's gait vleocity is 1.64 ft/sec today.  Pt is progressing with mobility, but strength, flexibility and balance continue to limit independence with mobility at this time.  Pt will continue to benefit from skilled PT to further address strength, flexibility, balance, and gait training for progression towards improved functional mobility and independence.    Personal Factors and Comorbidities Comorbidity 3+    Comorbidities PMH:  chronic lumbar  radiculopathy, RA, GERD, obesity, lymphadenopathy, 07/22/20:MRI showed mild progression of L recess/foraminal protrusion at L3-4    Examination-Activity Limitations Locomotion Level;Transfers;Stand    Examination-Participation Restrictions Occupation;Community Activity    Stability/Clinical Decision Making Evolving/Moderate complexity    Rehab Potential Good    PT Frequency 2x / week    PT Duration 6 weeks   including eval week   PT Treatment/Interventions ADLs/Self Care Home Management;Gait training;Electrical Stimulation;Cryotherapy;DME Instruction;Neuromuscular re-education;Balance training;Therapeutic exercise;Therapeutic activities;Functional mobility training;Moist Heat;Traction;Ultrasound;Patient/family education;Manual techniques;Passive range of motion    PT Next Visit Plan Continue to strengthen hips/core. Continue nerve flossing/gliding, lumbar flexibility and potential traction to assist with pain as needed. Trial standing exercises again at counter (in instruction section of today's note); if pt likes these and does well, please add to her HEP.  Progress lower extremity strengthening and gait.    PT Home Exercise Plan [email or web address]: [email or web address]    Consulted and Agree with Plan of Care Patient;Family member/caregiver    Family Member Consulted husband             Patient will benefit from skilled therapeutic intervention in order to improve the following deficits and impairments:  Abnormal gait, Decreased balance, Decreased range of motion, Difficulty walking, Pain, Postural dysfunction, Decreased strength, Decreased mobility  Visit Diagnosis: Muscle weakness (generalized)  Other abnormalities of gait and mobility     Problem List Patient Active Problem List   Diagnosis Date Noted   Impaired ambulation 07/21/2020   Lumbosacral radiculopathy    Weakness of both lower extremities    IDA (iron deficiency anemia) 05/24/2020   Symptomatic cholelithiasis 01/31/2011    ALLERGIC RHINITIS 07/02/2008   HEADACHE, CHRONIC 07/02/2008   DYSPNEA 07/02/2008   COUGH 07/02/2008   OBESITY 07/01/2008   SICKLE CELL TRAIT 07/01/2008   OBSESSIVE-COMPULSIVE DISORDER 07/01/2008   DEPRESSION 07/01/2008    Benney [place] W. 08/27/2020, 1:05 PM Frazier Butt., PT   [facility] [address] [place], [place], [address] Phone: [phone]   Fax:  [phone]  Name: Miranda Padilla MRN: [id number] Date of Birth: 03/16/1965

## 2020-08-27 NOTE — Patient Instructions (Signed)
Access Code: [email or web address] URL: [email or web address] Date: 08/27/2020 Prepared by: Mady Haagensen  Side to side weightshift - 1-2 x daily - 5 x weekly - 1-2 sets - 10 reps Staggered Stance Forward Backward Weight Shift with Counter Support - 1-2 x daily - 5 x weekly - 1-2 sets - 10 reps Standing posture stretch - 1-2 x daily - 5 x weekly - 1-2 sets - 10 reps - 5 sec hold  (Discussed adding these exercises to her current routine, and wanted to try them again prior to issueing).  Did not yet issue at 08/27/20 visit.

## 2020-09-01 ENCOUNTER — Ambulatory Visit: Payer: 59 | Admitting: Physical Therapy

## 2020-09-01 ENCOUNTER — Other Ambulatory Visit: Payer: Self-pay

## 2020-09-01 DIAGNOSIS — R2689 Other abnormalities of gait and mobility: Secondary | ICD-10-CM

## 2020-09-01 DIAGNOSIS — M6281 Muscle weakness (generalized): Secondary | ICD-10-CM | POA: Diagnosis not present

## 2020-09-01 NOTE — Patient Instructions (Signed)
Access Code: [email or web address] URL: [email or web address] Date: 09/01/2020 Prepared by: Willow Ora  Exercises Supine Figure 4 Piriformis Stretch - 1 x daily - 7 x weekly - 3 sets - 10 sec hold Supine Active Straight Leg Raise - 1 x daily - 7 x weekly - 2 sets - 10 reps Supine Sciatic Nerve Glide - 1 x daily - 7 x weekly - 1 sets - 10 reps Supine March with Posterior Pelvic Tilt - 1 x daily - 7 x weekly - 2 sets - 10 reps Clamshell with Resistance - 1 x daily - 7 x weekly - 2 sets - 10 reps Supine Bridge - 1 x daily - 7 x weekly - 1 sets - 10 reps Standing Gastroc Stretch on Step - 1 x daily - 7 x weekly - 2 sets - 20 sec hold Standing Soleus Stretch on Step - 1 x daily - 7 x weekly - 2 sets - 20 sec hold Seated Ankle Plantar Flexion with Resistance Loop - 1 x daily - 7 x weekly - 2 sets - 10 reps CLX Ankle Dorsiflexion and Eversion - 1 x daily - 7 x weekly - 2 sets - 10 reps Seated Ankle Inversion with Anchored Resistance - 1 x daily - 7 x weekly - 3 sets - 10 reps Seated Ankle Inversion Eversion PROM - 1 x daily - 7 x weekly - 2 sets - 30 sec hold  Added today to HEP:  Side to side weightshift - 1-2 x daily - 5 x weekly - 1-2 sets - 10 reps Staggered Stance Forward Backward Weight Shift with Counter Support - 1-2 x daily - 5 x weekly - 1-2 sets - 10 reps

## 2020-09-02 NOTE — Therapy (Signed)
[facility] [address] [place], [place], [address] Phone: [phone]   Fax:  [phone]  Physical Therapy Treatment  Patient Details  Name: Miranda Padilla MRN: [id number] Date of Birth: 09-Dec-1965 Referring Provider (PT): (Dr. Corey Harold   Encounter Date: 09/01/2020   PT End of Session - 09/01/20 1111     Visit Number 8    Number of Visits 13    Date for PT Re-Evaluation 09/10/20    Authorization Type UHC    PT Start Time 1108   in bathroom prior to session   PT Stop Time 1145    PT Time Calculation (min) 37 min    Activity Tolerance Patient tolerated treatment well   reports less pain at end of session   Behavior During Therapy [facility] for tasks assessed/performed             Past Medical History:  Diagnosis Date   Abdominal distension    Abdominal pain    Anemia    Anxiety    Arthritis    Asthma    Bronchitis    Constipation    Cough    Depression    Family history of adverse reaction to anesthesia    Pt mother would wake up during procedures   GERD (gastroesophageal reflux disease)    Headache(784.0)    Nausea & vomiting    Obsessive compulsive disorder    not on any medication at this time   Sickle cell trait ([place])    Thalassemia     Past Surgical History:  Procedure Laterality Date   AXILLARY LYMPH NODE BIOPSY Right 07/01/2020   Procedure: EXCISIONAL BIOPSY RIGHT AXILLARY LYMPH NODE;  Surgeon: Coralie Keens, MD;  Location: [facility];  Service: General;  Laterality: Right;   CHOLECYSTECTOMY  02/03/2011   Procedure: LAPAROSCOPIC CHOLECYSTECTOMY;  Surgeon: Harl Bowie, MD;  Location: [facility];  Service: General;  Laterality: N/A;  Laparoscopic Choleystectomy   COLONOSCOPY WITH PROPOFOL N/A 06/01/2016   Procedure: COLONOSCOPY WITH PROPOFOL;  Surgeon: Ronnette Juniper, MD;  Location: [facility];  Service: Gastroenterology;  Laterality: N/A;   MOUTH SURGERY  2000     There were no vitals filed for this visit.   Subjective Assessment - 09/01/20 1108     Subjective Does feel she is having less back pain since the injection. Still with issues in the feet- numbness with pain at times "like they are water logged from being in the water too much".    Patient is accompained by: Family member   spouse   Pertinent History Had been going thorugh assessment of lymph nodes to r/o cancer (no cancer per pt report); NCV normal    Limitations Standing;Walking;House hold activities    Diagnostic tests MRI mild progression of L recess/foraminal protrusion of L3-4    Patient Stated Goals Pt's goals are to get back to walking independently.    Currently in Pain? Yes    Pain Score 4     Pain Location Back    Pain Orientation Right;Left;Lower   left>right   Pain Descriptors / Indicators Aching;Sore    Pain Type Acute pain;Neuropathic pain    Pain Onset More than a month ago    Pain Frequency Intermittent    Aggravating Factors  certain movements    Pain Relieving Factors recent injection helped, stretching    Pain Score 7    Pain Location Leg   mid calf down to feet   Pain Orientation Right;Left  Pain Descriptors / Indicators Numbness;Tingling    Pain Type Acute pain;Neuropathic pain    Pain Onset More than a month ago    Pain Frequency Constant    Aggravating Factors  unsure as it's there all the time    Pain Relieving Factors warmth                               [facility] Adult PT Treatment/Exercise - 09/01/20 1112       Transfers   Transfers Sit to Stand;Stand to Sit    Sit to Stand 5: Supervision;With upper extremity assist;From bed;From chair/3-in-1    Stand to Sit 5: Supervision;With upper extremity assist;To bed;To chair/3-in-1      Ambulation/Gait   Ambulation/Gait Yes    Ambulation/Gait Assistance 5: Supervision    Ambulation/Gait Assistance Details working on heel>toe step progression on bil side with tall posture. pt able  to increase consecutive distance today with mild increase in back pain by ~2 levels on scale, no increase in bil foot/leg pain until about last 10 feet, which resolved quickly with sitting down. Back pain required several minutes sitting down to recover to baseline number.    Ambulation Distance (Feet) 230 Feet   x1, plus around clinc with session   Assistive device Rolling walker    Gait Pattern Step-through pattern;Decreased step length - right;Decreased step length - left;Decreased stride length;Decreased hip/knee flexion - right;Decreased hip/knee flexion - left;Decreased dorsiflexion - right;Decreased dorsiflexion - left;Right foot flat;Left foot flat;Wide base of support    Ambulation Surface Level;Indoor      Exercises   Exercises Other Exercises    Other Exercises  Standing at counter, gentle rocking exercises through hips/ankles:  wide BOS lateral weightshifting x 10 reps, then wide BOS anterior/posterior weightshifting x 10 reps, stagger stance rocking forward/back x 10 reps each foot position; heel/toe raises for limits of stability x 10 reps. added all except for heel toe raises to HEP this session. cues needed for posture and ex form with all ex's performed.      Ankle Exercises: Stretches   Gastroc Stretch 5 reps;10 seconds;Limitations    Gastroc Stretch Limitations seated with heels hanging off edge of aerobic step    Other Stretch seated at edge of mat using fitter board-            issued to HEP this session:  Added today to HEP:  Side to side weightshift - 1-2 x daily - 5 x weekly - 1-2 sets - 10 reps Staggered Stance Forward Backward Weight Shift with Counter Support - 1-2 x daily - 5 x weekly - 1-2 sets - 10 reps       PT Education - 09/01/20 1630     Education Details updates to HEP    Person(s) Educated Patient;Spouse    Methods Explanation;Demonstration;Verbal cues;Handout    Comprehension Verbalized understanding;Returned demonstration;Verbal cues  required;Need further instruction              PT Short Term Goals - 08/18/20 0856       PT SHORT TERM GOAL #1   Title Pt will be independent with HEP for improved flexibility, strength, decreased pain, and gait.  TARGET 08/20/2020    Time 3    Period Weeks    Status Achieved      PT SHORT TERM GOAL #2   Title Pt will report at least 3 means to decrease pain in low back and  lower extremities.    Baseline reports 6/10 pain at eval; pt reports positioning, heat, some gentle exercises    Time 3    Period Weeks    Status Achieved      PT SHORT TERM GOAL #3   Title Pt will improve SLR test bilaterally by 5 degrees bilaterally before onset of pain.    Baseline LLE 32 degrees, RLE 30 degrees    Time 3    Period Weeks    Status New      PT SHORT TERM GOAL #4   Title Pt will perform at least 4 of 5 sit<>stand transfers with minimal to no UE support, indicating increased functional lower extremity strength.    Baseline still requires UE support, receptive to performing with technique to lessen UE support 08/18/2020    Time 3    Period Weeks    Status Not Met               PT Long Term Goals - 08/27/20 1253       PT LONG TERM GOAL #1   Title Pt will be independent with HEP for improved flexibility,strength, pain, transfers, and gait.  TARGET 09/10/2020    Time 6    Period Weeks    Status New      PT LONG TERM GOAL #2   Title Gait velocity to improve, with pt ambulating 32.8 ft in less than or equal to 45 seconds.  GOAL REVISION:  Pt to improve gait velocity to at least 1.8 ft/sec for improved gait efficiency and safety.    Baseline > 1 minute at eval; 08/27/20 initial goal achieved with pt ambulating 32.8 ft in 19.94 seconds.  Goal revised-see above    Time 6    Period Weeks    Status Revised      PT LONG TERM GOAL #3   Title Pt will ambulate at least 50-100 ft, supervision, with cane, for imrpoved household mobility.    Time 6    Period Weeks    Status New      PT  LONG TERM GOAL #4   Title Further balance testing to be assessed, as it was unable to be completed at time of eval due to pt's sensitivity to pain with activities/time constraints.  GOAL UPDATE:  Merrilee Jansky to improve 38/56 for decreased fall risk.    Baseline Merrilee Jansky 30/56 08/27/2020    Time 2    Period Weeks    Status New    Target Date 09/10/20                   Plan - 09/01/20 1112     Clinical Impression Statement Today's skilled session continued to focus on LE stretching/ankle mobility prior to gait and standing ex's. Pt able to demo heel>toe step progression with most of gait performed in session. Remainder of session focused on performance of standing ex's from prior session at counter with 2 added to HEP. The pt is progressing toward goals and should benefit from continued PT to progress toward unmet goals.    Personal Factors and Comorbidities Comorbidity 3+    Comorbidities PMH:  chronic lumbar radiculopathy, RA, GERD, obesity, lymphadenopathy, 07/22/20:MRI showed mild progression of L recess/foraminal protrusion at L3-4    Examination-Activity Limitations Locomotion Level;Transfers;Stand    Examination-Participation Restrictions Occupation;Community Activity    Stability/Clinical Decision Making Evolving/Moderate complexity    Rehab Potential Good    PT Frequency 2x / week    PT Duration  6 weeks   including eval week   PT Treatment/Interventions ADLs/Self Care Home Management;Gait training;Electrical Stimulation;Cryotherapy;DME Instruction;Neuromuscular re-education;Balance training;Therapeutic exercise;Therapeutic activities;Functional mobility training;Moist Heat;Traction;Ultrasound;Patient/family education;Manual techniques;Passive range of motion    PT Next Visit Plan Continue to strengthen hips/core. Continue nerve flossing/gliding, lumbar flexibility and potential traction to assist with pain as needed. Trial standing exercises again at counter (in instruction section of today's  note); if pt likes these and does well, please add to her HEP.  Progress lower extremity strengthening and gait.    PT Home Exercise Plan MedbridgeAccess Code: [email or web address]    Consulted and Agree with Plan of Care Patient;Family member/caregiver    Family Member Consulted husband             Patient will benefit from skilled therapeutic intervention in order to improve the following deficits and impairments:  Abnormal gait, Decreased balance, Decreased range of motion, Difficulty walking, Pain, Postural dysfunction, Decreased strength, Decreased mobility  Visit Diagnosis: Muscle weakness (generalized)  Other abnormalities of gait and mobility     Problem List Patient Active Problem List   Diagnosis Date Noted   Impaired ambulation 07/21/2020   Lumbosacral radiculopathy    Weakness of both lower extremities    IDA (iron deficiency anemia) 05/24/2020   Symptomatic cholelithiasis 01/31/2011   ALLERGIC RHINITIS 07/02/2008   HEADACHE, CHRONIC 07/02/2008   DYSPNEA 07/02/2008   COUGH 07/02/2008   OBESITY 07/01/2008   SICKLE CELL TRAIT 07/01/2008   OBSESSIVE-COMPULSIVE DISORDER 07/01/2008   DEPRESSION 07/01/2008    Willow Ora, PTA, [facility] [address] [place], [place] [address] [phone] 09/02/20, 2:08 PM   Name: Miranda Padilla MRN: [id number] Date of Birth: 01-Jul-1965

## 2020-09-03 ENCOUNTER — Other Ambulatory Visit: Payer: Self-pay

## 2020-09-03 ENCOUNTER — Ambulatory Visit: Payer: 59

## 2020-09-03 DIAGNOSIS — M6281 Muscle weakness (generalized): Secondary | ICD-10-CM | POA: Diagnosis not present

## 2020-09-03 DIAGNOSIS — R2689 Other abnormalities of gait and mobility: Secondary | ICD-10-CM

## 2020-09-03 DIAGNOSIS — G8929 Other chronic pain: Secondary | ICD-10-CM

## 2020-09-03 DIAGNOSIS — R293 Abnormal posture: Secondary | ICD-10-CM

## 2020-09-03 NOTE — Therapy (Signed)
[facility] [address] [place], [place], [address] Phone: [phone]   Fax:  [phone]  Physical Therapy Treatment  Patient Details  Name: Miranda Padilla MRN: [id number] Date of Birth: [birth year] Referring Provider (PT): (Dr. Corey Harold   Encounter Date: 09/03/2020   PT End of Session - 09/03/20 1104     Visit Number 9    Number of Visits 13    Date for PT Re-Evaluation 09/10/20    Authorization Type UHC    PT Start Time 1100    PT Stop Time 1145    PT Time Calculation (min) 45 min    Activity Tolerance Patient tolerated treatment well   reports less pain at end of session   Behavior During Therapy [facility] for tasks assessed/performed             Past Medical History:  Diagnosis Date   Abdominal distension    Abdominal pain    Anemia    Anxiety    Arthritis    Asthma    Bronchitis    Constipation    Cough    Depression    Family history of adverse reaction to anesthesia    Pt mother would wake up during procedures   GERD (gastroesophageal reflux disease)    Headache(784.0)    Nausea & vomiting    Obsessive compulsive disorder    not on any medication at this time   Sickle cell trait ([place])    Thalassemia     Past Surgical History:  Procedure Laterality Date   AXILLARY LYMPH NODE BIOPSY Right 07/01/2020   Procedure: EXCISIONAL BIOPSY RIGHT AXILLARY LYMPH NODE;  Surgeon: Coralie Keens, MD;  Location: [facility];  Service: General;  Laterality: Right;   CHOLECYSTECTOMY  02/03/2011   Procedure: LAPAROSCOPIC CHOLECYSTECTOMY;  Surgeon: Harl Bowie, MD;  Location: [facility];  Service: General;  Laterality: N/A;  Laparoscopic Choleystectomy   COLONOSCOPY WITH PROPOFOL N/A 06/01/2016   Procedure: COLONOSCOPY WITH PROPOFOL;  Surgeon: Ronnette Juniper, MD;  Location: [facility];  Service: Gastroenterology;  Laterality: N/A;   MOUTH SURGERY  2000    There were no vitals filed for  this visit.   Subjective Assessment - 09/03/20 1106     Subjective Does feel she is having less back pain since the injection. Still with issues in the feet- numbness with pain at times "like they are water logged from being in the water too much".    Patient is accompained by: Family member   spouse   Pertinent History Had been going thorugh assessment of lymph nodes to r/o cancer (no cancer per pt report); NCV normal    Limitations Standing;Walking;House hold activities    Diagnostic tests MRI mild progression of L recess/foraminal protrusion of L3-4    Patient Stated Goals Pt's goals are to get back to walking independently.    Pain Onset More than a month ago    Pain Onset More than a month ago                Manual therapy: Soft tissue mobilization to bil thoracolumbar paraspinalis, quadratus lumborum, gluts, multifidus Self mobilization: sidelying with hipsknees at 90 deg flexion, pt rotates backwards with upper trunk and glides top knee forward to isolate mobilization at TL junction: 20x to each side, cues on doing small motion   Gait training:1 x 700' with RW, cues to relax shoulders, maintaining upright posture  Discussed pain managemnt strategies with taking rest breaks, sitting  down, performing forward flexion stretches.  Discussed sleeping positions: knees and ankles together in sidelying to maintain neutral lumbar spine and avoiding prolonged twisted position. Avoiding flat back position in supine if possible as it aggravates her symptoms.                         PT Short Term Goals - 08/18/20 0856       PT SHORT TERM GOAL #1   Title Pt will be independent with HEP for improved flexibility, strength, decreased pain, and gait.  TARGET 08/20/2020    Time 3    Period Weeks    Status Achieved      PT SHORT TERM GOAL #2   Title Pt will report at least 3 means to decrease pain in low back and lower extremities.    Baseline reports 6/10 pain at  eval; pt reports positioning, heat, some gentle exercises    Time 3    Period Weeks    Status Achieved      PT SHORT TERM GOAL #3   Title Pt will improve SLR test bilaterally by 5 degrees bilaterally before onset of pain.    Baseline LLE 32 degrees, RLE 30 degrees    Time 3    Period Weeks    Status New      PT SHORT TERM GOAL #4   Title Pt will perform at least 4 of 5 sit<>stand transfers with minimal to no UE support, indicating increased functional lower extremity strength.    Baseline still requires UE support, receptive to performing with technique to lessen UE support 08/18/2020    Time 3    Period Weeks    Status Not Met               PT Long Term Goals - 08/27/20 1253       PT LONG TERM GOAL #1   Title Pt will be independent with HEP for improved flexibility,strength, pain, transfers, and gait.  TARGET 09/10/2020    Time 6    Period Weeks    Status New      PT LONG TERM GOAL #2   Title Gait velocity to improve, with pt ambulating 32.8 ft in less than or equal to 45 seconds.  GOAL REVISION:  Pt to improve gait velocity to at least 1.8 ft/sec for improved gait efficiency and safety.    Baseline > 1 minute at eval; 08/27/20 initial goal achieved with pt ambulating 32.8 ft in 19.94 seconds.  Goal revised-see above    Time 6    Period Weeks    Status Revised      PT LONG TERM GOAL #3   Title Pt will ambulate at least 50-100 ft, supervision, with cane, for imrpoved household mobility.    Time 6    Period Weeks    Status New      PT LONG TERM GOAL #4   Title Further balance testing to be assessed, as it was unable to be completed at time of eval due to pt's sensitivity to pain with activities/time constraints.  GOAL UPDATE:  Merrilee Jansky to improve 38/56 for decreased fall risk.    Baseline Merrilee Jansky 30/56 08/27/2020    Time 2    Period Weeks    Status New    Target Date 09/10/20                   Plan - 09/03/20 1203  Clinical Impression Statement Today's  session focused on manual therapy for pain management and improving walking endurance and patient education on finding "aggravating" factors and "relieving" factors and learning how to manage pain better by using "relieving" factors intermittently to be able to manage her function with ADLs and self care. Pt reported decreased pain with ambulation at end of the session and was able to ambulate longer distance with lesser discomfort.    Personal Factors and Comorbidities Comorbidity 3+    Comorbidities PMH:  chronic lumbar radiculopathy, RA, GERD, obesity, lymphadenopathy, 07/22/20:MRI showed mild progression of L recess/foraminal protrusion at L3-4    Examination-Activity Limitations Locomotion Level;Transfers;Stand    Examination-Participation Restrictions Occupation;Community Activity    Stability/Clinical Decision Making Evolving/Moderate complexity    Rehab Potential Good    PT Frequency 2x / week    PT Duration 6 weeks   including eval week   PT Treatment/Interventions ADLs/Self Care Home Management;Gait training;Electrical Stimulation;Cryotherapy;DME Instruction;Neuromuscular re-education;Balance training;Therapeutic exercise;Therapeutic activities;Functional mobility training;Moist Heat;Traction;Ultrasound;Patient/family education;Manual techniques;Passive range of motion    PT Next Visit Plan [facility]ERT NEXT SESSION: Continue to strengthen hips/core. Continue nerve flossing/gliding, lumbar flexibility and potential traction to assist with pain as needed. Trial standing exercises again at counter (in instruction section of today's note); if pt likes these and does well, please add to her HEP.  Progress lower extremity strengthening and gait.    PT Home Exercise Plan MedbridgeAccess Code: [email or web address]    Consulted and Agree with Plan of Care Patient;Family member/caregiver    Family Member Consulted husband             Patient will benefit from skilled therapeutic intervention in order to improve  the following deficits and impairments:  Abnormal gait, Decreased balance, Decreased range of motion, Difficulty walking, Pain, Postural dysfunction, Decreased strength, Decreased mobility  Visit Diagnosis: Muscle weakness (generalized)  Other abnormalities of gait and mobility  Chronic bilateral low back pain with bilateral sciatica  Abnormal posture     Problem List Patient Active Problem List   Diagnosis Date Noted   Impaired ambulation 07/21/2020   Lumbosacral radiculopathy    Weakness of both lower extremities    IDA (iron deficiency anemia) 05/24/2020   Symptomatic cholelithiasis 01/31/2011   ALLERGIC RHINITIS 07/02/2008   HEADACHE, CHRONIC 07/02/2008   DYSPNEA 07/02/2008   COUGH 07/02/2008   OBESITY 07/01/2008   SICKLE CELL TRAIT 07/01/2008   OBSESSIVE-COMPULSIVE DISORDER 07/01/2008   DEPRESSION 07/01/2008    Kerrie Pleasure, PT 09/03/2020, 12:08 PM  [facility] [address] [place], [place], [address] Phone: [phone]   Fax:  [phone]  Name: Miranda Padilla MRN: [id number] Date of Birth: May 24, 1965

## 2020-09-08 ENCOUNTER — Encounter: Payer: Self-pay | Admitting: Physical Therapy

## 2020-09-08 ENCOUNTER — Ambulatory Visit: Payer: 59 | Admitting: Physical Therapy

## 2020-09-08 ENCOUNTER — Other Ambulatory Visit: Payer: Self-pay

## 2020-09-08 ENCOUNTER — Inpatient Hospital Stay (HOSPITAL_BASED_OUTPATIENT_CLINIC_OR_DEPARTMENT_OTHER): Payer: 59 | Admitting: Family

## 2020-09-08 ENCOUNTER — Telehealth: Payer: Self-pay | Admitting: *Deleted

## 2020-09-08 ENCOUNTER — Inpatient Hospital Stay: Payer: 59 | Attending: Family

## 2020-09-08 DIAGNOSIS — R2689 Other abnormalities of gait and mobility: Secondary | ICD-10-CM

## 2020-09-08 DIAGNOSIS — D563 Thalassemia minor: Secondary | ICD-10-CM

## 2020-09-08 DIAGNOSIS — D509 Iron deficiency anemia, unspecified: Secondary | ICD-10-CM | POA: Diagnosis present

## 2020-09-08 DIAGNOSIS — Z79899 Other long term (current) drug therapy: Secondary | ICD-10-CM | POA: Insufficient documentation

## 2020-09-08 DIAGNOSIS — D573 Sickle-cell trait: Secondary | ICD-10-CM | POA: Diagnosis not present

## 2020-09-08 DIAGNOSIS — M6281 Muscle weakness (generalized): Secondary | ICD-10-CM | POA: Diagnosis not present

## 2020-09-08 LAB — CBC WITH DIFFERENTIAL (CANCER CENTER ONLY)
Abs Immature Granulocytes: 0.08 10*3/uL — ABNORMAL HIGH (ref 0.00–0.07)
Basophils Absolute: 0 10*3/uL (ref 0.0–0.1)
Basophils Relative: 0 %
Eosinophils Absolute: 0.1 10*3/uL (ref 0.0–0.5)
Eosinophils Relative: 3 %
HCT: 37.1 % (ref 36.0–46.0)
Hemoglobin: 11.8 g/dL — ABNORMAL LOW (ref 12.0–15.0)
Immature Granulocytes: 2 %
Lymphocytes Relative: 26 %
Lymphs Abs: 1.3 10*3/uL (ref 0.7–4.0)
MCH: 25.2 pg — ABNORMAL LOW (ref 26.0–34.0)
MCHC: 31.8 g/dL (ref 30.0–36.0)
MCV: 79.1 fL — ABNORMAL LOW (ref 80.0–100.0)
Monocytes Absolute: 0.3 10*3/uL (ref 0.1–1.0)
Monocytes Relative: 6 %
Neutro Abs: 3.1 10*3/uL (ref 1.7–7.7)
Neutrophils Relative %: 63 %
Platelet Count: 318 10*3/uL (ref 150–400)
RBC: 4.69 MIL/uL (ref 3.87–5.11)
RDW: 16.9 % — ABNORMAL HIGH (ref 11.5–15.5)
WBC Count: 4.9 10*3/uL (ref 4.0–10.5)
nRBC: 0 % (ref 0.0–0.2)

## 2020-09-08 LAB — RETICULOCYTES
Immature Retic Fract: 16.4 % — ABNORMAL HIGH (ref 2.3–15.9)
RBC.: 4.74 MIL/uL (ref 3.87–5.11)
Retic Count, Absolute: 39.8 10*3/uL (ref 19.0–186.0)
Retic Ct Pct: 0.8 % (ref 0.4–3.1)

## 2020-09-08 NOTE — Telephone Encounter (Signed)
Per 09/08/20 los - gave patient upcoming appointments - confirmed - view my chart

## 2020-09-08 NOTE — Progress Notes (Signed)
Hematology and Oncology Follow Up Visit  Miranda Padilla [id number] 1965-10-30 55 y.o. 09/08/2020   Principle Diagnosis:  Iron deficiency anemia  Sickle cell trait Alpha thalassemia trait Enlarged distal retroperitoneal and pelvic lymph nodes   Current Therapy:        IV iron as indicated  Folic acid 1 mg PO daily   Interim History:  Miranda Padilla is here today with her husband for follow-up. She is doing fairly well. She has chronic lower back and bilateral leg pain and has been seen by neurology for injection in the lumbar spine earlier this month.  She has also started PT during the week and does her own exercises on her off days.  No fever, chills, n/v, cough, rash, dizziness, SOB, chest pain, palpitations, abdominal pain or changes in bowel or bladder habits.  No blood loss noted. No bruising or petechiae.  She has restarted her Methotrexate and Orencia for RA.  She has mild swelling in her ankles. Neuropathy in her feet seems to be a little worse.  No falls or syncope to report. She ambulates with a walker for added support.  She has a good appetite and feels that she is hydrating well throughout the day.  Her weight is stable at 294 lbs.  ECOG Performance Status: 1 - Symptomatic but completely ambulatory  Medications:  Allergies as of 09/08/2020       Reactions   Other Anaphylaxis, Other (See Comments)   Covid 19 vaccine (brand?)   Sulfonamide Derivatives Itching, Rash   Sulfa Antibiotics Itching, Rash        Medication List        Accurate as of September 08, 2020 11:12 AM. If you have any questions, ask your nurse or doctor.          acetaminophen 500 MG tablet Commonly known as: TYLENOL Take 1,000 mg by mouth every 6 (six) hours as needed for mild pain or moderate pain.   Baclofen 5 MG Tabs Take 5 mg by mouth 3 (three) times daily as needed (for muscle spasms).   capsaicin 0.025 % cream Commonly known as: ZOSTRIX Apply topically 2 (two) times  daily.   DULoxetine 20 MG capsule Commonly known as: CYMBALTA Take 1 capsule (20 mg total) by mouth daily.   folic acid 1 MG tablet Commonly known as: FOLVITE Take 1 mg by mouth daily.   furosemide 20 MG tablet Commonly known as: Lasix Take 1 tablet (20 mg total) by mouth daily.   gabapentin 300 MG capsule Commonly known as: NEURONTIN Take 2 capsules (600 mg total) by mouth at bedtime.   gabapentin 300 MG capsule Commonly known as: NEURONTIN Take 1 capsule (300 mg total) by mouth 3 (three) times daily.   lidocaine 4 % cream Commonly known as: LMX Apply topically 2 (two) times daily.   oxyCODONE 5 MG immediate release tablet Commonly known as: Oxy IR/ROXICODONE Take 5 mg by mouth every 6 (six) hours as needed for severe pain or moderate pain.   potassium chloride SA 20 MEQ tablet Commonly known as: KLOR-CON Take 1 tablet (20 mEq total) by mouth daily for 3 days.   predniSONE 1 MG tablet Commonly known as: DELTASONE Take 3 mg by mouth daily with breakfast.   zonisamide 100 MG capsule Commonly known as: ZONEGRAN Take 200 mg by mouth daily.        Allergies:  Allergies  Allergen Reactions   Other Anaphylaxis and Other (See Comments)    Covid 19 vaccine (brand?)  Sulfonamide Derivatives Itching and Rash   Sulfa Antibiotics Itching and Rash    Past Medical History, Surgical history, Social history, and Family History were reviewed and updated.  Review of Systems: All other 10 point review of systems is negative.   Physical Exam:  vitals were not taken for this visit.   Wt Readings from Last 3 Encounters:  07/21/20 (!) 307 lb 11.2 oz (139.6 kg)  07/14/20 (!) 306 lb (138.8 kg)  07/01/20 (!) 307 lb 1.6 oz (139.3 kg)    Ocular: Sclerae unicteric, pupils equal, round and reactive to light Ear-nose-throat: Oropharynx clear, dentition fair Lymphatic: No cervical or supraclavicular adenopathy Lungs no rales or rhonchi, good excursion bilaterally Heart  regular rate and rhythm, no murmur appreciated Abd soft, nontender, positive bowel sounds MSK no focal spinal tenderness, no joint edema Neuro: non-focal, well-oriented, appropriate affect Breasts: Deferred   Lab Results  Component Value Date   WBC 4.9 09/08/2020   HGB 11.8 (L) 09/08/2020   HCT 37.1 09/08/2020   MCV 79.1 (L) 09/08/2020   PLT 318 09/08/2020   Lab Results  Component Value Date   FERRITIN 293 07/14/2020   IRON 49 07/14/2020   TIBC 304 07/14/2020   UIBC 255 07/14/2020   IRONPCTSAT 16 (L) 07/14/2020   Lab Results  Component Value Date   RETICCTPCT 0.8 09/08/2020   RBC 4.74 09/08/2020   RBC 4.69 09/08/2020   No results found for: Nils Pyle [facility] Lab Results  Component Value Date   IGGSERUM 1,722 (H) 07/22/2020   IGA 490 (H) 07/22/2020   IGMSERUM 84 07/22/2020   Lab Results  Component Value Date   TOTALPROTELP 6.5 07/22/2020   TOTALPROTELP 6.8 07/22/2020   ALBUMINELP 3.1 07/22/2020   A1GS 0.2 07/22/2020   A2GS 0.6 07/22/2020   BETS 1.1 07/22/2020   GAMS 1.6 07/22/2020   MSPIKE Not Observed 07/22/2020   SPEI Comment 07/22/2020     Chemistry      Component Value Date/Time   NA 138 07/22/2020 0758   K 3.6 07/22/2020 0758   CL 107 07/22/2020 0758   CO2 24 07/22/2020 0758   BUN 11 07/22/2020 0758   CREATININE 0.74 07/22/2020 0758   CREATININE 0.76 07/14/2020 1208      Component Value Date/Time   CALCIUM 8.6 (L) 07/22/2020 0758   ALKPHOS 61 07/14/2020 1208   AST 17 07/14/2020 1208   ALT 18 07/14/2020 1208   BILITOT 0.7 07/14/2020 1208       Impression and Plan: Miranda Padilla is a very pleasant 55 yo African American female with iron deficiency anemia and sickle cell trait. Iron studies are pending. We will replace if needed.  Follow-up in 4 months.  She can contact our office with any questions or concerns.   Laverna Peace, NP 8/17/202211:12 AM

## 2020-09-08 NOTE — Therapy (Signed)
[facility] [address] [place], [place], [address] Phone: [phone]   Fax:  [phone]  Physical Therapy Treatment  Patient Details  Name: Miranda Padilla MRN: [id number] Date of Birth: 1965-09-20 Referring Provider (PT): (Dr. Corey Harold   Encounter Date: 09/08/2020   PT End of Session - 09/08/20 1845     Visit Number 10    Number of Visits 13    Date for PT Re-Evaluation 09/10/20    Authorization Type U[employer]    PT Start Time 6789    PT Stop Time 3810    PT Time Calculation (min) 42 min    Equipment Utilized During Treatment Gait belt    Activity Tolerance Patient tolerated treatment well;No increased pain    Behavior During Therapy WFL for tasks assessed/performed             Past Medical History:  Diagnosis Date   Abdominal distension    Abdominal pain    Anemia    Anxiety    Arthritis    Asthma    Bronchitis    Constipation    Cough    Depression    Family history of adverse reaction to anesthesia    Pt mother would wake up during procedures   GERD (gastroesophageal reflux disease)    Headache(784.0)    Nausea & vomiting    Obsessive compulsive disorder    not on any medication at this time   Sickle cell trait ([place])    Thalassemia     Past Surgical History:  Procedure Laterality Date   AXILLARY LYMPH NODE BIOPSY Right 07/01/2020   Procedure: EXCISIONAL BIOPSY RIGHT AXILLARY LYMPH NODE;  Surgeon: Coralie Keens, MD;  Location: [facility];  Service: General;  Laterality: Right;   CHOLECYSTECTOMY  02/03/2011   Procedure: LAPAROSCOPIC CHOLECYSTECTOMY;  Surgeon: Harl Bowie, MD;  Location: [facility];  Service: General;  Laterality: N/A;  Laparoscopic Choleystectomy   COLONOSCOPY WITH PROPOFOL N/A 06/01/2016   Procedure: COLONOSCOPY WITH PROPOFOL;  Surgeon: Ronnette Juniper, MD;  Location: [facility];  Service: Gastroenterology;  Laterality: N/A;   MOUTH SURGERY  2000     There were no vitals filed for this visit.   Subjective Assessment - 09/08/20 1753     Subjective Still having a little pain in the back of thigh and heel.  Foot and ankle seem to be moving better.  It's going to be good to work on my balance.  I am trying to stand to work with cooking, up to about 5 minutes.    Patient is accompained by: Family member   spouse   Pertinent History Had been going thorugh assessment of lymph nodes to r/o cancer (no cancer per pt report); NCV normal    Limitations Standing;Walking;House hold activities    Diagnostic tests MRI mild progression of L recess/foraminal protrusion of L3-4    Patient Stated Goals Pt's goals are to get back to walking independently.    Currently in Pain? Yes    Pain Score 5     Pain Location Back    Pain Orientation Right;Left;Lower    Pain Descriptors / Indicators Aching;Sore    Pain Type Acute pain;Neuropathic pain    Pain Onset More than a month ago    Pain Frequency Intermittent    Aggravating Factors  certain movements    Pain Relieving Factors recent injection, stretching    Multiple Pain Sites Yes    Pain Score 5    Pain  Location Foot    Pain Orientation Right;Left    Pain Descriptors / Indicators Tingling;Numbness    Pain Type Acute pain;Neuropathic pain    Pain Onset More than a month ago    Pain Frequency Constant    Aggravating Factors  unsure    Pain Relieving Factors warmth                               [facility] Adult PT Treatment/Exercise - 09/08/20 0001       Ambulation/Gait   Ambulation/Gait Yes    Ambulation/Gait Assistance 5: Supervision    Ambulation Distance (Feet) 80 Feet   x 2 with RW, 20 ft with cane and HHA   Assistive device Rolling walker;Straight cane    Gait Pattern Step-through pattern;Decreased step length - right;Decreased step length - left;Decreased stride length;Decreased hip/knee flexion - right;Decreased hip/knee flexion - left;Decreased dorsiflexion -  right;Decreased dorsiflexion - left;Right foot flat;Left foot flat;Wide base of support    Ambulation Surface Level;Indoor    Gait velocity 12.69 sec =2.58 ft/sec    Gait Comments Gait at counter, holding counter with LUE and using cane with RUE, using mirror for visual feedback, 3 reps (with 3 reps of backwards gait, with visual cues for L foot placement)                 Balance Exercises - 09/08/20 0001       Balance Exercises: Standing   Stepping Strategy Anterior;Posterior;Lateral;UE support;10 reps;Limitations    Stepping Strategy Limitations Pt with ataxic placmeent of LLE with step strategy; transitioned to using flat balance disks for targets and pt able to improve control of LLE.    Other Standing Exercises Heel raises/toe raises x 10 reps for limits of stability, then anterior/posterior weigthshfiting through hips x 10-15 reps at counter, for hip strategy work.  Explained reasoning behind ankle/hip/step strategy use for balance.  Also explained use of vision to help to know where feet are being placed with gait.            Reviewed additions to HEP from last visit, with pt return demo understnading.  Therapeutic Activity:  Discussed the following, including POC, recert after next visit this week, progress with therapy,and benefits of focusing on balance and progression of gait with lesser device.  Pt agreeable and scheduled more appts.  PT Education - 09/08/20 1844     Education Details progress towrads goals; recert for PT for 4 additional weeks; discussed using vision to assist with knowing where feet are being placed with gait    Person(s) Educated Patient;Spouse    Methods Explanation;Demonstration    Comprehension Verbalized understanding;Returned demonstration              PT Short Term Goals - 08/18/20 0856       PT SHORT TERM GOAL #1   Title Pt will be independent with HEP for improved flexibility, strength, decreased pain, and gait.  TARGET 08/20/2020     Time 3    Period Weeks    Status Achieved      PT SHORT TERM GOAL #2   Title Pt will report at least 3 means to decrease pain in low back and lower extremities.    Baseline reports 6/10 pain at eval; pt reports positioning, heat, some gentle exercises    Time 3    Period Weeks    Status Achieved      PT SHORT TERM  GOAL #3   Title Pt will improve SLR test bilaterally by 5 degrees bilaterally before onset of pain.    Baseline LLE 32 degrees, RLE 30 degrees    Time 3    Period Weeks    Status New      PT SHORT TERM GOAL #4   Title Pt will perform at least 4 of 5 sit<>stand transfers with minimal to no UE support, indicating increased functional lower extremity strength.    Baseline still requires UE support, receptive to performing with technique to lessen UE support 08/18/2020    Time 3    Period Weeks    Status Not Met               PT Long Term Goals - 09/08/20 1847       PT LONG TERM GOAL #1   Title Pt will be independent with HEP for improved flexibility,strength, pain, transfers, and gait.  TARGET 09/10/2020    Baseline assessed for new additions to HEP 09/08/20, with pt meeting this goal; she could benefit from additional standing exercises as therapy progresses    Time 6    Period Weeks    Status Achieved      PT LONG TERM GOAL #2   Title Gait velocity to improve, with pt ambulating 32.8 ft in less than or equal to 45 seconds.  GOAL REVISION:  Pt to improve gait velocity to at least 1.8 ft/sec for improved gait efficiency and safety.    Baseline > 1 minute at eval; 08/27/20 initial goal achieved with pt ambulating 32.8 ft in 19.94 seconds.  09/08/20: 2.58 ft/sec with RW    Time 6    Period Weeks    Status Achieved      PT LONG TERM GOAL #3   Title Pt will ambulate at least 50-100 ft, supervision, with cane, for imrpoved household mobility.    Time 6    Period Weeks    Status New      PT LONG TERM GOAL #4   Title Further balance testing to be assessed, as it  was unable to be completed at time of eval due to pt's sensitivity to pain with activities/time constraints.  GOAL UPDATE:  Merrilee Jansky to improve 38/56 for decreased fall risk.    Baseline Merrilee Jansky 30/56 08/27/2020    Time 2    Period Weeks    Status New                   Plan - 09/08/20 1848     Clinical Impression Statement Began to assess LTGs this visit with pt meeting LTG 1 and 2.  Focused remainder of session on balance strategies as well as using vision to know where BLES (especially LLE) are with gait and dynamic balance exercises.  She responds well to use of visual target and use of mirror to improve visual cues for improved feedback for balance.  At times, LLE has unpredictable and ataxic movements, and this is lessened by use of visual cues.  Pt able to take short distances of gait with HHA or assist of counter with use of cane today.  She will continue to benefit from skilled PT to fruther address balance, progression of standing and gait stability for improved independence, as pt is ultimately wanting to return to work without walker.    Personal Factors and Comorbidities Comorbidity 3+    Comorbidities PMH:  chronic lumbar radiculopathy, RA, GERD, obesity, lymphadenopathy, 07/22/20:MRI showed mild progression  of L recess/foraminal protrusion at L3-4    Examination-Activity Limitations Locomotion Level;Transfers;Stand    Examination-Participation Restrictions Occupation;Community Activity    Stability/Clinical Decision Making Evolving/Moderate complexity    Rehab Potential Good    PT Frequency 2x / week    PT Duration 6 weeks   including eval week   PT Treatment/Interventions ADLs/Self Care Home Management;Gait training;Electrical Stimulation;Cryotherapy;DME Instruction;Neuromuscular re-education;Balance training;Therapeutic exercise;Therapeutic activities;Functional mobility training;Moist Heat;Traction;Ultrasound;Patient/family education;Manual techniques;Passive range of motion    PT  Next Visit Plan Pt scheduled additional visit this week on 8/19, so please check remaining LTGs and send to PT for recert after that visit.  Work on standing balance, balance strategies, progression of gait with cane; use visual feedback of targets/mirror to help with LLE placement and decreased ataxic movements.    PT Home Exercise Plan [email or web address] [email or web address]: [email or web address]    Consulted and Agree with Plan of Care Patient;Family member/caregiver    Family Member Consulted husband             Patient will benefit from skilled therapeutic intervention in order to improve the following deficits and impairments:  Abnormal gait, Decreased balance, Decreased range of motion, Difficulty walking, Pain, Postural dysfunction, Decreased strength, Decreased mobility  Visit Diagnosis: Other abnormalities of gait and mobility  Muscle weakness (generalized)     Problem List Patient Active Problem List   Diagnosis Date Noted   Impaired ambulation 07/21/2020   Lumbosacral radiculopathy    Weakness of both lower extremities    IDA (iron deficiency anemia) 05/24/2020   Symptomatic cholelithiasis 01/31/2011   ALLERGIC RHINITIS 07/02/2008   HEADACHE, CHRONIC 07/02/2008   DYSPNEA 07/02/2008   COUGH 07/02/2008   OBESITY 07/01/2008   SICKLE CELL TRAIT 07/01/2008   OBSESSIVE-COMPULSIVE DISORDER 07/01/2008   DEPRESSION 07/01/2008    Fread Kottke W. 09/08/2020, 6:54 PM Mady Haagensen, PT 09/08/20 6:56 PM Phone: [phone] Fax: [facility] [address] [place], [place], [address] Phone: [phone]   Fax:  [phone]  Name: Miranda Padilla MRN: [id number] Date of Birth: Nov 04, 1965

## 2020-09-09 ENCOUNTER — Encounter: Payer: Self-pay | Admitting: *Deleted

## 2020-09-09 LAB — IRON AND TIBC
Iron: 56 ug/dL (ref 41–142)
Saturation Ratios: 19 % — ABNORMAL LOW (ref 21–57)
TIBC: 300 ug/dL (ref 236–444)
UIBC: 244 ug/dL (ref 120–384)

## 2020-09-09 LAB — FERRITIN: Ferritin: 355 ng/mL — ABNORMAL HIGH (ref 11–307)

## 2020-09-10 ENCOUNTER — Encounter: Payer: Self-pay | Admitting: Physical Therapy

## 2020-09-10 ENCOUNTER — Other Ambulatory Visit: Payer: Self-pay

## 2020-09-10 ENCOUNTER — Ambulatory Visit: Payer: 59 | Admitting: Physical Therapy

## 2020-09-10 DIAGNOSIS — M6281 Muscle weakness (generalized): Secondary | ICD-10-CM | POA: Diagnosis not present

## 2020-09-10 DIAGNOSIS — R2689 Other abnormalities of gait and mobility: Secondary | ICD-10-CM

## 2020-09-10 NOTE — Therapy (Addendum)
[facility] [address] [place], [place], [address] Phone: [phone]   Fax:  [phone]  Physical Therapy Treatment/Recert  Patient Details  Name: Miranda Padilla MRN: [id number] Date of Birth: January 30, 1965 Referring Provider (PT): (Dr. Corey Harold   Encounter Date: 09/10/2020   PT End of Session - 09/10/20 1022     Visit Number 11    Number of Visits 13    Date for PT Re-Evaluation 09/10/20    Authorization Type UHC    PT Start Time 1017    PT Stop Time 1100    PT Time Calculation (min) 43 min    Equipment Utilized During Treatment Gait belt    Activity Tolerance Patient tolerated treatment well;No increased pain    Behavior During Therapy WFL for tasks assessed/performed             Past Medical History:  Diagnosis Date   Abdominal distension    Abdominal pain    Anemia    Anxiety    Arthritis    Asthma    Bronchitis    Constipation    Cough    Depression    Family history of adverse reaction to anesthesia    Pt mother would wake up during procedures   GERD (gastroesophageal reflux disease)    Headache(784.0)    Nausea & vomiting    Obsessive compulsive disorder    not on any medication at this time   Sickle cell trait ([place])    Thalassemia     Past Surgical History:  Procedure Laterality Date   AXILLARY LYMPH NODE BIOPSY Right 07/01/2020   Procedure: EXCISIONAL BIOPSY RIGHT AXILLARY LYMPH NODE;  Surgeon: Coralie Keens, MD;  Location: [facility];  Service: General;  Laterality: Right;   CHOLECYSTECTOMY  02/03/2011   Procedure: LAPAROSCOPIC CHOLECYSTECTOMY;  Surgeon: Harl Bowie, MD;  Location: [facility];  Service: General;  Laterality: N/A;  Laparoscopic Choleystectomy   COLONOSCOPY WITH PROPOFOL N/A 06/01/2016   Procedure: COLONOSCOPY WITH PROPOFOL;  Surgeon: Ronnette Juniper, MD;  Location: [facility];  Service: Gastroenterology;  Laterality: N/A;   MOUTH SURGERY  2000     There were no vitals filed for this visit.   Subjective Assessment - 09/10/20 1020     Subjective No new complaints. No falls. Does report some right shin/medial burning from overdoing resisted eversion at home.    Patient is accompained by: Family member   spouse   Pertinent History Had been going thorugh assessment of lymph nodes to r/o cancer (no cancer per pt report); NCV normal    Limitations Standing;Walking;House hold activities    Diagnostic tests MRI mild progression of L recess/foraminal protrusion of L3-4    Patient Stated Goals Pt's goals are to get back to walking independently.    Currently in Pain? Yes    Pain Score 4     Pain Orientation Right;Left;Lower    Pain Descriptors / Indicators Aching;Sore    Pain Type Acute pain;Neuropathic pain    Pain Onset More than a month ago    Pain Frequency Intermittent    Aggravating Factors  certain movements    Pain Relieving Factors stretching, recent injection                  [facility] Adult PT Treatment/Exercise - 09/10/20 1024       Transfers   Transfers Sit to Stand;Stand to Sit    Sit to Stand 5: Supervision;With upper extremity assist;From bed;From chair/3-in-1  Stand to Sit 5: Supervision;With upper extremity assist;To bed;To chair/3-in-1      Ambulation/Gait   Ambulation/Gait Yes    Ambulation/Gait Assistance 4: Min guard;4: Min assist;6: Modified independent (Device/Increase time)    Ambulation/Gait Assistance Details Mod I with RW to enter/exit session. use of cane with min guard to min assist with HHA opposite cane. pt intermittently using PTA hand with gait.    Ambulation Distance (Feet) 115 Feet   x1 with cane/HHA   Assistive device Rolling walker;Straight cane    Gait Pattern Step-through pattern;Decreased step length - right;Decreased step length - left;Decreased stride length;Decreased hip/knee flexion - right;Decreased hip/knee flexion - left;Decreased dorsiflexion - right;Decreased dorsiflexion -  left;Right foot flat;Left foot flat;Wide base of support    Ambulation Surface Level;Indoor      Berg Balance Test   Sit to Stand Able to stand without using hands and stabilize independently    Standing Unsupported Able to stand 2 minutes with supervision   increased postural sway with arms out to side at times to assist with balance catching   Sitting with Back Unsupported but Feet Supported on Floor or Stool Able to sit safely and securely 2 minutes    Stand to Sit Controls descent by using hands    Transfers Able to transfer safely, definite need of hands    Standing Unsupported with Eyes Closed Able to stand 3 seconds   posterior balance loss with mod assist just after 10 sec mark, increased postural sway with right arm reaching out for surface at 10 sec mark   Standing Ubsupported with Feet Together Able to place feet together independently but unable to hold for 30 seconds   22.68 sec's before loss of balance   From Standing, Reach Forward with Outstretched Arm Can reach confidently >25 cm (10")   10 inches   From Standing Position, Pick up Object from Floor Able to pick up shoe, needs supervision    From Standing Position, Turn to Look Behind Over each Shoulder Looks behind one side only/other side shows less weight shift   right>left side   Turn 360 Degrees Needs close supervision or verbal cueing    Standing Unsupported, Alternately Place Feet on Step/Stool Able to complete >2 steps/needs minimal assist    Standing Unsupported, One Foot in Front Able to take small step independently and hold 30 seconds    Standing on One Leg Tries to lift leg/unable to hold 3 seconds but remains standing independently    Total Score 36      Neuro Re-ed    Neuro Re-ed Details  for balance/NMR: standing on solid floor with 3 foam bubbles in semi circle on floor- alternating LEs for foot taps across for 5 laps each side. min guard to min assist. increased difficulty with tapping with left foot.                       PT Short Term Goals - 08/18/20 0856       PT SHORT TERM GOAL #1   Title Pt will be independent with HEP for improved flexibility, strength, decreased pain, and gait.  TARGET 08/20/2020    Time 3    Period Weeks    Status Achieved      PT SHORT TERM GOAL #2   Title Pt will report at least 3 means to decrease pain in low back and lower extremities.    Baseline reports 6/10 pain at eval; pt reports positioning, heat, some  gentle exercises    Time 3    Period Weeks    Status Achieved      PT SHORT TERM GOAL #3   Title Pt will improve SLR test bilaterally by 5 degrees bilaterally before onset of pain.    Baseline LLE 32 degrees, RLE 30 degrees    Time 3    Period Weeks    Status New      PT SHORT TERM GOAL #4   Title Pt will perform at least 4 of 5 sit<>stand transfers with minimal to no UE support, indicating increased functional lower extremity strength.    Baseline still requires UE support, receptive to performing with technique to lessen UE support 08/18/2020    Time 3    Period Weeks    Status Not Met               PT Long Term Goals - 09/10/20 1023       PT LONG TERM GOAL #1   Title Pt will be independent with HEP for improved flexibility,strength, pain, transfers, and gait.  TARGET 09/10/2020    Baseline 09/08/20, with pt meeting this goal; she could benefit from additional standing exercises as therapy progresses    Status Achieved      PT LONG TERM GOAL #2   Title GOAL REVISION:  Pt to improve gait velocity to at least 1.8 ft/sec for improved gait efficiency and safety.    Baseline 09/08/20: 2.58 ft/sec with RW    Status Achieved      PT LONG TERM GOAL #3   Title Pt will ambulate at least 50-100 ft, supervision, with cane, for imrpoved household mobility.    Baseline 09/10/20: with min HHA  for 115 feet with straight cane    Time --    Period --    Status Partially Met      PT LONG TERM GOAL #4   Title Further balance testing to be  assessed, as it was unable to be completed at time of eval due to pt's sensitivity to pain with activities/time constraints.  GOAL UPDATE:  Merrilee Jansky to improve 38/56 for decreased fall risk.    Baseline 09/10/20: 36/56 scored today, improved from 30/56 just not to goal level    Time --    Period --    Status Partially Met                   Plan - 09/10/20 1022     Clinical Impression Statement Today's skilled session focused on progress toward remaining LTGs for recert. Pt able to go 115 feet with cane, however not at supervision level as she needs up to min assist at this time. Of note, just started with use of cane this week. Pt also improved her Berg Balance Test score to 36-56. Improved from 30/56, just not to goal level. Remainder of session address standing balance with use of visual targets for increased control with LE movements due to ataxia. No issues noted or reported in session. The pt should benefit from continued PT with primary PT planning to recert.    Personal Factors and Comorbidities Comorbidity 3+    Comorbidities PMH:  chronic lumbar radiculopathy, RA, GERD, obesity, lymphadenopathy, 07/22/20:MRI showed mild progression of L recess/foraminal protrusion at L3-4    Examination-Activity Limitations Locomotion Level;Transfers;Stand    Examination-Participation Restrictions Occupation;Community Activity    Stability/Clinical Decision Making Evolving/Moderate complexity    Rehab Potential Good    PT Frequency 2x /  week    PT Duration 6 weeks   including eval week   PT Treatment/Interventions ADLs/Self Care Home Management;Gait training;Electrical Stimulation;Cryotherapy;DME Instruction;Neuromuscular re-education;Balance training;Therapeutic exercise;Therapeutic activities;Functional mobility training;Moist Heat;Traction;Ultrasound;Patient/family education;Manual techniques;Passive range of motion    PT Next Visit Plan Work on standing balance, balance strategies, progression of  gait with cane; use visual feedback of targets/mirror to help with LLE placement and decreased ataxic movements.    PT Home Exercise Plan MedbridgeAccess Code: [email or web address]    Consulted and Agree with Plan of Care Patient;Family member/caregiver    Family Member Consulted husband             Patient will benefit from skilled therapeutic intervention in order to improve the following deficits and impairments:  Abnormal gait, Decreased balance, Decreased range of motion, Difficulty walking, Pain, Postural dysfunction, Decreased strength, Decreased mobility  Visit Diagnosis: Other abnormalities of gait and mobility  Muscle weakness (generalized)     Problem List Patient Active Problem List   Diagnosis Date Noted   Impaired ambulation 07/21/2020   Lumbosacral radiculopathy    Weakness of both lower extremities    IDA (iron deficiency anemia) 05/24/2020   Symptomatic cholelithiasis 01/31/2011   ALLERGIC RHINITIS 07/02/2008   HEADACHE, CHRONIC 07/02/2008   DYSPNEA 07/02/2008   COUGH 07/02/2008   OBESITY 07/01/2008   SICKLE CELL TRAIT 07/01/2008   OBSESSIVE-COMPULSIVE DISORDER 07/01/2008   DEPRESSION 07/01/2008    Willow Ora, PTA, [facility] Outpatient Neuro [facility] [address] [place], [place] [address] [phone] 09/10/20, 5:02 PM   Name: Miranda Padilla MRN: [id number] Date of Birth: [birth year]   For REcert:   PT End of Session - 09/13/20 1508     Visit Number 52   (addendum from 09/10/20)   Number of Visits 19    Date for PT Re-Evaluation 10/11/20    Authorization Type UHC    Equipment Utilized During Treatment Gait belt    Activity Tolerance Patient tolerated treatment well;No increased pain    Behavior During Therapy [facility] for tasks assessed/performed              09/10/20 1022  Plan  Clinical Impression Statement Today's skilled session focused on progress toward remaining LTGs for recert. Pt able to go 115 feet with cane, however not at  supervision level as she needs up to min assist at this time. Of note, just started with use of cane this week. Pt also improved her Berg Balance Test score to 36-56. Improved from 30/56, just not to goal level. Remainder of session address standing balance with use of visual targets for increased control with LE movements due to ataxia. No issues noted or reported in session. The pt should benefit from continued PT with primary PT planning to recert.  Personal Factors and Comorbidities Comorbidity 3+  Comorbidities PMH:  chronic lumbar radiculopathy, RA, GERD, obesity, lymphadenopathy, 07/22/20:MRI showed mild progression of L recess/foraminal protrusion at L3-4  Examination-Activity Limitations Locomotion Level;Transfers;Stand  Examination-Participation Restrictions Occupation;Community Activity  Pt will benefit from skilled therapeutic intervention in order to improve on the following deficits Abnormal gait;Decreased balance;Decreased range of motion;Difficulty walking;Pain;Postural dysfunction;Decreased strength;Decreased mobility  Stability/Clinical Decision Making Evolving/Moderate complexity  Rehab Potential Good  PT Frequency 2x / week  PT Duration 4 weeks (per recert 8/78/67)  PT Treatment/Interventions ADLs/Self Care Home Management;Gait training;Electrical Stimulation;Cryotherapy;DME Instruction;Neuromuscular re-education;Balance training;Therapeutic exercise;Therapeutic activities;Functional mobility training;Moist Heat;Traction;Ultrasound;Patient/family education;Manual techniques;Passive range of motion  PT Next Visit Plan Work on standing balance, balance strategies, progression of gait with cane; use visual  feedback of targets/mirror to help with LLE placement and decreased ataxic movements.  PT Home Exercise Plan MedbridgeAccess Code: [email or web address]  Consulted and Agree with Plan of Care Patient;Family member/caregiver  Family Member Consulted husband     PT Long Term Goals - 09/13/20  1512       PT LONG TERM GOAL #1   Title Pt will be independent with progression of HEP for improved flexibility,strength, pain, transfers, and gait.  TARGET 10/08/20    Baseline 09/08/20, with pt meeting this goal; she could benefit from additional standing exercises as therapy progresses    Time 4    Period Weeks    Status Revised      PT LONG TERM GOAL #2   Title Pt will improve gait velocity to at least 2.62 ft/sec with RW or cane for improved gait efficiency and safety.    Baseline 09/08/20: 2.58 ft/sec with RW    Time 4    Period Weeks    Status Revised      PT LONG TERM GOAL #3   Title Pt will ambulate at least 100 ft, supervision, with cane, for imrpoved household mobility.    Baseline 09/10/20: with min HHA  for 115 feet with straight cane    Time 4    Period Weeks    Status Revised      PT LONG TERM GOAL #4   Title Berg score to improve to at least 40/56 for decreased fall risk.    Baseline 09/10/20: 36/56 scored today, improved from 30/56 just not to goal level    Time 4    Period Weeks    Status Revised            Mady Haagensen, PT 09/13/20 3:16 PM Phone: [phone] Fax: [phone]

## 2020-09-13 NOTE — Addendum Note (Signed)
Addended by: Frazier Butt on: 09/13/2020 03:19 PM   Modules accepted: Orders

## 2020-09-15 ENCOUNTER — Encounter: Payer: Self-pay | Admitting: Physical Therapy

## 2020-09-15 ENCOUNTER — Other Ambulatory Visit: Payer: Self-pay

## 2020-09-15 ENCOUNTER — Ambulatory Visit: Payer: 59 | Admitting: Physical Therapy

## 2020-09-15 DIAGNOSIS — R2689 Other abnormalities of gait and mobility: Secondary | ICD-10-CM

## 2020-09-15 DIAGNOSIS — M6281 Muscle weakness (generalized): Secondary | ICD-10-CM

## 2020-09-15 DIAGNOSIS — R293 Abnormal posture: Secondary | ICD-10-CM

## 2020-09-16 NOTE — Therapy (Signed)
[facility] [address] [place], [place], [address] Phone: [phone]   Fax:  [phone]  Physical Therapy Treatment  Patient Details  Name: Miranda Padilla MRN: [id number] Date of Birth: 07-21-1965 Referring Provider (PT): (Dr. Corey Harold   Encounter Date: 09/15/2020   PT End of Session - 09/15/20 1110     Visit Number 12    Number of Visits 19    Date for PT Re-Evaluation 10/11/20    Authorization Type UHC    PT Start Time [id number]    PT Stop Time 1145    PT Time Calculation (min) 40 min    Equipment Utilized During Treatment Gait belt    Activity Tolerance Patient tolerated treatment well;No increased pain    Behavior During Therapy WFL for tasks assessed/performed             Past Medical History:  Diagnosis Date   Abdominal distension    Abdominal pain    Anemia    Anxiety    Arthritis    Asthma    Bronchitis    Constipation    Cough    Depression    Family history of adverse reaction to anesthesia    Pt mother would wake up during procedures   GERD (gastroesophageal reflux disease)    Headache(784.0)    Nausea & vomiting    Obsessive compulsive disorder    not on any medication at this time   Sickle cell trait ([place])    Thalassemia     Past Surgical History:  Procedure Laterality Date   AXILLARY LYMPH NODE BIOPSY Right 07/01/2020   Procedure: EXCISIONAL BIOPSY RIGHT AXILLARY LYMPH NODE;  Surgeon: Coralie Keens, MD;  Location: [facility];  Service: General;  Laterality: Right;   CHOLECYSTECTOMY  02/03/2011   Procedure: LAPAROSCOPIC CHOLECYSTECTOMY;  Surgeon: Harl Bowie, MD;  Location: [facility];  Service: General;  Laterality: N/A;  Laparoscopic Choleystectomy   COLONOSCOPY WITH PROPOFOL N/A 06/01/2016   Procedure: COLONOSCOPY WITH PROPOFOL;  Surgeon: Ronnette Juniper, MD;  Location: [facility];  Service: Gastroenterology;  Laterality: N/A;   MOUTH SURGERY  2000     There were no vitals filed for this visit.   Subjective Assessment - 09/15/20 1107     Subjective No new complaints. No falls. Having issues with hives for the past week. Not sure what is causing them. See's her RA doctor tomorrow.    Patient is accompained by: Family member   spouse   Pertinent History Had been going thorugh assessment of lymph nodes to r/o cancer (no cancer per pt report); NCV normal    Limitations Standing;Walking;House hold activities    Diagnostic tests MRI mild progression of L recess/foraminal protrusion of L3-4    Patient Stated Goals Pt's goals are to get back to walking independently.    Currently in Pain? Yes    Pain Score 2     Pain Location Back    Pain Orientation Right;Left;Lower    Pain Descriptors / Indicators Aching;Sore    Pain Type Acute pain;Neuropathic pain    Pain Onset More than a month ago    Pain Frequency Intermittent    Aggravating Factors  certain movements    Pain Relieving Factors stretching, recent injections    Multiple Pain Sites Yes    Pain Score 6    Pain Location Foot   outside of right foot, toes of left foot   Pain Orientation Right;Left    Pain Descriptors /  Indicators Aching;Sore;Burning;Tingling   buring/tingling on left toes   Pain Type Acute pain;Neuropathic pain    Pain Onset More than a month ago    Pain Frequency Constant    Aggravating Factors  unsure    Pain Relieving Factors warmth               09/15/20 1111  Transfers  Transfers Sit to Stand;Stand to Sit  Sit to Stand 5: Supervision;With upper extremity assist;From bed;From chair/3-in-1  Stand to Sit 5: Supervision;With upper extremity assist;To bed;To chair/3-in-1  Ambulation/Gait  Ambulation/Gait Yes  Ambulation/Gait Assistance 4: Min guard;4: Min assist  Ambulation/Gait Assistance Details cues for posture, cane position, step length and for more narrowed BOS. HHA on opposite side of cane.  Ambulation Distance (Feet) 70 Feet (x2)  Assistive  device Straight cane  Gait Pattern Step-through pattern;Decreased step length - right;Decreased step length - left;Decreased stride length;Decreased hip/knee flexion - right;Decreased hip/knee flexion - left;Decreased dorsiflexion - right;Decreased dorsiflexion - left;Right foot flat;Left foot flat;Wide base of support  Ambulation Surface Level;Indoor  High Level Balance  High Level Balance Activities Side stepping;Marching forwards;Marching backwards;Tandem walking  High Level Balance Comments on blue mat in parallel bars with UE support for 3 laps each. cues on posture and ex form/technique.       09/15/20 1121  Balance Exercises: Standing  Standing Eyes Closed Wide (BOA);Head turns;Foam/compliant surface;Other reps (comment);30 secs;Limitations  Standing Eyes Closed Limitations on airex with no UE support with feet hip width apart: EC 30 sec's x 3 reps with min guard to min assist for balance. cues on posture and weight shifting to assist with balance.  SLS with Vectors Solid surface;Foam/compliant surface;Upper extremity assist 2;Other reps (comment);Limitations (light fingertip support only on bars)  SLS with Vectors Limitations floor> on airex with 2 foam bubbles- progressed from solid surface to on airex for alternating forward foot taps, then alternating cross foot taps for ~10 reps each to targets. cues on base of support, weight shifting and posture. no to light UE support for balance with min guard to min assist for balance.          PT Short Term Goals - 09/13/20 1510       PT SHORT TERM GOAL #1   Title STGs= LTGs               PT Long Term Goals - 09/13/20 1512       PT LONG TERM GOAL #1   Title Pt will be independent with progression of HEP for improved flexibility,strength, pain, transfers, and gait.  TARGET 10/08/20    Baseline 09/08/20, with pt meeting this goal; she could benefit from additional standing exercises as therapy progresses    Time 4    Period  Weeks    Status Revised      PT LONG TERM GOAL #2   Title Pt will improve gait velocity to at least 2.62 ft/sec with RW or cane for improved gait efficiency and safety.    Baseline 09/08/20: 2.58 ft/sec with RW    Time 4    Period Weeks    Status Revised      PT LONG TERM GOAL #3   Title Pt will ambulate at least 100 ft, supervision, with cane, for imrpoved household mobility.    Baseline 09/10/20: with min HHA  for 115 feet with straight cane    Time 4    Period Weeks    Status Revised      PT LONG  TERM GOAL #4   Title Berg score to improve to at least 40/56 for decreased fall risk.    Baseline 09/10/20: 36/56 scored today, improved from 30/56 just not to goal level    Time 4    Period Weeks    Status Revised               09/15/20 1111  Plan  Clinical Impression Statement Today's skilled session continued to focus on gait training with cane and balance training on complaint surfaces. No significant issues noted or reported in session. The pt is progressing toward goals and should benefit from continued PT to progress toward unmet goals.  Personal Factors and Comorbidities Comorbidity 3+  Comorbidities PMH:  chronic lumbar radiculopathy, RA, GERD, obesity, lymphadenopathy, 07/22/20:MRI showed mild progression of L recess/foraminal protrusion at L3-4  Examination-Activity Limitations Locomotion Level;Transfers;Stand  Examination-Participation Restrictions Occupation;Community Activity  Pt will benefit from skilled therapeutic intervention in order to improve on the following deficits Abnormal gait;Decreased balance;Decreased range of motion;Difficulty walking;Pain;Postural dysfunction;Decreased strength;Decreased mobility  Stability/Clinical Decision Making Evolving/Moderate complexity  Rehab Potential Good  PT Frequency 2x / week  PT Duration 4 weeks (per recert [id number])  PT Treatment/Interventions ADLs/Self Care Home Management;Gait training;Electrical  Stimulation;Cryotherapy;DME Instruction;Neuromuscular re-education;Balance training;Therapeutic exercise;Therapeutic activities;Functional mobility training;Moist Heat;Traction;Ultrasound;Patient/family education;Manual techniques;Passive range of motion  PT Next Visit Plan Work on standing balance, balance strategies, progression of gait with cane; use visual feedback of targets/mirror to help with LLE placement and decreased ataxic movements.  PT Home Exercise Plan MedbridgeAccess Code: [email or web address]  Consulted and Agree with Plan of Care Patient;Family member/caregiver  Family Member Consulted husband          Patient will benefit from skilled therapeutic intervention in order to improve the following deficits and impairments:  Abnormal gait, Decreased balance, Decreased range of motion, Difficulty walking, Pain, Postural dysfunction, Decreased strength, Decreased mobility  Visit Diagnosis: Other abnormalities of gait and mobility  Muscle weakness (generalized)  Abnormal posture     Problem List Patient Active Problem List   Diagnosis Date Noted   Impaired ambulation 07/21/2020   Lumbosacral radiculopathy    Weakness of both lower extremities    IDA (iron deficiency anemia) 05/24/2020   Symptomatic cholelithiasis 01/31/2011   ALLERGIC RHINITIS 07/02/2008   HEADACHE, CHRONIC 07/02/2008   DYSPNEA 07/02/2008   COUGH 07/02/2008   OBESITY 07/01/2008   SICKLE CELL TRAIT 07/01/2008   OBSESSIVE-COMPULSIVE DISORDER 07/01/2008   DEPRESSION 07/01/2008    Willow Ora, PTA, [facility] Outpatient Neuro [facility] [address] [place], [place] [address] [phone] 09/16/20, 8:31 PM   Name: Lesha Youngkin MRN: [id number] Date of Birth: 03/21/1965

## 2020-09-17 ENCOUNTER — Encounter: Payer: Self-pay | Admitting: Physical Therapy

## 2020-09-17 ENCOUNTER — Ambulatory Visit: Payer: 59 | Admitting: Physical Therapy

## 2020-09-17 ENCOUNTER — Other Ambulatory Visit: Payer: Self-pay

## 2020-09-17 DIAGNOSIS — R2689 Other abnormalities of gait and mobility: Secondary | ICD-10-CM

## 2020-09-17 DIAGNOSIS — M6281 Muscle weakness (generalized): Secondary | ICD-10-CM | POA: Diagnosis not present

## 2020-09-17 NOTE — Patient Instructions (Signed)
  Using your cane, with your husband's supervision:  -Walk along the hallway in your home, 2 laps, 2-3 times per day  -When you turn around, stop briefly to make sure you have your balance before you start walking.

## 2020-09-17 NOTE — Therapy (Signed)
[facility] [address] [place], [place], [address] Phone: [phone]   Fax:  [phone]  Physical Therapy Treatment  Patient Details  Name: Miranda Padilla MRN: [id number] Date of Birth: July 15, 1965 Referring Provider (PT): (Dr. Corey Harold   Encounter Date: 09/17/2020   PT End of Session - 09/17/20 1145     Visit Number 13    Number of Visits 19    Date for PT Re-Evaluation 10/11/20    Authorization Type UHC    PT Start Time 1107    PT Stop Time 1146    PT Time Calculation (min) 39 min    Equipment Utilized During Treatment Gait belt    Activity Tolerance Patient tolerated treatment well;No increased pain    Behavior During Therapy WFL for tasks assessed/performed             Past Medical History:  Diagnosis Date   Abdominal distension    Abdominal pain    Anemia    Anxiety    Arthritis    Asthma    Bronchitis    Constipation    Cough    Depression    Family history of adverse reaction to anesthesia    Pt mother would wake up during procedures   GERD (gastroesophageal reflux disease)    Headache(784.0)    Nausea & vomiting    Obsessive compulsive disorder    not on any medication at this time   Sickle cell trait ([place])    Thalassemia     Past Surgical History:  Procedure Laterality Date   AXILLARY LYMPH NODE BIOPSY Right 07/01/2020   Procedure: EXCISIONAL BIOPSY RIGHT AXILLARY LYMPH NODE;  Surgeon: Coralie Keens, MD;  Location: [facility];  Service: General;  Laterality: Right;   CHOLECYSTECTOMY  02/03/2011   Procedure: LAPAROSCOPIC CHOLECYSTECTOMY;  Surgeon: Harl Bowie, MD;  Location: [facility];  Service: General;  Laterality: N/A;  Laparoscopic Choleystectomy   COLONOSCOPY WITH PROPOFOL N/A 06/01/2016   Procedure: COLONOSCOPY WITH PROPOFOL;  Surgeon: Ronnette Juniper, MD;  Location: [facility];  Service: Gastroenterology;  Laterality: N/A;   MOUTH SURGERY  2000     There were no vitals filed for this visit.   Subjective Assessment - 09/17/20 1109     Subjective Hives are getting better.  Nothing new with walking and balance.  Tried to walk into a restaurant the other day with the cane with my husband and I think it was too far.    Patient is accompained by: Family member   spouse   Pertinent History Had been going thorugh assessment of lymph nodes to r/o cancer (no cancer per pt report); NCV normal    Limitations Standing;Walking;House hold activities    Diagnostic tests MRI mild progression of L recess/foraminal protrusion of L3-4    Patient Stated Goals Pt's goals are to get back to walking independently.    Currently in Pain? Yes    Pain Score 2     Pain Location Back    Pain Orientation Lower    Pain Descriptors / Indicators Aching;Sore    Pain Type Acute pain;Neuropathic pain    Pain Onset More than a month ago    Pain Frequency Intermittent    Aggravating Factors  bending forward    Pain Relieving Factors stretching, avoiding bending movements    Multiple Pain Sites Yes    Pain Score 4    Pain Location Foot    Pain Orientation Right;Left    Pain  Descriptors / Indicators Tightness;Tingling    Pain Type Acute pain    Pain Onset More than a month ago    Pain Frequency Constant    Aggravating Factors  unsure    Pain Relieving Factors warmth                               [facility] Adult PT Treatment/Exercise - 09/17/20 0001       Ambulation/Gait   Ambulation/Gait Yes    Ambulation/Gait Assistance 4: Min guard    Ambulation Distance (Feet) 115 Feet   85; 200   Assistive device Straight cane    Gait Pattern Step-through pattern;Decreased step length - right;Decreased step length - left;Decreased stride length;Decreased hip/knee flexion - right;Decreased hip/knee flexion - left;Decreased dorsiflexion - right;Decreased dorsiflexion - left;Right foot flat;Left foot flat;Wide base of support    Ambulation Surface  Level;Indoor    Gait Comments 30 ft x 4, including turns, to simulate short distance for gait practice with cane at home.  Educated pt/husband to try short distances, 2-3 times per day in home with cane (with husband supervision) and then progress to outdoor distances.                 Balance Exercises - 09/17/20 0001       Balance Exercises: Standing   SLS with Vectors Solid surface;Foam/compliant surface;Upper extremity assist 2;Limitations    SLS with Vectors Limitations step taps to 6" step, x 10 reps, use of visual cues with mirror to help control placement of LLE.  Standing on [employer]ire[employer], tapping to 6" block x 10 reps, then standing on Aire[employer], alt step taps to floor, x 10 reps    Stepping Strategy Anterior;Posterior;Lateral;UE support;10 reps;Limitations    Stepping Strategy Limitations Pt with initial ataxic placmeent of LLE with step strategy; pt improves with cues to feel limits of stability through hips.   Cues to feel weightshifting and limits of stability through hips and movement patterns improve.   Retro Gait 3 reps;Upper extremity support;Limitations    Retro Gait Limitations Forward/back walking    Marching Solid surface;Upper extremity assist 2;Forwards;Limitations    Marching Limitations 3 reps forward marching, then backward walking               PT Education - 09/17/20 1454     Education Details Walking program with cane for home distances-see instructions    Person(s) Educated Patient;Spouse    Methods Explanation;Demonstration;Handout    Comprehension Verbalized understanding;Returned demonstration              PT Short Term Goals - 09/13/20 1510       PT SHORT TERM GOAL #1   Title STGs= LTGs               PT Long Term Goals - 09/13/20 1512       PT LONG TERM GOAL #1   Title Pt will be independent with progression of HEP for improved flexibility,strength, pain, transfers, and gait.  TARGET 10/08/20    Baseline 09/08/20, with pt meeting this  goal; she could benefit from additional standing exercises as therapy progresses    Time 4    Period Weeks    Status Revised      PT LONG TERM GOAL #2   Title Pt will improve gait velocity to at least 2.62 ft/sec with RW or cane for improved gait efficiency and safety.    Baseline 09/08/20: 2.58  ft/sec with RW    Time 4    Period Weeks    Status Revised      PT LONG TERM GOAL #3   Title Pt will ambulate at least 100 ft, supervision, with cane, for imrpoved household mobility.    Baseline 09/10/20: with min HHA  for 115 feet with straight cane    Time 4    Period Weeks    Status Revised      PT LONG TERM GOAL #4   Title Berg score to improve to at least 40/56 for decreased fall risk.    Baseline 09/10/20: 36/56 scored today, improved from 30/56 just not to goal level    Time 4    Period Weeks    Status Revised                   Plan - 09/17/20 1454     Clinical Impression Statement Pt progresses with gait distance today with cane and supervision, up to one bout of 200 ft.  Educated pt and husband on walking program for short distances with cane in the home (prior to using cane for longer distance, outdoor surfaces).  Pt responds well to balance exercises with cues to feel weigthshifting/limits of stability through her hips, since she continues to have difficulty with sensation in her feet.    Personal Factors and Comorbidities Comorbidity 3+    Comorbidities PMH:  chronic lumbar radiculopathy, RA, GERD, obesity, lymphadenopathy, 07/22/20:MRI showed mild progression of L recess/foraminal protrusion at L3-4    Examination-Activity Limitations Locomotion Level;Transfers;Stand    Examination-Participation Restrictions Occupation;Community Activity    Stability/Clinical Decision Making Evolving/Moderate complexity    Rehab Potential Good    PT Frequency 2x / week    PT Duration 4 weeks   per recert [id number]   PT Treatment/Interventions ADLs/Self Care Home Management;Gait  training;Electrical Stimulation;Cryotherapy;DME Instruction;Neuromuscular re-education;Balance training;Therapeutic exercise;Therapeutic activities;Functional mobility training;Moist Heat;Traction;Ultrasound;Patient/family education;Manual techniques;Passive range of motion    PT Next Visit Plan Work on standing balance, balance strategies, progression of gait with cane on level/compliant surfaces; use visual feedback of targets/mirror to help with LLE placement and decreased ataxic movements.    PT Home Exercise Plan MedbridgeAccess Code: [email or web address]    Consulted and Agree with Plan of Care Patient;Family member/caregiver    Family Member Consulted husband             Patient will benefit from skilled therapeutic intervention in order to improve the following deficits and impairments:  Abnormal gait, Decreased balance, Decreased range of motion, Difficulty walking, Pain, Postural dysfunction, Decreased strength, Decreased mobility  Visit Diagnosis: Other abnormalities of gait and mobility     Problem List Patient Active Problem List   Diagnosis Date Noted   Impaired ambulation 07/21/2020   Lumbosacral radiculopathy    Weakness of both lower extremities    IDA (iron deficiency anemia) 05/24/2020   Symptomatic cholelithiasis 01/31/2011   ALLERGIC RHINITIS 07/02/2008   HEADACHE, CHRONIC 07/02/2008   DYSPNEA 07/02/2008   COUGH 07/02/2008   OBESITY 07/01/2008   SICKLE CELL TRAIT 07/01/2008   OBSESSIVE-COMPULSIVE DISORDER 07/01/2008   DEPRESSION 07/01/2008    Beckem Tomberlin W. 09/17/2020, 2:58 PM Frazier Butt., PT  [facility] [address] [place], [place], [address] Phone: [phone]   Fax:  [phone]  Name: Miranda Padilla MRN: [id number] Date of Birth: Nov 27, 1965

## 2020-09-21 ENCOUNTER — Ambulatory Visit: Payer: 59 | Admitting: Physical Therapy

## 2020-09-21 ENCOUNTER — Encounter: Payer: Self-pay | Admitting: Physical Therapy

## 2020-09-21 ENCOUNTER — Other Ambulatory Visit: Payer: Self-pay

## 2020-09-21 DIAGNOSIS — M6281 Muscle weakness (generalized): Secondary | ICD-10-CM

## 2020-09-21 DIAGNOSIS — R2689 Other abnormalities of gait and mobility: Secondary | ICD-10-CM

## 2020-09-21 NOTE — Patient Instructions (Signed)
Try foam roller/rolling pin along the calf muscle on the side of your leg, towards your ankle   -rolling up and down gently to massage these muscles  -you can have your husband help you if needed  -perform 2-3 minutes each side, 1-2 times per day.

## 2020-09-21 NOTE — Therapy (Signed)
[facility] [address] [place], [place], [address] Phone: [phone]   Fax:  [phone]  Physical Therapy Treatment  Patient Details  Name: Miranda Padilla MRN: [id number] Date of Birth: 1965/08/13 Referring Provider (PT): (Dr. Corey Harold   Encounter Date: 09/21/2020   PT End of Session - 09/21/20 1103     Visit Number 14    Number of Visits 19    Date for PT Re-Evaluation 10/11/20    Authorization Type UHC    PT Start Time 1017    PT Stop Time 1100    PT Time Calculation (min) 43 min    Equipment Utilized During Treatment Gait belt    Activity Tolerance Patient tolerated treatment well;No increased pain    Behavior During Therapy WFL for tasks assessed/performed             Past Medical History:  Diagnosis Date   Abdominal distension    Abdominal pain    Anemia    Anxiety    Arthritis    Asthma    Bronchitis    Constipation    Cough    Depression    Family history of adverse reaction to anesthesia    Pt mother would wake up during procedures   GERD (gastroesophageal reflux disease)    Headache(784.0)    Nausea & vomiting    Obsessive compulsive disorder    not on any medication at this time   Sickle cell trait ([place])    Thalassemia     Past Surgical History:  Procedure Laterality Date   AXILLARY LYMPH NODE BIOPSY Right 07/01/2020   Procedure: EXCISIONAL BIOPSY RIGHT AXILLARY LYMPH NODE;  Surgeon: Coralie Keens, MD;  Location: [facility];  Service: General;  Laterality: Right;   CHOLECYSTECTOMY  02/03/2011   Procedure: LAPAROSCOPIC CHOLECYSTECTOMY;  Surgeon: Harl Bowie, MD;  Location: [facility];  Service: General;  Laterality: N/A;  Laparoscopic Choleystectomy   COLONOSCOPY WITH PROPOFOL N/A 06/01/2016   Procedure: COLONOSCOPY WITH PROPOFOL;  Surgeon: Ronnette Juniper, MD;  Location: [facility];  Service: Gastroenterology;  Laterality: N/A;   MOUTH SURGERY  2000     There were no vitals filed for this visit.   Subjective Assessment - 09/21/20 1021     Subjective Stretches help, but still have the spasms/tightening in lateral calf area.  Pretty constant.    Patient is accompained by: Family member   spouse   Pertinent History Had been going thorugh assessment of lymph nodes to r/o cancer (no cancer per pt report); NCV normal    Limitations Standing;Walking;House hold activities    Diagnostic tests MRI mild progression of L recess/foraminal protrusion of L3-4    Patient Stated Goals Pt's goals are to get back to walking independently.    Currently in Pain? Yes    Pain Score 5     Pain Location Foot   and calf   Pain Orientation Right;Left    Pain Descriptors / Indicators Tightness;Spasm   tensing up   Pain Onset More than a month ago    Pain Frequency Constant    Aggravating Factors  constant    Pain Relieving Factors stretching    Pain Onset More than a month ago                               [facility] Adult PT Treatment/Exercise - 09/21/20 0001       Ambulation/Gait  Ambulation/Gait Yes    Ambulation/Gait Assistance 4: Min guard;5: Supervision    Ambulation Distance (Feet) 230 Feet   230   Assistive device Straight cane    Gait Pattern Step-through pattern;Decreased step length - right;Decreased step length - left;Decreased stride length;Decreased hip/knee flexion - right;Decreased hip/knee flexion - left;Decreased dorsiflexion - right;Decreased dorsiflexion - left;Right foot flat;Left foot flat;Wide base of support    Ambulation Surface Level;Indoor    Gait Comments Gait negotiating around furniture with turns to R and L, with supervision/ min guard at times.  No overt LOB.  Gait 30 ft x 6 reps with SPC, supervision.      High Level Balance   High Level Balance Activities Figure 8 turns    High Level Balance Comments Figure-8 turns around cones, 2 reps, 2 sets, with min guard/supervision, mild unsteadiness with turns  towards left side.      Manual Therapy   Manual Therapy Soft tissue mobilization;Other (comment)   Massage   Manual therapy comments Massage using foam roller to R and L anterior tibialis to relax spasms/tightness.  PT Performed 2 minutes each leg with foot propped (cues to relax leg) on stool, then pt performs 1 minute each leg (x 2 ).  No additional c/o spasms and pt reports feeling relief.                    PT Education - 09/21/20 1030     Education Details Use of foam roller or rolling pin along anterior tib muscle for massage (pt/husband)    Person(s) Educated Patient;Spouse    Methods Explanation;Demonstration    Comprehension Verbalized understanding;Returned demonstration              PT Short Term Goals - 09/13/20 1510       PT SHORT TERM GOAL #1   Title STGs= LTGs               PT Long Term Goals - 09/13/20 1512       PT LONG TERM GOAL #1   Title Pt will be independent with progression of HEP for improved flexibility,strength, pain, transfers, and gait.  TARGET 10/08/20    Baseline 09/08/20, with pt meeting this goal; she could benefit from additional standing exercises as therapy progresses    Time 4    Period Weeks    Status Revised      PT LONG TERM GOAL #2   Title Pt will improve gait velocity to at least 2.62 ft/sec with RW or cane for improved gait efficiency and safety.    Baseline 09/08/20: 2.58 ft/sec with RW    Time 4    Period Weeks    Status Revised      PT LONG TERM GOAL #3   Title Pt will ambulate at least 100 ft, supervision, with cane, for imrpoved household mobility.    Baseline 09/10/20: with min HHA  for 115 feet with straight cane    Time 4    Period Weeks    Status Revised      PT LONG TERM GOAL #4   Title Berg score to improve to at least 40/56 for decreased fall risk.    Baseline 09/10/20: 36/56 scored today, improved from 30/56 just not to goal level    Time 4    Period Weeks    Status Revised                    Plan - 09/21/20  30     Clinical Impression Statement Pt continues to increase gait distance, more with supervision, less min guard assistance, using cane.  Pt reports bothersome tightness/spasm-like discomfort at beginning of session; PT utilized and instructed pt in use of foam roller (rolling pin at home) along anterior tibialis muscle for massage and to relax muscles.  Pt reports relief, less discomfort and no additional spasms.  She will continue to benefit from skilled PT to further address strength, balance, gait towards improved independence.    Personal Factors and Comorbidities Comorbidity 3+    Comorbidities PMH:  chronic lumbar radiculopathy, RA, GERD, obesity, lymphadenopathy, 07/22/20:MRI showed mild progression of L recess/foraminal protrusion at L3-4    Examination-Activity Limitations Locomotion Level;Transfers;Stand    Examination-Participation Restrictions Occupation;Community Activity    Stability/Clinical Decision Making Evolving/Moderate complexity    Rehab Potential Good    PT Frequency 2x / week    PT Duration 4 weeks   per recert [id number]   PT Treatment/Interventions ADLs/Self Care Home Management;Gait training;Electrical Stimulation;Cryotherapy;DME Instruction;Neuromuscular re-education;Balance training;Therapeutic exercise;Therapeutic activities;Functional mobility training;Moist Heat;Traction;Ultrasound;Patient/family education;Manual techniques;Passive range of motion    PT Next Visit Plan Work on standing balance, balance strategies, progression of gait with cane on level/compliant surfaces; how did use of foam roller go at home to help relax lower leg musculature?  May need to check/revise goal for gait with cane.    PT Home Exercise Plan [email or web address] Code: [email or web address]    Consulted and Agree with Plan of Care Patient;Family member/caregiver    Family Member Consulted husband             Patient will benefit from skilled therapeutic intervention in  order to improve the following deficits and impairments:  Abnormal gait, Decreased balance, Decreased range of motion, Difficulty walking, Pain, Postural dysfunction, Decreased strength, Decreased mobility  Visit Diagnosis: Other abnormalities of gait and mobility  Muscle weakness (generalized)     Problem List Patient Active Problem List   Diagnosis Date Noted   Impaired ambulation 07/21/2020   Lumbosacral radiculopathy    Weakness of both lower extremities    IDA (iron deficiency anemia) 05/24/2020   Symptomatic cholelithiasis 01/31/2011   ALLERGIC RHINITIS 07/02/2008   HEADACHE, CHRONIC 07/02/2008   DYSPNEA 07/02/2008   COUGH 07/02/2008   OBESITY 07/01/2008   SICKLE CELL TRAIT 07/01/2008   OBSESSIVE-COMPULSIVE DISORDER 07/01/2008   DEPRESSION 07/01/2008    Vernal Hritz W. 09/21/2020, 11:10 AM Frazier Butt., PT   [facility] [address] [place], [place], [address] Phone: [phone]   Fax:  [phone]  Name: Miranda Padilla MRN: [id number] Date of Birth: [birth year]

## 2020-09-24 ENCOUNTER — Encounter: Payer: Self-pay | Admitting: Physical Therapy

## 2020-09-24 ENCOUNTER — Ambulatory Visit: Payer: 59 | Attending: Internal Medicine | Admitting: Physical Therapy

## 2020-09-24 ENCOUNTER — Other Ambulatory Visit: Payer: Self-pay

## 2020-09-24 DIAGNOSIS — R2681 Unsteadiness on feet: Secondary | ICD-10-CM | POA: Diagnosis present

## 2020-09-24 DIAGNOSIS — R2689 Other abnormalities of gait and mobility: Secondary | ICD-10-CM | POA: Diagnosis present

## 2020-09-24 DIAGNOSIS — M5441 Lumbago with sciatica, right side: Secondary | ICD-10-CM | POA: Insufficient documentation

## 2020-09-24 DIAGNOSIS — G8929 Other chronic pain: Secondary | ICD-10-CM | POA: Diagnosis present

## 2020-09-24 DIAGNOSIS — M6281 Muscle weakness (generalized): Secondary | ICD-10-CM | POA: Insufficient documentation

## 2020-09-24 DIAGNOSIS — R293 Abnormal posture: Secondary | ICD-10-CM | POA: Diagnosis present

## 2020-09-24 DIAGNOSIS — M5442 Lumbago with sciatica, left side: Secondary | ICD-10-CM | POA: Diagnosis present

## 2020-09-24 NOTE — Therapy (Signed)
[facility] [address] [place], [place], [address] Phone: [phone]   Fax:  [phone]  Physical Therapy Treatment  Patient Details  Name: Miranda Padilla MRN: [id number] Date of Birth: [birth year] Referring Provider (PT): (Dr. Corey Harold   Encounter Date: 09/24/2020   PT End of Session - 09/24/20 1057     Visit Number 15    Number of Visits 19    Date for PT Re-Evaluation 10/11/20    Authorization Type UHC    PT Start Time 5456    PT Stop Time 1057    PT Time Calculation (min) 39 min    Equipment Utilized During Treatment Gait belt    Activity Tolerance Patient tolerated treatment well;No increased pain    Behavior During Therapy WFL for tasks assessed/performed             Past Medical History:  Diagnosis Date   Abdominal distension    Abdominal pain    Anemia    Anxiety    Arthritis    Asthma    Bronchitis    Constipation    Cough    Depression    Family history of adverse reaction to anesthesia    Pt mother would wake up during procedures   GERD (gastroesophageal reflux disease)    Headache(784.0)    Nausea & vomiting    Obsessive compulsive disorder    not on any medication at this time   Sickle cell trait ([place])    Thalassemia     Past Surgical History:  Procedure Laterality Date   AXILLARY LYMPH NODE BIOPSY Right 07/01/2020   Procedure: EXCISIONAL BIOPSY RIGHT AXILLARY LYMPH NODE;  Surgeon: Coralie Keens, MD;  Location: [facility];  Service: General;  Laterality: Right;   CHOLECYSTECTOMY  02/03/2011   Procedure: LAPAROSCOPIC CHOLECYSTECTOMY;  Surgeon: Harl Bowie, MD;  Location: [facility];  Service: General;  Laterality: N/A;  Laparoscopic Choleystectomy   COLONOSCOPY WITH PROPOFOL N/A 06/01/2016   Procedure: COLONOSCOPY WITH PROPOFOL;  Surgeon: Ronnette Juniper, MD;  Location: [facility];  Service: Gastroenterology;  Laterality: N/A;   MOUTH SURGERY  2000     There were no vitals filed for this visit.   Subjective Assessment - 09/24/20 1019     Subjective Using the roller has really helped lessen/alleviate the tightness and spasms.    No other changes.    Patient is accompained by: Family member   spouse   Pertinent History Had been going thorugh assessment of lymph nodes to r/o cancer (no cancer per pt report); NCV normal    Limitations Standing;Walking;House hold activities    Diagnostic tests MRI mild progression of L recess/foraminal protrusion of L3-4    Patient Stated Goals Pt's goals are to get back to walking independently.    Currently in Pain? Yes    Pain Score 4     Pain Location Foot    Pain Orientation Right;Left    Pain Descriptors / Indicators Tightness;Spasm    Pain Type Acute pain;Neuropathic pain    Pain Onset More than a month ago    Pain Frequency Constant    Aggravating Factors  constant    Pain Relieving Factors stretching helps    Pain Onset More than a month ago                               [facility] Adult PT Treatment/Exercise - 09/24/20 0001  Ambulation/Gait   Ambulation/Gait Yes    Ambulation/Gait Assistance 5: Supervision    Ambulation/Gait Assistance Details Slowed pace with gait with environmental scanning    Ambulation Distance (Feet) 230 Feet   115 ft, 200 ft with environmental scanning   Assistive device Straight cane    Gait Pattern Step-through pattern;Decreased step length - right;Decreased step length - left;Decreased stride length;Decreased hip/knee flexion - right;Decreased hip/knee flexion - left;Decreased dorsiflexion - right;Decreased dorsiflexion - left;Right foot flat;Left foot flat;Wide base of support    Ambulation Surface Level;Indoor    Gait velocity 11.75 sec =2.79 ft/sec with cane    Gait Comments Gait with environmental scanning.  Discussed pt's progress with cane and requested pt walk into therapy with cane (not RW) next visit.      Exercises   Other  Exercises  Seated foot rolling forward/back on pool noodle, 10 reps each side.  PT uses foam roller to massage ant tib BLEs 1 min each.  Pt reports she is doing this 2x/day with rolling pin at home with less tightness, but it is bothering her back.  Discussed optimal positioning to lessen strain on her back.                 Balance Exercises - 09/24/20 0001       Balance Exercises: Standing   Standing Eyes Opened Wide (BOA);Solid surface;Limitations    Standing Eyes Opened Limitations Head turns x 5, head nods x 5    Standing Eyes Closed Wide (BOA);Head turns;Other reps (comment);Limitations;Solid surface    Standing Eyes Closed Limitations Head steady EC 10 sec, then EC head turns x 5, head nods x 5.  Standing EC feet apart with alt UE lifts x 5 reps    Stepping Strategy Anterior;Posterior;Lateral;UE support;10 reps;Limitations    Stepping Strategy Limitations Pt with initial ataxic placmeent of LLE with step strategy; pt improves with cues to feel limits of stability through hips.    Sidestepping Upper extremity support;3 reps;Limitations    Sidestepping Limitations Initial ataxic placement with LLE    Other Standing Exercises Forward<>back step and weigthshift x 10 reps each side; cues for hip/knee flexion to clear foot in posterior direction               PT Education - 09/24/20 1156     Education Details Try walking into therapy with cane (not RW) next visit    Person(s) Educated Patient;Spouse    Methods Explanation    Comprehension Verbalized understanding              PT Short Term Goals - 09/13/20 1510       PT SHORT TERM GOAL #1   Title STGs= LTGs               PT Long Term Goals - 09/24/20 1157       PT LONG TERM GOAL #1   Title Pt will be independent with progression of HEP for improved flexibility,strength, pain, transfers, and gait.  TARGET 10/08/20    Baseline 09/08/20, with pt meeting this goal; she could benefit from additional standing  exercises as therapy progresses    Time 4    Period Weeks    Status Revised      PT LONG TERM GOAL #2   Title Pt will improve gait velocity to at least 2.62 ft/sec with RW or cane for improved gait efficiency and safety.    Baseline 09/08/20: 2.58 ft/sec with RW    Time 4  Period Weeks    Status Revised      PT LONG TERM GOAL #3   Title Pt will ambulate at least 100 ft, supervision, with cane, for imrpoved household mobility.  GOAL MET; revised:  Pt to ambulate 350 ft with cane, mod I for improved gait efficiency, safety, and independence.    Baseline 09/10/20: with min HHA  for 115 feet with straight cane; 230 ft straight cane supervision, 09/24/2020    Time 4    Period Weeks    Status Revised      PT LONG TERM GOAL #4   Title Berg score to improve to at least 40/56 for decreased fall risk.    Baseline 09/10/20: 36/56 scored today, improved from 30/56 just not to goal level    Time 4    Period Weeks    Status Revised                   Plan - 09/24/20 1158     Clinical Impression Statement PT continues to improve gait with cane today, meeting initial LTG for 100 ft with cane and supervision.  LTG 3 revised.  With gait using cane and environmental scanning tasks, pt slows pace, and feels more unsteady, but no overt LOB noted.  Worked on static standing balance today with vision removed and with head motions, pt more unsteady and prefers widening BOS and increasing UE support.  Cues for foot placement and posture throughout the activity.    Personal Factors and Comorbidities Comorbidity 3+    Comorbidities PMH:  chronic lumbar radiculopathy, RA, GERD, obesity, lymphadenopathy, 07/22/20:MRI showed mild progression of L recess/foraminal protrusion at L3-4    Examination-Activity Limitations Locomotion Level;Transfers;Stand    Examination-Participation Restrictions Occupation;Community Activity    Stability/Clinical Decision Making Evolving/Moderate complexity    Rehab Potential  Good    PT Frequency 2x / week    PT Duration 4 weeks   per recert 3/61/44   PT Treatment/Interventions ADLs/Self Care Home Management;Gait training;Electrical Stimulation;Cryotherapy;DME Instruction;Neuromuscular re-education;Balance training;Therapeutic exercise;Therapeutic activities;Functional mobility training;Moist Heat;Traction;Ultrasound;Patient/family education;Manual techniques;Passive range of motion    PT Next Visit Plan Cotn[place]ue to work on standing balance, balance strategies, progression of gait with cane on level/compliant surfaces; try outdoor surfaces/short distance with cane    PT Home Exercise Plan MedbridgeAccess Code: [email or web address]    Consulted and Agree with Plan of Care Patient;Family member/caregiver    Family Member Consulted husband             Patient will benefit from skilled therapeutic intervention in order to improve the following deficits and impairments:  Abnormal gait, Decreased balance, Decreased range of motion, Difficulty walking, Pain, Postural dysfunction, Decreased strength, Decreased mobility  Visit Diagnosis: Other abnormalities of gait and mobility  Muscle weakness (generalized)     Problem List Patient Active Problem List   Diagnosis Date Noted   Impaired ambulation 07/21/2020   Lumbosacral radiculopathy    Weakness of both lower extremities    IDA (iron deficiency anemia) 05/24/2020   Symptomatic cholelithiasis 01/31/2011   ALLERGIC RHINITIS 07/02/2008   HEADACHE, CHRONIC 07/02/2008   DYSPNEA 07/02/2008   COUGH 07/02/2008   OBESITY 07/01/2008   SICKLE CELL TRAIT 07/01/2008   OBSESSIVE-COMPULSIVE DISORDER 07/01/2008   DEPRESSION 07/01/2008    Chau Savell W. 09/24/2020, 12:02 PM Frazier Butt., PT  [facility] [address] [place], [place], [address] Phone: [phone]   Fax:  [phone]  Name: Miranda Padilla MRN: [id number] Date of Birth: 09/08/1965

## 2020-09-29 ENCOUNTER — Other Ambulatory Visit: Payer: Self-pay

## 2020-09-29 ENCOUNTER — Ambulatory Visit: Payer: 59

## 2020-09-29 DIAGNOSIS — R293 Abnormal posture: Secondary | ICD-10-CM

## 2020-09-29 DIAGNOSIS — R2689 Other abnormalities of gait and mobility: Secondary | ICD-10-CM

## 2020-09-29 DIAGNOSIS — G8929 Other chronic pain: Secondary | ICD-10-CM

## 2020-09-29 DIAGNOSIS — M6281 Muscle weakness (generalized): Secondary | ICD-10-CM

## 2020-09-29 NOTE — Therapy (Addendum)
[facility] [address] [place], [place], [address] Phone: [phone]   Fax:  [phone]  Physical Therapy Treatment  Patient Details  Name: Miranda Padilla MRN: [id number] Date of Birth: 04/22/1965 Referring Provider (PT): (Dr. Corey Harold   Encounter Date: 09/29/2020   PT End of Session - 09/29/20 1024     Visit Number 16    Number of Visits 19    Date for PT Re-Evaluation 10/11/20    Authorization Type UHC    PT Start Time 1779    PT Stop Time 1100    PT Time Calculation (min) 45 min    Equipment Utilized During Treatment Gait belt    Activity Tolerance Patient tolerated treatment well;No increased pain    Behavior During Therapy WFL for tasks assessed/performed             Past Medical History:  Diagnosis Date   Abdominal distension    Abdominal pain    Anemia    Anxiety    Arthritis    Asthma    Bronchitis    Constipation    Cough    Depression    Family history of adverse reaction to anesthesia    Pt mother would wake up during procedures   GERD (gastroesophageal reflux disease)    Headache(784.0)    Nausea & vomiting    Obsessive compulsive disorder    not on any medication at this time   Sickle cell trait ([place])    Thalassemia     Past Surgical History:  Procedure Laterality Date   AXILLARY LYMPH NODE BIOPSY Right 07/01/2020   Procedure: EXCISIONAL BIOPSY RIGHT AXILLARY LYMPH NODE;  Surgeon: Coralie Keens, MD;  Location: [facility];  Service: General;  Laterality: Right;   CHOLECYSTECTOMY  02/03/2011   Procedure: LAPAROSCOPIC CHOLECYSTECTOMY;  Surgeon: Harl Bowie, MD;  Location: [facility];  Service: General;  Laterality: N/A;  Laparoscopic Choleystectomy   COLONOSCOPY WITH PROPOFOL N/A 06/01/2016   Procedure: COLONOSCOPY WITH PROPOFOL;  Surgeon: Ronnette Juniper, MD;  Location: [facility];  Service: Gastroenterology;  Laterality: N/A;   MOUTH SURGERY  2000     There were no vitals filed for this visit.   Subjective Assessment - 09/29/20 1101     Subjective I wanted to come in with st. cane today so I tried. I felt okay but I am not comfortable using it outside yet as I don't have anythign to hold onto but cane. INside home, I am little more comfortable.    Patient is accompained by: Family member   spouse   Pertinent History Had been going thorugh assessment of lymph nodes to r/o cancer (no cancer per pt report); NCV normal    Limitations Standing;Walking;House hold activities    Diagnostic tests MRI mild progression of L recess/foraminal protrusion of L3-4    Patient Stated Goals Pt's goals are to get back to walking independently.    Currently in Pain? Yes    Pain Score 4     Pain Location Back    Pain Onset More than a month ago    Pain Onset More than a month ago              Heel raise and toe raises without HHA: 3 x 10- added to HEP Walking fwd and bwd: with st. Cane: 4 x 20 feet Side steps at countertop without HHA: 5 x 10 feet R and L Functional reaching while standing on floor with wide  BOS: - touching toes: 5x - reaching OH bil with eyes tracking hands: 5x - Trunk twists with contralateral reaching: 5x R and L                       Upper Extremity Functional Index Score :   /80     PT Short Term Goals - 09/13/20 1510       PT SHORT TERM GOAL #1   Title STGs= LTGs               PT Long Term Goals - 09/24/20 1157       PT LONG TERM GOAL #1   Title Pt will be independent with progression of HEP for improved flexibility,strength, pain, transfers, and gait.  TARGET 10/08/20    Baseline 09/08/20, with pt meeting this goal; she could benefit from additional standing exercises as therapy progresses    Time 4    Period Weeks    Status Revised      PT LONG TERM GOAL #2   Title Pt will improve gait velocity to at least 2.62 ft/sec with RW or cane for improved gait efficiency and safety.     Baseline 09/08/20: 2.58 ft/sec with RW    Time 4    Period Weeks    Status Revised      PT LONG TERM GOAL #3   Title Pt will ambulate at least 100 ft, supervision, with cane, for imrpoved household mobility.  GOAL MET; revised:  Pt to ambulate 350 ft with cane, mod I for improved gait efficiency, safety, and independence.    Baseline 09/10/20: with min HHA  for 115 feet with straight cane; 230 ft straight cane supervision, 09/24/2020    Time 4    Period Weeks    Status Revised      PT LONG TERM GOAL #4   Title Berg score to improve to at least 40/56 for decreased fall risk.    Baseline 09/10/20: 36/56 scored today, improved from 30/56 just not to goal level    Time 4    Period Weeks    Status Revised                   Plan - 09/29/20 1100     Clinical Impression Statement Pt tolerated session well. Pt fatigued quickly with fwd/bwd walking with cane and noticed significant change in her balance. With functional reaching, pt struggled most with reaching OH and trunk twists with contralateral reaching. Pt will benefit from continued skilled PT to work on static and dynamic balance and progress gait with LRAD.    Personal Factors and Comorbidities Comorbidity 3+    Comorbidities PMH:  chronic lumbar radiculopathy, RA, GERD, obesity, lymphadenopathy, 07/22/20:MRI showed mild progression of L recess/foraminal protrusion at L3-4    Examination-Activity Limitations Locomotion Level;Transfers;Stand    Examination-Participation Restrictions Occupation;Community Activity    Stability/Clinical Decision Making Evolving/Moderate complexity    Rehab Potential Good    PT Frequency 2x / week    PT Duration 4 weeks   per recert 9/38/10   PT Treatment/Interventions ADLs/Self Care Home Management;Gait training;Electrical Stimulation;Cryotherapy;DME Instruction;Neuromuscular re-education;Balance training;Therapeutic exercise;Therapeutic activities;Functional mobility training;Moist  Heat;Traction;Ultrasound;Patient/family education;Manual techniques;Passive range of motion    PT Next Visit Plan Cotninue to work on standing balance, balance strategies, progression of gait with cane on level/compliant surfaces; try outdoor surfaces/short distance with cane    PT Home Exercise Plan MedbridgeAccess Code: [email or web address]    Consulted and Agree with  Plan of Care Patient;Family member/caregiver    Family Member Consulted husband             Patient will benefit from skilled therapeutic intervention in order to improve the following deficits and impairments:  Abnormal gait, Decreased balance, Decreased range of motion, Difficulty walking, Pain, Postural dysfunction, Decreased strength, Decreased mobility  Visit Diagnosis: Other abnormalities of gait and mobility  Muscle weakness (generalized)  Abnormal posture  Chronic bilateral low back pain with bilateral sciatica     Problem List Patient Active Problem List   Diagnosis Date Noted   Impaired ambulation 07/21/2020   Lumbosacral radiculopathy    Weakness of both lower extremities    IDA (iron deficiency anemia) 05/24/2020   Symptomatic cholelithiasis 01/31/2011   ALLERGIC RHINITIS 07/02/2008   HEADACHE, CHRONIC 07/02/2008   DYSPNEA 07/02/2008   COUGH 07/02/2008   OBESITY 07/01/2008   SICKLE CELL TRAIT 07/01/2008   OBSESSIVE-COMPULSIVE DISORDER 07/01/2008   DEPRESSION 07/01/2008    Kerrie Pleasure, PT 09/29/2020, 11:02 AM  [facility] [address] [place], [place], [address] Phone: [phone]   Fax:  [phone]  Name: Miranda Padilla MRN: [id number] Date of Birth: 1965/07/03

## 2020-10-01 ENCOUNTER — Ambulatory Visit: Payer: 59 | Admitting: Physical Therapy

## 2020-10-01 ENCOUNTER — Other Ambulatory Visit: Payer: Self-pay

## 2020-10-01 ENCOUNTER — Encounter: Payer: Self-pay | Admitting: Physical Therapy

## 2020-10-01 DIAGNOSIS — R2689 Other abnormalities of gait and mobility: Secondary | ICD-10-CM

## 2020-10-01 DIAGNOSIS — M6281 Muscle weakness (generalized): Secondary | ICD-10-CM

## 2020-10-01 DIAGNOSIS — R293 Abnormal posture: Secondary | ICD-10-CM

## 2020-10-03 NOTE — Therapy (Signed)
[facility] [address] [place], [place], [address] Phone: [phone]   Fax:  [phone]  Physical Therapy Treatment  Patient Details  Name: Miranda Padilla MRN: [id number] Date of Birth: November 23, 1965 Referring Provider (PT): (Dr. Corey Harold   Encounter Date: 10/01/2020    10/01/20 1025  PT Visits / Re-Eval  Visit Number 17  Number of Visits 19  Date for PT Re-Evaluation 10/11/20  Authorization  Authorization Type UHC  PT Time Calculation  PT Start Time 1017  PT Stop Time 1056  PT Time Calculation (min) 39 min  PT - End of Session  Equipment Utilized During Treatment Gait belt  Activity Tolerance Patient tolerated treatment well;No increased pain  Behavior During Therapy WFL for tasks assessed/performed    Past Medical History:  Diagnosis Date   Abdominal distension    Abdominal pain    Anemia    Anxiety    Arthritis    Asthma    Bronchitis    Constipation    Cough    Depression    Family history of adverse reaction to anesthesia    Pt mother would wake up during procedures   GERD (gastroesophageal reflux disease)    Headache(784.0)    Nausea & vomiting    Obsessive compulsive disorder    not on any medication at this time   Sickle cell trait ([place])    Thalassemia     Past Surgical History:  Procedure Laterality Date   AXILLARY LYMPH NODE BIOPSY Right 07/01/2020   Procedure: EXCISIONAL BIOPSY RIGHT AXILLARY LYMPH NODE;  Surgeon: Coralie Keens, MD;  Location: [facility];  Service: General;  Laterality: Right;   CHOLECYSTECTOMY  02/03/2011   Procedure: LAPAROSCOPIC CHOLECYSTECTOMY;  Surgeon: Harl Bowie, MD;  Location: [facility];  Service: General;  Laterality: N/A;  Laparoscopic Choleystectomy   COLONOSCOPY WITH PROPOFOL N/A 06/01/2016   Procedure: COLONOSCOPY WITH PROPOFOL;  Surgeon: Ronnette Juniper, MD;  Location: [facility];  Service: Gastroenterology;  Laterality: N/A;    MOUTH SURGERY  2000    There were no vitals filed for this visit.     10/01/20 1020  Symptoms/Limitations  Subjective No new complaints. No falls. Using cane more, to clinic with session. 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  Patient is accompained by: Family member (spouse)  Pertinent History Had been going thorugh assessment of lymph nodes to r/o cancer (no cancer per pt report); NCV normal  Limitations Standing;Walking;House hold activities  Diagnostic tests MRI mild progression of L recess/foraminal protrusion of L3-4  Patient Stated Goals Pt's goals are to get back to walking independently.  Pain Assessment  Currently in Pain? Yes  Pain Location Back  Pain Orientation Right;Left;Lower  Pain Descriptors / Indicators Tightness;Spasm  Pain Type Acute pain;Neuropathic pain  Pain Onset More than a month ago  Pain Frequency Constant  Aggravating Factors  constant, increased activity  Pain Relieving Factors stretching helps      10/01/20 1026  Transfers  Transfers Sit to Stand;Stand to Sit  Sit to Stand 5: Supervision;With upper extremity assist;From bed;From chair/3-in-1  Stand to Sit 5: Supervision;With upper extremity assist;To bed;To chair/3-in-1  Ambulation/Gait  Ambulation/Gait Yes  Ambulation/Gait Assistance 5: Supervision  Ambulation/Gait Assistance Details cues needed for posture and increased step/stride length  cues for heel>toe step progression at times as well. increased assist for unlevel  outdoor surfaces (min guard assist to min HHA) due to pt anxiety, no balance issues noted  Ambulation Distance (Feet) 300 Feet (x1 in/outdoors, plus around clinic with session)  Assistive device Straight cane  Gait Pattern Step-through pattern;Decreased step length - right;Decreased step length - left;Decreased stride length;Decreased hip/knee flexion -  right;Decreased hip/knee flexion - left;Decreased dorsiflexion - right;Decreased dorsiflexion - left;Right foot flat;Left foot flat;Wide base of support  Ambulation Surface Level;Indoor;Unlevel;Outdoor;Paved  High Level Balance  High Level Balance Activities Negotitating around obstacles;Negotiating over obstacles  High Level Balance Comments 4 cones spaced out in a row<> 2 small bolsters- weaving around cones<>stepping over bolsters with cane, min guard to min assist. cues on step length weaving and sequencing with stepping over bolsters. performed 2 laps down<>back.  Lumbar Exercises: Aerobic  Recumbent Bike Scifit UE/LE's level 3.0 for 6 minutes with goal >/= 40 steps per minute for strengthening, reciprocal movements and stretching with full knee extension emphasized.         10/01/20 1046  Balance Exercises: Standing  Standing Eyes Opened Wide (BOA);Head turns;Foam/compliant surface;5 reps;Limitations  Standing Eyes Opened Limitations on airex no UE support: EO for head movments looking left<>right, then up<>down for 10 reps each. min guard to min assist for balance.  Standing Eyes Closed Foam/compliant surface;Wide (BOA);3 reps;30 secs;Limitations  Standing Eyes Closed Limitations on airex with no UE support for EC 30 sec's x 3 reps with feet hip width apart, min guard to min assist for balance.        PT Short Term Goals - 09/13/20 1510       PT SHORT TERM GOAL #1   Title STGs= LTGs               PT Long Term Goals - 09/24/20 1157       PT LONG TERM GOAL #1   Title Pt will be independent with progression of HEP for improved flexibility,strength, pain, transfers, and gait.  TARGET 10/08/20    Baseline 09/08/20, with pt meeting this goal; she could benefit from additional standing exercises as therapy progresses    Time 4    Period Weeks    Status Revised      PT LONG TERM GOAL #2   Title Pt will improve gait velocity to at least 2.62 ft/sec with RW or cane for  improved gait efficiency and safety.    Baseline 09/08/20: 2.58 ft/sec with RW    Time 4    Period Weeks    Status Revised      PT LONG TERM GOAL #3   Title Pt will ambulate at least 100 ft, supervision, with cane, for imrpoved household mobility.  GOAL MET; revised:  Pt to ambulate 350 ft with cane, mod I for improved gait efficiency, safety, and independence.    Baseline 09/10/20: with min HHA  for 115 feet with straight cane; 230 ft straight cane supervision, 09/24/2020    Time 4    Period Weeks    Status Revised      PT LONG TERM GOAL #4   Title Berg score to improve to at least 40/56 for decreased fall risk.    Baseline 09/10/20: 36/56 scored today, improved from 30/56 just not to goal level    Time 4    Period Weeks    Status Revised               10/01/20 1025  Plan  Clinical Impression Statement Today's skilled session continued to focus on gait  on various surfaces, strengthening and balance training with no issus noted or reported by patient in session. The pt is progressing toward goals and should benefit from continued PT to progress toward unmet goals.  Personal Factors and Comorbidities Comorbidity 3+  Comorbidities PMH:  chronic lumbar radiculopathy, RA, GERD, obesity, lymphadenopathy, 07/22/20:MRI showed mild progression of L recess/foraminal protrusion at L3-4  Examination-Activity Limitations Locomotion Level;Transfers;Stand  Examination-Participation Restrictions Occupation;Community Activity  Pt will benefit from skilled therapeutic intervention in order to improve on the following deficits Abnormal gait;Decreased balance;Decreased range of motion;Difficulty walking;Pain;Postural dysfunction;Decreased strength;Decreased mobility  Stability/Clinical Decision Making Evolving/Moderate complexity  Rehab Potential Good  PT Frequency 2x / week  PT Duration 4 weeks (per recert 9/52/84)  PT Treatment/Interventions ADLs/Self Care Home Management;Gait training;Electrical  Stimulation;Cryotherapy;DME Instruction;Neuromuscular re-education;Balance training;Therapeutic exercise;Therapeutic activities;Functional mobility training;Moist Heat;Traction;Ultrasound;Patient/family education;Manual techniques;Passive range of motion  PT Next Visit Plan Cotninue to work on standing balance, balance strategies, progression of gait with cane on level/compliant surfaces; try outdoor surfaces/short distance with cane  PT Home Exercise Plan MedbridgeAccess Code: [email or web address]  Consulted and Agree with Plan of Care Patient;Family member/caregiver  Family Member Consulted husband         Patient will benefit from skilled therapeutic intervention in order to improve the following deficits and impairments:  Abnormal gait, Decreased balance, Decreased range of motion, Difficulty walking, Pain, Postural dysfunction, Decreased strength, Decreased mobility  Visit Diagnosis: Other abnormalities of gait and mobility  Muscle weakness (generalized)  Abnormal posture     Problem List Patient Active Problem List   Diagnosis Date Noted   Impaired ambulation 07/21/2020   Lumbosacral radiculopathy    Weakness of both lower extremities    IDA (iron deficiency anemia) 05/24/2020   Symptomatic cholelithiasis 01/31/2011   ALLERGIC RHINITIS 07/02/2008   HEADACHE, CHRONIC 07/02/2008   DYSPNEA 07/02/2008   COUGH 07/02/2008   OBESITY 07/01/2008   SICKLE CELL TRAIT 07/01/2008   OBSESSIVE-COMPULSIVE DISORDER 07/01/2008   DEPRESSION 07/01/2008    Willow Ora, PTA, [facility] [address], [place], [place] [address] [phone] 10/03/20, 10:40 PM   Name: Georgene Kopper MRN: [id number] Date of Birth: Jul 20, 1965

## 2020-10-05 ENCOUNTER — Other Ambulatory Visit: Payer: Self-pay

## 2020-10-05 ENCOUNTER — Encounter: Payer: Self-pay | Admitting: Physical Therapy

## 2020-10-05 ENCOUNTER — Ambulatory Visit: Payer: 59 | Admitting: Physical Therapy

## 2020-10-05 DIAGNOSIS — M6281 Muscle weakness (generalized): Secondary | ICD-10-CM

## 2020-10-05 DIAGNOSIS — R2689 Other abnormalities of gait and mobility: Secondary | ICD-10-CM | POA: Diagnosis not present

## 2020-10-05 DIAGNOSIS — R293 Abnormal posture: Secondary | ICD-10-CM

## 2020-10-05 DIAGNOSIS — R2681 Unsteadiness on feet: Secondary | ICD-10-CM

## 2020-10-05 DIAGNOSIS — G8929 Other chronic pain: Secondary | ICD-10-CM

## 2020-10-06 NOTE — Therapy (Signed)
[facility] [address] [place], [place], [address] Phone: [phone]   Fax:  [phone]  Physical Therapy Treatment/Recert  Patient Details  Name: Miranda Padilla MRN: [id number] Date of Birth: 08-22-1965 Referring Provider (PT): (Dr. Corey Harold   Encounter Date: 10/05/2020   PT End of Session - 10/06/20 1236     Visit Number 18    Number of Visits 22    Date for PT Re-Evaluation 11/05/20    Authorization Type UHC    PT Start Time 1020    PT Stop Time 1101    PT Time Calculation (min) 41 min    Equipment Utilized During Treatment --    Activity Tolerance Patient tolerated treatment well;No increased pain    Behavior During Therapy WFL for tasks assessed/performed             Past Medical History:  Diagnosis Date   Abdominal distension    Abdominal pain    Anemia    Anxiety    Arthritis    Asthma    Bronchitis    Constipation    Cough    Depression    Family history of adverse reaction to anesthesia    Pt mother would wake up during procedures   GERD (gastroesophageal reflux disease)    Headache(784.0)    Nausea & vomiting    Obsessive compulsive disorder    not on any medication at this time   Sickle cell trait ([place])    Thalassemia     Past Surgical History:  Procedure Laterality Date   AXILLARY LYMPH NODE BIOPSY Right 07/01/2020   Procedure: EXCISIONAL BIOPSY RIGHT AXILLARY LYMPH NODE;  Surgeon: Coralie Keens, MD;  Location: [facility];  Service: General;  Laterality: Right;   CHOLECYSTECTOMY  02/03/2011   Procedure: LAPAROSCOPIC CHOLECYSTECTOMY;  Surgeon: Harl Bowie, MD;  Location: [facility];  Service: General;  Laterality: N/A;  Laparoscopic Choleystectomy   COLONOSCOPY WITH PROPOFOL N/A 06/01/2016   Procedure: COLONOSCOPY WITH PROPOFOL;  Surgeon: Ronnette Juniper, MD;  Location: [facility];  Service: Gastroenterology;  Laterality: N/A;   MOUTH SURGERY  2000     There were no vitals filed for this visit.   Subjective Assessment - 10/05/20 1023     Subjective Went back and tried work yesterday.  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  Will be trying to do half days for now.    Patient is accompained by: Family member   spouse   Pertinent History Had been going thorugh assessment of lymph nodes to r/o cancer (no cancer per pt report); NCV normal    Limitations Standing;Walking;House hold activities    Diagnostic tests MRI mild progression of L recess/foraminal protrusion of L3-4    Patient Stated Goals Pt's goals are to get back to walking independently.    Currently in Pain? Yes    Pain Score 3     Pain Location Back    Pain Orientation Right;Left;Lower    Pain Descriptors / Indicators Spasm;Tightness    Pain Onset More than a month ago    Pain Frequency Intermittent    Aggravating Factors  increased activity    Pain Relieving Factors stretching    Pain Score 4    Pain Location Foot    Pain Orientation Right;Left    Pain Descriptors / Indicators Tightness;Tingling    Pain Type Surgical pain    Pain Onset More than a month ago  Pain Frequency Constant    Aggravating Factors  unsure    Pain Relieving Factors warmth                               [facility] Adult PT Treatment/Exercise - 10/05/20 1020       Ambulation/Gait   Ambulation/Gait Yes    Ambulation/Gait Assistance 5: Supervision    Ambulation Distance (Feet) 230 Feet   then 400   Assistive device Straight cane    Gait Pattern Step-through pattern;Decreased step length - right;Decreased step length - left;Decreased stride length;Decreased hip/knee flexion - right;Decreased hip/knee flexion - left;Decreased dorsiflexion - right;Decreased dorsiflexion - left;Right foot flat;Left foot flat;Wide base of support    Ambulation Surface Level;Indoor    Gait velocity 2.88 ft/sec    Stairs Yes    Stairs Assistance 4: Min  guard;5: Supervision    Stairs Assistance Details (indicate cue type and reason) Pt negotiates steps with step-to pattern; when descending steps, she goes down with stronger leg leading, and she takes extra time and reports needing extra assist of husband on steps at home.  Educated pt in the weaker leg leading down first for better stability (less work on the weaker leg) and pt is able to perform steps with supervision, improved ease and safety.    Stair Management Technique Two rails;Step to pattern;Forwards   Cues for foot sequencing   Number of Stairs 4   x 4 reps   Height of Stairs 6      Standardized Balance Assessment   Standardized Balance Assessment Dynamic Gait Index      Berg Balance Test   Sit to Stand Able to stand without using hands and stabilize independently    Standing Unsupported Able to stand safely 2 minutes    Sitting with Back Unsupported but Feet Supported on Floor or Stool Able to sit safely and securely 2 minutes    Stand to Sit Sits safely with minimal use of hands    Transfers Able to transfer safely, definite need of hands    Standing Unsupported with Eyes Closed Able to stand 10 seconds with supervision    Standing Ubsupported with Feet Together Able to place feet together independently and stand for 1 minute with supervision    From Standing, Reach Forward with Outstretched Arm Can reach confidently >25 cm (10")    From Standing Position, Pick up Object from Floor Able to pick up shoe, needs supervision    From Standing Position, Turn to Look Behind Over each Shoulder Looks behind from both sides and weight shifts well    Turn 360 Degrees Able to turn 360 degrees safely but slowly    Standing Unsupported, Alternately Place Feet on Step/Stool Able to stand independently and complete 8 steps >20 seconds    Standing Unsupported, One Foot in Front Able to plae foot ahead of the other independently and hold 30 seconds    Standing on One Leg Tries to lift leg/unable to  hold 3 seconds but remains standing independently    Total Score 45      Dynamic Gait Index   Level Surface Mild Impairment   8.47   Change in Gait Speed Mild Impairment    Gait with Horizontal Head Turns Mild Impairment   11.63   Gait with Vertical Head Turns Mild Impairment   9.97   Gait and Pivot Turn Mild Impairment   4.5   Step Over Obstacle  Severe Impairment    Step Around Obstacles Mild Impairment    Steps Moderate Impairment    Total Score 13      Self-Care   Self-Care Other Self-Care Comments    Other Self-Care Comments  Discussed progress towards goals and POC; discussed that pain seems to be being managed with her exercises and stretches and her balance is improving but still at increased fall risk per DGI score.  Discussed continueing with PT at reduced, 1x/wk, frequency to address dynamic balance, as pt is returning to work 1/2 days.  Pt/husband in agreement.                     PT Education - 10/06/20 1236     Education Details PT POC and progress towards goals    Person(s) Educated Patient;Spouse    Methods Explanation    Comprehension Verbalized understanding              PT Short Term Goals - 09/13/20 1510       PT SHORT TERM GOAL #1   Title STGs= LTGs               PT Long Term Goals - 10/05/20 1050       PT LONG TERM GOAL #1   Title Pt will be independent with progression of HEP for improved flexibility,strength, pain, transfers, and gait.  TARGET 10/08/20    Baseline 10/05/20:  initial standing exercises have been added and pt could benefit from additional balance exercises; pt verbally reports and has demo understanding of HEP    Time 4    Period Weeks    Status Achieved      PT LONG TERM GOAL #2   Title Pt will improve gait velocity to at least 2.62 ft/sec with RW or cane for improved gait efficiency and safety.    Baseline 09/08/20: 2.58 ft/sec with RW; 2.88 ft/sec 10/05/2020    Time 4    Period Weeks    Status Achieved       PT LONG TERM GOAL #3   Title Pt will ambulate at least 100 ft, supervision, with cane, for imrpoved household mobility.  GOAL MET; revised:  Pt to ambulate 350 ft with cane, mod I for improved gait efficiency, safety, and independence.    Baseline 09/10/20: with min HHA  for 115 feet with straight cane; 230 ft straight cane supervision, 09/24/2020; 400 ft mod I indoor surfaces with cane    Time 4    Period Weeks    Status Achieved      PT LONG TERM GOAL #4   Title Berg score to improve to at least 40/56 for decreased fall risk.    Baseline 09/10/20: 36/56 scored today, improved from 30/56 just not to goal level; 45/56 10/05/2020    Time 4    Period Weeks    Status Achieved              New LTGs for recert:   PT Long Term Goals - 10/06/20 1252       PT LONG TERM GOAL #1   Title Pt will be independent with final progression of HEP for improved flexibility,strength, pain, transfers, and gait.  TARGET 11/05/20    Baseline 10/05/20:  initial standing exercises have been added and pt could benefit from additional balance exercises; pt verbally reports and has demo understanding of HEP    Time 4    Period Weeks    Status Revised  PT LONG TERM GOAL #2   Title Pt will ambulate at least 1000 ft, indoor and outdoor surfaces, independently, no LOB for imrpoved community gait.    Time 4    Period Weeks    Status New      PT LONG TERM GOAL #3   Title With transition to cane from RW, pt/husband will verbalize understanding of fall prevention in home and community setting.    Time 4    Period Weeks    Status New      PT LONG TERM GOAL #4   Title Pt will improve DGI score to at least 18/24 for decreased fall risk.    Baseline 13/24 10/05/20    Time 4    Period Weeks    Status New                 Plan - 10/06/20 1242     Clinical Impression Statement Assessed LTGs today, with pt making significant progress with improved balance and transition from RW to cane.  She has met  all 4 of 4 LTGs.  DGI was performed today, and pt's score of 13/24 indicates increased fall risk.  As she has transitioned to primarily using cane, she does demonstrate unsteadiness with head motions, turns, and obstacles with gait.  She would continue to benefit from skilled PT to work on higher level balance and gait for improved independence and safety with gait as she transitions back to work and community activities.  She updated reccert and LTGs for details.    Personal Factors and Comorbidities Comorbidity 3+    Comorbidities PMH:  chronic lumbar radiculopathy, RA, GERD, obesity, lymphadenopathy, 07/22/20:MRI showed mild progression of L recess/foraminal protrusion at L3-4    Examination-Activity Limitations Locomotion Level;Transfers;Stand    Examination-Participation Restrictions Occupation;Community Activity    Stability/Clinical Decision Making Evolving/Moderate complexity    Rehab Potential Good    PT Frequency 1x / week    PT Duration 4 weeks   per recert 5/91/6384   PT Treatment/Interventions ADLs/Self Care Home Management;Gait training;Electrical Stimulation;Cryotherapy;DME Instruction;Neuromuscular re-education;Balance training;Therapeutic exercise;Therapeutic activities;Functional mobility training;Moist Heat;Traction;Ultrasound;Patient/family education;Manual techniques;Passive range of motion    PT Next Visit Plan Standing balance exercises added to HEP,head turns, environmental scanning, variable surfaces progression of gait with cane on level/compliant surfaces; try outdoor surfaces and increased distance with cane    PT Home Exercise Plan [email or web address] Code: [email or web address]    Consulted and Agree with Plan of Care Patient;Family member/caregiver    Family Member Consulted husband             Patient will benefit from skilled therapeutic intervention in order to improve the following deficits and impairments:  Abnormal gait, Decreased balance, Decreased range of motion, Difficulty  walking, Pain, Postural dysfunction, Decreased strength, Decreased mobility  Visit Diagnosis: Other abnormalities of gait and mobility  Unsteadiness on feet  Muscle weakness (generalized)  Abnormal posture  Chronic bilateral low back pain with bilateral sciatica     Problem List Patient Active Problem List   Diagnosis Date Noted   Impaired ambulation 07/21/2020   Lumbosacral radiculopathy    Weakness of both lower extremities    IDA (iron deficiency anemia) 05/24/2020   Symptomatic cholelithiasis 01/31/2011   ALLERGIC RHINITIS 07/02/2008   HEADACHE, CHRONIC 07/02/2008   DYSPNEA 07/02/2008   COUGH 07/02/2008   OBESITY 07/01/2008   SICKLE CELL TRAIT 07/01/2008   OBSESSIVE-COMPULSIVE DISORDER 07/01/2008   DEPRESSION 07/01/2008    Raynie Steinhaus W., PT 10/06/2020, 12:52 PM Mady Haagensen W., PT  [facility] [address] [place], [place], [address] Phone: [phone]   Fax:  [phone]  Name: Josslin Sanjuan MRN: [id number] Date of Birth: [birth year]

## 2020-10-12 ENCOUNTER — Encounter: Payer: Self-pay | Admitting: Physical Therapy

## 2020-10-12 ENCOUNTER — Ambulatory Visit: Payer: 59 | Admitting: Physical Therapy

## 2020-10-12 ENCOUNTER — Other Ambulatory Visit: Payer: Self-pay

## 2020-10-12 DIAGNOSIS — R293 Abnormal posture: Secondary | ICD-10-CM

## 2020-10-12 DIAGNOSIS — R2689 Other abnormalities of gait and mobility: Secondary | ICD-10-CM

## 2020-10-12 DIAGNOSIS — M5442 Lumbago with sciatica, left side: Secondary | ICD-10-CM

## 2020-10-12 DIAGNOSIS — G8929 Other chronic pain: Secondary | ICD-10-CM

## 2020-10-12 DIAGNOSIS — R2681 Unsteadiness on feet: Secondary | ICD-10-CM

## 2020-10-12 DIAGNOSIS — M6281 Muscle weakness (generalized): Secondary | ICD-10-CM

## 2020-10-12 NOTE — Therapy (Signed)
[facility] [address] [place], [place], [address] Phone: [phone]   Fax:  [phone]  Physical Therapy Treatment  Patient Details  Name: Miranda Padilla MRN: [id number] Date of Birth: Apr 23, 1965 Referring Provider (PT): (Dr. Corey Harold   Encounter Date: 10/12/2020   PT End of Session - 10/12/20 2032     Visit Number 19    Number of Visits 22    Date for PT Re-Evaluation 11/05/20    Authorization Type UHC    PT Start Time 1022    PT Stop Time 1102    PT Time Calculation (min) 40 min    Activity Tolerance Patient tolerated treatment well;No increased pain   Pt rates pain decrease to 3/10 at end of session   Behavior During Therapy [facility] for tasks assessed/performed             Past Medical History:  Diagnosis Date   Abdominal distension    Abdominal pain    Anemia    Anxiety    Arthritis    Asthma    Bronchitis    Constipation    Cough    Depression    Family history of adverse reaction to anesthesia    Pt mother would wake up during procedures   GERD (gastroesophageal reflux disease)    Headache(784.0)    Nausea & vomiting    Obsessive compulsive disorder    not on any medication at this time   Sickle cell trait ([place])    Thalassemia     Past Surgical History:  Procedure Laterality Date   AXILLARY LYMPH NODE BIOPSY Right 07/01/2020   Procedure: EXCISIONAL BIOPSY RIGHT AXILLARY LYMPH NODE;  Surgeon: Coralie Keens, MD;  Location: [place];  Service: General;  Laterality: Right;   CHOLECYSTECTOMY  02/03/2011   Procedure: LAPAROSCOPIC CHOLECYSTECTOMY;  Surgeon: Harl Bowie, MD;  Location: [place];  Service: General;  Laterality: N/A;  Laparoscopic Choleystectomy   COLONOSCOPY WITH PROPOFOL N/A 06/01/2016   Procedure: COLONOSCOPY WITH PROPOFOL;  Surgeon: Ronnette Juniper, MD;  Location: [place];  Service: Gastroenterology;  Laterality: N/A;   MOUTH SURGERY  2000     There were no vitals filed for this visit.   Subjective Assessment - 10/12/20 1025     Subjective Went through a full week of work (therapist thought half days from what pt said last week)-had a lot of terrible pain and fatigue.  Ran out of Gabapentin, and tried to go without it, but the pain was terrible.  Got the Gabapentin last night.  Pt does report that she started full days on Wednesday last week; PT was unaware of this.    Patient is accompained by: Family member   spouse   Pertinent History Had been going thorugh assessment of lymph nodes to r/o cancer (no cancer per pt report); NCV normal    Limitations Standing;Walking;House hold activities    Diagnostic tests MRI mild progression of L recess/foraminal protrusion of L3-4    Patient Stated Goals Pt's goals are to get back to walking independently.    Currently in Pain? Yes    Pain Score 6     Pain Location Back    Pain Orientation Right    Pain Descriptors / Indicators Spasm;Tightness    Pain Type Acute pain;Neuropathic pain    Pain Onset More than a month ago    Pain Frequency Intermittent    Aggravating Factors  increased activity-going back to full day work  Pain Relieving Factors stretching    Pain Onset More than a month ago                               [facility] Adult PT Treatment/Exercise - 10/12/20 0001       Ambulation/Gait   Ambulation/Gait Yes    Ambulation/Gait Assistance 5: Supervision    Ambulation/Gait Assistance Details In second lap of gait in gym, incorporated environmental scanning tasks, with minor slowing of gait, no LOB.    Ambulation Distance (Feet) 80 Feet   40 ft, then 230 ft   Assistive device Straight cane    Gait Pattern Step-through pattern;Decreased step length - right;Decreased step length - left;Decreased stride length;Decreased hip/knee flexion - right;Decreased hip/knee flexion - left;Decreased dorsiflexion - right;Decreased dorsiflexion - left;Right foot flat;Left  foot flat;Wide base of support    Ambulation Surface Level;Indoor      Self-Care   Self-Care Other Self-Care Comments    Other Self-Care Comments  Discussed pt's quick increase in activity level to full work days as well as pt's increased back, foot pain and fatigue levels.  Discussed trying to find the balance of work and activity with rest and stretching/exercise strategies to allow for improved functional mobility/avoiding increased pain levels that we have worked hard in therapy to improve.  Discussed posture, positioning, stretching in work environment.      Exercises   Exercises Other Exercises    Other Exercises  REviewed standing stagger stance forward/back rocking x 10 reps and lateral weightshifting x 5 reps      Lumbar Exercises: Stretches   Other Lumbar Stretch Exercise Standing L-stretch at counter, wide BOS with ant/posterior rocking for hamstring/gastroc stretch, 5 reps 10 sec.                 Balance Exercises - 10/12/20 0001       Balance Exercises: Standing   Standing Eyes Opened Wide (BOA);Narrow base of support (BOS);Solid surface;Limitations    Standing Eyes Opened Limitations head turns x 5, head nods x 5, UE support at counter    Standing Eyes Closed Wide (BOA);Narrow base of support (BOS);Solid surface;Limitations    Standing Eyes Closed Limitations Head turns x 5, head nods x 5 support at counter    Gait with Head Turns Forward;4 reps;Upper extremity support;Limitations    Gait with Head Turns Limitations Along counter, then 2 reps with head nods UE support    Retro Gait 3 reps;Upper extremity support;Limitations    Retro Gait Limitations Forward/back walking; cues for foot placement as pt has tremulous/uncontrolled foot placement initially in posterior position.                PT Education - 10/12/20 2032     Education Details See self-care (provided handouts from [employer] on posture/positioning at work station)    [employer]) Educated  Patient;Spouse    Methods Explanation;Demonstration    Comprehension Verbalized understanding;Returned demonstration              PT Short Term Goals - 09/13/20 1510       PT SHORT TERM GOAL #1   Title STGs= LTGs               PT Long Term Goals - 10/06/20 1252       PT LONG TERM GOAL #1   Title Pt will be independent with final progression of HEP for improved flexibility,strength, pain, transfers, and gait.  TARGET 11/05/20    Baseline 10/05/20:  initial standing exercises have been added and pt could benefit from additional balance exercises; pt verbally reports and has demo understanding of HEP    Time 4    Period Weeks    Status Revised      PT LONG TERM GOAL #2   Title Pt will ambulate at least 1000 ft, indoor and outdoor surfaces, independently, no LOB for imrpoved community gait.    Time 4    Period Weeks    Status New      PT LONG TERM GOAL #3   Title With transition to cane from RW, pt/husband will verbalize understanding of fall prevention in home and community setting.    Time 4    Period Weeks    Status New      PT LONG TERM GOAL #4   Title Pt will improve DGI score to at least 18/24 for decreased fall risk.    Baseline 13/24 10/05/20    Time 4    Period Weeks    Status New                   Plan - 10/12/20 2033     Clinical Impression Statement Pt presents today with increased pain and discomfort as well as more uncoordinated movements today; she reports starting back to 8-hr work days on Wednesday of last week.  Had frank discussion with pt about balance of increasing activity with continueing to perform HEP, optimal posture at work, and strategies to lessen pain (get up from chair at least once per hour, use RW at work if needed vs cane).  Ended session with gentle balance exercises and she is able to smoothly incoporate head turns/environmental scanning into gait without LOB.  Did not add any balance exercises today due to pt's increased  fatigue level and pain since return to Will continue to benefit from skilled PT to fruther address balance, strength, gait towards LTGs.    Personal Factors and Comorbidities Comorbidity 3+    Comorbidities PMH:  chronic lumbar radiculopathy, RA, GERD, obesity, lymphadenopathy, 07/22/20:MRI showed mild progression of L recess/foraminal protrusion at L3-4    Examination-Activity Limitations Locomotion Level;Transfers;Stand    Examination-Participation Restrictions Occupation;Community Activity    Stability/Clinical Decision Making Evolving/Moderate complexity    Rehab Potential Good    PT Frequency 1x / week    PT Duration 4 weeks   per recert 4/96/7591   PT Treatment/Interventions ADLs/Self Care Home Management;Gait training;Electrical Stimulation;Cryotherapy;DME Instruction;Neuromuscular re-education;Balance training;Therapeutic exercise;Therapeutic activities;Functional mobility training;Moist Heat;Traction;Ultrasound;Patient/family education;Manual techniques;Passive range of motion    PT Next Visit Plan Standing balance exercises added to HEP,head turns, environmental scanning, variable surfaces progression of gait with cane on level/compliant surfaces; try outdoor surfaces and increased distance with cane    PT Home Exercise Plan MedbridgeAccess Code: [email or web address]    Consulted and Agree with Plan of Care Patient;Family member/caregiver    Family Member Consulted husband             Patient will benefit from skilled therapeutic intervention in order to improve the following deficits and impairments:  Abnormal gait, Decreased balance, Decreased range of motion, Difficulty walking, Pain, Postural dysfunction, Decreased strength, Decreased mobility  Visit Diagnosis: Other abnormalities of gait and mobility  Unsteadiness on feet  Muscle weakness (generalized)  Abnormal posture  Chronic bilateral low back pain with bilateral sciatica     Problem List Patient Active Problem List    Diagnosis Date Noted   Impaired  ambulation 07/21/2020   Lumbosacral radiculopathy    Weakness of both lower extremities    IDA (iron deficiency anemia) 05/24/2020   Symptomatic cholelithiasis 01/31/2011   ALLERGIC RHINITIS 07/02/2008   HEADACHE, CHRONIC 07/02/2008   DYSPNEA 07/02/2008   COUGH 07/02/2008   OBESITY 07/01/2008   SICKLE CELL TRAIT 07/01/2008   OBSESSIVE-COMPULSIVE DISORDER 07/01/2008   DEPRESSION 07/01/2008    Jasman Murri W., PT 10/12/2020, 8:38 PM  [facility] [address] [place], [place], [address] Phone: [phone]   Fax:  [phone]  Name: Barbaraann Avans MRN: [id number] Date of Birth: 1965-02-23

## 2020-10-19 ENCOUNTER — Encounter: Payer: Self-pay | Admitting: Physical Therapy

## 2020-10-19 ENCOUNTER — Ambulatory Visit: Payer: 59 | Admitting: Physical Therapy

## 2020-10-19 ENCOUNTER — Other Ambulatory Visit: Payer: Self-pay

## 2020-10-19 DIAGNOSIS — R2681 Unsteadiness on feet: Secondary | ICD-10-CM

## 2020-10-19 DIAGNOSIS — M5442 Lumbago with sciatica, left side: Secondary | ICD-10-CM

## 2020-10-19 DIAGNOSIS — R2689 Other abnormalities of gait and mobility: Secondary | ICD-10-CM

## 2020-10-19 DIAGNOSIS — R293 Abnormal posture: Secondary | ICD-10-CM

## 2020-10-19 DIAGNOSIS — G8929 Other chronic pain: Secondary | ICD-10-CM

## 2020-10-19 DIAGNOSIS — M6281 Muscle weakness (generalized): Secondary | ICD-10-CM

## 2020-10-19 NOTE — Patient Instructions (Signed)
Access Code: [email or web address] URL: [email or web address] Date: 10/19/2020 Prepared by: Willow Ora  Exercises Supine Figure 4 Piriformis Stretch - 1 x daily - 7 x weekly - 3 sets - 10 sec hold Supine Active Straight Leg Raise - 1 x daily - 7 x weekly - 2 sets - 10 reps Supine Sciatic Nerve Glide - 1 x daily - 7 x weekly - 1 sets - 10 reps Supine March with Posterior Pelvic Tilt - 1 x daily - 7 x weekly - 2 sets - 10 reps Clamshell with Resistance - 1 x daily - 7 x weekly - 2 sets - 10 reps Supine Bridge - 1 x daily - 7 x weekly - 1 sets - 10 reps Standing Gastroc Stretch on Step - 1 x daily - 7 x weekly - 2 sets - 20 sec hold Standing Soleus Stretch on Step - 1 x daily - 7 x weekly - 2 sets - 20 sec hold Seated Ankle Plantar Flexion with Resistance Loop - 1 x daily - 7 x weekly - 2 sets - 10 reps CLX Ankle Dorsiflexion and Eversion - 1 x daily - 7 x weekly - 2 sets - 10 reps Seated Ankle Inversion with Anchored Resistance - 1 x daily - 7 x weekly - 3 sets - 10 reps Seated Ankle Inversion Eversion PROM - 1 x daily - 7 x weekly - 2 sets - 30 sec hold Heel Toe Raises with Counter Support - 1 x daily - 7 x weekly - 3 sets - 10 reps  Added to HEP this session: Standing Near Stance in [place] with Eyes Closed - 1 x daily - 5 x weekly - 1 sets - 3 reps - 30 hold Standing Balance in Corner with Eyes Closed - 1 x daily - 5 x weekly - 1 sets - 10 reps Walking with Head Rotation - 1 x daily - 5 x weekly - 1 sets - 2-3 reps

## 2020-10-20 NOTE — Therapy (Signed)
[facility] [address] [place], [place], [address] Phone: [phone]   Fax:  [phone]  Physical Therapy Treatment  Patient Details  Name: Miranda Padilla MRN: [id number] Date of Birth: [birth year] Referring Provider (PT): (Dr. Corey Harold   Encounter Date: 10/19/2020   PT End of Session - 10/19/20 1025     Visit Number 20    Number of Visits 22    Date for PT Re-Evaluation 11/05/20    Authorization Type UHC    PT Start Time 1019    PT Stop Time 1100    PT Time Calculation (min) 41 min    Equipment Utilized During Treatment Gait belt    Activity Tolerance Patient tolerated treatment well;No increased pain   Pt rates pain decrease to 3/10 at end of session   Behavior During Therapy [facility] for tasks assessed/performed             Past Medical History:  Diagnosis Date   Abdominal distension    Abdominal pain    Anemia    Anxiety    Arthritis    Asthma    Bronchitis    Constipation    Cough    Depression    Family history of adverse reaction to anesthesia    Pt mother would wake up during procedures   GERD (gastroesophageal reflux disease)    Headache(784.0)    Nausea & vomiting    Obsessive compulsive disorder    not on any medication at this time   Sickle cell trait ([place])    Thalassemia     Past Surgical History:  Procedure Laterality Date   AXILLARY LYMPH NODE BIOPSY Right 07/01/2020   Procedure: EXCISIONAL BIOPSY RIGHT AXILLARY LYMPH NODE;  Surgeon: Coralie Keens, MD;  Location: [place];  Service: General;  Laterality: Right;   CHOLECYSTECTOMY  02/03/2011   Procedure: LAPAROSCOPIC CHOLECYSTECTOMY;  Surgeon: Harl Bowie, MD;  Location: [place];  Service: General;  Laterality: N/A;  Laparoscopic Choleystectomy   COLONOSCOPY WITH PROPOFOL N/A 06/01/2016   Procedure: COLONOSCOPY WITH PROPOFOL;  Surgeon: Ronnette Juniper, MD;  Location: [place];  Service:  Gastroenterology;  Laterality: N/A;   MOUTH SURGERY  2000    There were no vitals filed for this visit.   Subjective Assessment - 10/19/20 1021     Subjective Has changed her working position and is taking more breaks which is helping. Also not using FMLA to decrease from 40 hours to 35-36 hours. Continues to complain of increased pain, mostly in feel, tailbone and lower back. Also noting increased fatigue overall, mostly in her upper back. Unsure if it's the return to work or her iron levels as they have not been checked recently or a combination of the two. Does plan to take today off since at therapy.    Patient is accompained by: Family member   spouse   Pertinent History Had been going thorugh assessment of lymph nodes to r/o cancer (no cancer per pt report); NCV normal    Limitations Standing;Walking;House hold activities    Diagnostic tests MRI mild progression of L recess/foraminal protrusion of L3-4    Patient Stated Goals Pt's goals are to get back to walking independently.    Currently in Pain? Yes    Pain Score 7     Pain Location Back    Pain Orientation Right    Pain Descriptors / Indicators Spasm;Tightness    Pain Type Acute pain;Neuropathic pain    Pain Onset  More than a month ago    Pain Frequency Intermittent    Aggravating Factors  increased activity- going back to working    Pain Relieving Factors stretching, resting, changing position                   [facility] Adult PT Treatment/Exercise - 10/19/20 1026       Transfers   Transfers Sit to Stand;Stand to Sit    Sit to Stand 5: Supervision;With upper extremity assist;From bed;From chair/3-in-1    Stand to Sit 5: Supervision;With upper extremity assist;To bed;To chair/3-in-1      Ambulation/Gait   Ambulation/Gait Yes    Ambulation/Gait Assistance 5: Supervision    Ambulation Distance (Feet) --   around clinic with session   Assistive device Straight cane    Gait Pattern Step-through pattern;Decreased  step length - right;Decreased step length - left;Decreased stride length;Decreased hip/knee flexion - right;Decreased hip/knee flexion - left;Decreased dorsiflexion - right;Decreased dorsiflexion - left;Right foot flat;Left foot flat;Wide base of support    Ambulation Surface Level;Indoor      Neuro Re-ed    Neuro Re-ed Details  for balance/muscle re-ed: added standing balance ex's to HEP this session. Refer to [place] for full details. Min guard assist for safety.      Lumbar Exercises: Aerobic   Recumbent Bike Scifit UE/LE's level 3.5 for 8 minutes with goal >/= 40 steps per minute for strengthening, reciprocal movements and stretching with full knee extension emphasized.             Issued to HEP this session:  Access Code: [email or web address] URL: [email or web address] Date: 10/19/2020 Prepared by: Willow Ora  Exercises Supine Figure 4 Piriformis Stretch - 1 x daily - 7 x weekly - 3 sets - 10 sec hold Supine Active Straight Leg Raise - 1 x daily - 7 x weekly - 2 sets - 10 reps Supine Sciatic Nerve Glide - 1 x daily - 7 x weekly - 1 sets - 10 reps Supine March with Posterior Pelvic Tilt - 1 x daily - 7 x weekly - 2 sets - 10 reps Clamshell with Resistance - 1 x daily - 7 x weekly - 2 sets - 10 reps Supine Bridge - 1 x daily - 7 x weekly - 1 sets - 10 reps Standing Gastroc Stretch on Step - 1 x daily - 7 x weekly - 2 sets - 20 sec hold Standing Soleus Stretch on Step - 1 x daily - 7 x weekly - 2 sets - 20 sec hold Seated Ankle Plantar Flexion with Resistance Loop - 1 x daily - 7 x weekly - 2 sets - 10 reps CLX Ankle Dorsiflexion and Eversion - 1 x daily - 7 x weekly - 2 sets - 10 reps Seated Ankle Inversion with Anchored Resistance - 1 x daily - 7 x weekly - 3 sets - 10 reps Seated Ankle Inversion Eversion PROM - 1 x daily - 7 x weekly - 2 sets - 30 sec hold Heel Toe Raises with Counter Support - 1 x daily - 7 x weekly - 3 sets - 10 reps  Added to HEP this  session: Standing Near Stance in [place] with Eyes Closed - 1 x daily - 5 x weekly - 1 sets - 3 reps - 30 hold Standing Balance in Corner with Eyes Closed - 1 x daily - 5 x weekly - 1 sets - 10 reps Walking with Head Rotation - 1 x daily - 5 x weekly -  1 sets - 2-3 reps      PT Education - 10/19/20 1054     Education Details additions to HEP for balance    Person(s) Educated Patient;Spouse    Methods Explanation;Demonstration;Verbal cues;Handout    Comprehension Verbalized understanding;Returned demonstration;Verbal cues required;Need further instruction              PT Short Term Goals - 09/13/20 1510       PT SHORT TERM GOAL #1   Title STGs= LTGs               PT Long Term Goals - 10/06/20 1252       PT LONG TERM GOAL #1   Title Pt will be independent with final progression of HEP for improved flexibility,strength, pain, transfers, and gait.  TARGET 11/05/20    Baseline 10/05/20:  initial standing exercises have been added and pt could benefit from additional balance exercises; pt verbally reports and has demo understanding of HEP    Time 4    Period Weeks    Status Revised      PT LONG TERM GOAL #2   Title Pt will ambulate at least 1000 ft, indoor and outdoor surfaces, independently, no LOB for imrpoved community gait.    Time 4    Period Weeks    Status New      PT LONG TERM GOAL #3   Title With transition to cane from RW, pt/husband will verbalize understanding of fall prevention in home and community setting.    Time 4    Period Weeks    Status New      PT LONG TERM GOAL #4   Title Pt will improve DGI score to at least 18/24 for decreased fall risk.    Baseline 13/24 10/05/20    Time 4    Period Weeks    Status New                   Plan - 10/19/20 1026     Personal Factors and Comorbidities Comorbidity 3+    Comorbidities PMH:  chronic lumbar radiculopathy, RA, GERD, obesity, lymphadenopathy, 07/22/20:MRI showed mild progression of L  recess/foraminal protrusion at L3-4    Examination-Activity Limitations Locomotion Level;Transfers;Stand    Examination-Participation Restrictions Occupation;Community Activity    Stability/Clinical Decision Making Evolving/Moderate complexity    Rehab Potential Good    PT Frequency 1x / week    PT Duration 4 weeks   per recert 07/31/6281   PT Treatment/Interventions ADLs/Self Care Home Management;Gait training;Electrical Stimulation;Cryotherapy;DME Instruction;Neuromuscular re-education;Balance training;Therapeutic exercise;Therapeutic activities;Functional mobility training;Moist Heat;Traction;Ultrasound;Patient/family education;Manual techniques;Passive range of motion    PT Next Visit Plan Standing balance exercises added to HEP,head turns, environmental scanning, variable surfaces progression of gait with cane on level/compliant surfaces; try outdoor surfaces and increased distance with cane    PT Home Exercise Plan MedbridgeAccess Code: [email or web address]    Consulted and Agree with Plan of Care Patient;Family member/caregiver    Family Member Consulted husband             Patient will benefit from skilled therapeutic intervention in order to improve the following deficits and impairments:  Abnormal gait, Decreased balance, Decreased range of motion, Difficulty walking, Pain, Postural dysfunction, Decreased strength, Decreased mobility  Visit Diagnosis: Other abnormalities of gait and mobility  Unsteadiness on feet  Muscle weakness (generalized)  Abnormal posture  Chronic bilateral low back pain with bilateral sciatica     Problem List Patient Active Problem List   Diagnosis  Date Noted   Impaired ambulation 07/21/2020   Lumbosacral radiculopathy    Weakness of both lower extremities    IDA (iron deficiency anemia) 05/24/2020   Symptomatic cholelithiasis 01/31/2011   ALLERGIC RHINITIS 07/02/2008   HEADACHE, CHRONIC 07/02/2008   DYSPNEA 07/02/2008   COUGH 07/02/2008   OBESITY  07/01/2008   SICKLE CELL TRAIT 07/01/2008   OBSESSIVE-COMPULSIVE DISORDER 07/01/2008   DEPRESSION 07/01/2008    Willow Ora, PTA 10/20/2020, 9:47 AM  [facility] [address] [place], [place], [address] Phone: [phone]   Fax:  [phone]  Name: Miranda Padilla MRN: [id number] Date of Birth: August 30, 1965

## 2020-10-26 ENCOUNTER — Ambulatory Visit: Payer: 59 | Attending: Internal Medicine | Admitting: Physical Therapy

## 2020-10-26 ENCOUNTER — Other Ambulatory Visit: Payer: Self-pay

## 2020-10-26 ENCOUNTER — Encounter: Payer: Self-pay | Admitting: Physical Therapy

## 2020-10-26 DIAGNOSIS — M6281 Muscle weakness (generalized): Secondary | ICD-10-CM | POA: Diagnosis present

## 2020-10-26 DIAGNOSIS — R2689 Other abnormalities of gait and mobility: Secondary | ICD-10-CM | POA: Insufficient documentation

## 2020-10-26 DIAGNOSIS — R293 Abnormal posture: Secondary | ICD-10-CM | POA: Diagnosis present

## 2020-10-26 DIAGNOSIS — R2681 Unsteadiness on feet: Secondary | ICD-10-CM | POA: Insufficient documentation

## 2020-10-26 NOTE — Therapy (Signed)
[facility] [address] [place], [place], [address] Phone: [phone]   Fax:  [phone]  Physical Therapy Treatment  Patient Details  Name: Miranda Padilla MRN: [id number] Date of Birth: 09/17/1965 Referring Provider (PT): (Dr. Corey Harold   Encounter Date: 10/26/2020   PT End of Session - 10/26/20 1155     Visit Number 21    Number of Visits 22    Date for PT Re-Evaluation 11/05/20    Authorization Type UHC    PT Start Time 1021    PT Stop Time 1100    PT Time Calculation (min) 39 min    Equipment Utilized During Treatment Gait belt    Activity Tolerance Patient tolerated treatment well;No increased pain    Behavior During Therapy WFL for tasks assessed/performed             Past Medical History:  Diagnosis Date   Abdominal distension    Abdominal pain    Anemia    Anxiety    Arthritis    Asthma    Bronchitis    Constipation    Cough    Depression    Family history of adverse reaction to anesthesia    Pt mother would wake up during procedures   GERD (gastroesophageal reflux disease)    Headache(784.0)    Nausea & vomiting    Obsessive compulsive disorder    not on any medication at this time   Sickle cell trait ([place])    Thalassemia     Past Surgical History:  Procedure Laterality Date   AXILLARY LYMPH NODE BIOPSY Right 07/01/2020   Procedure: EXCISIONAL BIOPSY RIGHT AXILLARY LYMPH NODE;  Surgeon: Coralie Keens, MD;  Location: [place];  Service: General;  Laterality: Right;   CHOLECYSTECTOMY  02/03/2011   Procedure: LAPAROSCOPIC CHOLECYSTECTOMY;  Surgeon: Harl Bowie, MD;  Location: [facility];  Service: General;  Laterality: N/A;  Laparoscopic Choleystectomy   COLONOSCOPY WITH PROPOFOL N/A 06/01/2016   Procedure: COLONOSCOPY WITH PROPOFOL;  Surgeon: Ronnette Juniper, MD;  Location: [facility];  Service: Gastroenterology;  Laterality: N/A;   MOUTH SURGERY  2000     There were no vitals filed for this visit.   Subjective Assessment - 10/26/20 1024     Subjective Feet having more tingling and more back pain.  It's been the trend since being back to work.  I have made a lot of progress in therapy-more than I ever thought when I started.    Patient is accompained by: Family member   spouse   Pertinent History Had been going thorugh assessment of lymph nodes to r/o cancer (no cancer per pt report); NCV normal    Limitations Standing;Walking;House hold activities    Diagnostic tests MRI mild progression of L recess/foraminal protrusion of L3-4    Patient Stated Goals Pt's goals are to get back to walking independently.    Currently in Pain? Yes    Pain Score 5     Pain Location Back    Pain Orientation Right;Left;Lower    Pain Descriptors / Indicators Spasm;Tightness    Pain Type Acute pain    Pain Onset More than a month ago    Pain Frequency Intermittent    Aggravating Factors  increased activity-going back to work    Pain Relieving Factors stretching, resting, changing positions    Multiple Pain Sites Yes    Pain Score 7    Pain Location Foot    Pain Orientation Right;Left  Pain Descriptors / Indicators Tightness;Tingling;Sharp    Pain Onset More than a month ago    Pain Frequency Constant    Aggravating Factors  unsure    Pain Relieving Factors warmth, exercises                               [facility] Adult PT Treatment/Exercise - 10/26/20 0001       Ambulation/Gait   Ambulation/Gait Yes    Ambulation/Gait Assistance 6: Modified independent (Device/Increase time)    Ambulation Distance (Feet) 1000 Feet    Assistive device Straight cane    Gait Pattern Step-through pattern;Decreased step length - right;Decreased step length - left;Wide base of support    Ambulation Surface Level;Indoor    Gait Comments Outdoor surfaces on sidewalk and pavement, including incline, decline, curb step and environmental scanning, no  LOB noted.      High Level Balance   High Level Balance Activities Negotitating around obstacles;Negotiating over obstacles    High Level Balance Comments Stepping over obstacles and around obstacles, using cane, initially taking extra time, then with repetition, decreased time and effort for stepping over obstacles.             Reviewed HEP from last visit:  pt return demo understanding, minimal cues provided. Standing Near Stance in [place] with Eyes Closed - 1 x daily - 5 x weekly - 1 sets - 3 reps - 30 hold Standing Balance in Corner with Eyes Closed - 1 x daily - 5 x weekly - 1 sets - 10 reps (cues for holding to chair lightly to avoid guarding with UEs) Walking with Head Rotation - 1 x daily - 5 x weekly - 1 sets - 2-3 reps           PT Education - 10/26/20 1154     Education Details Discussed POC, progress towards goals so far, plans for d/c next visit.    Person(s) Educated Patient;Spouse    Methods Explanation    Comprehension Verbalized understanding              PT Short Term Goals - 09/13/20 1510       PT SHORT TERM GOAL #1   Title STGs= LTGs               PT Long Term Goals - 10/26/20 1051       PT LONG TERM GOAL #1   Title Pt will be independent with final progression of HEP for improved flexibility,strength, pain, transfers, and gait.  TARGET 11/05/20    Baseline 10/05/20:  initial standing exercises have been added and pt could benefit from additional balance exercises; pt verbally reports and has demo understanding of HEP    Time 4    Period Weeks    Status Achieved      PT LONG TERM GOAL #2   Title Pt will ambulate at least 1000 ft, indoor and outdoor surfaces, independently, no LOB for imrpoved community gait.    Baseline 1000 ft outdoors with cane and no LOB 10/26/2020    Time 4    Period Weeks    Status Achieved      PT LONG TERM GOAL #3   Title With transition to cane from RW, pt/husband will verbalize understanding of fall  prevention in home and community setting.    Time 4    Period Weeks    Status New  PT LONG TERM GOAL #4   Title Pt will improve DGI score to at least 18/24 for decreased fall risk.    Baseline 13/24 10/05/20    Time 4    Period Weeks    Status New                   Plan - 10/26/20 1155     Clinical Impression Statement Reviewed HEP and performed longer distance outdoor gait activities, with pt meeting LTG 1 and 2 today.  Pt has made excellent progress during the course of therapy overall, despite some ongoing pain and discomfort in low back and feet.  She verbalizes continued/consistent performance of HEP and strategies to help manage pain.  Pt is on target to meet remaining LTGs, and she is in agreement with d/c from PT next visit.    Personal Factors and Comorbidities Comorbidity 3+    Comorbidities PMH:  chronic lumbar radiculopathy, RA, GERD, obesity, lymphadenopathy, 07/22/20:MRI showed mild progression of L recess/foraminal protrusion at L3-4    Examination-Activity Limitations Locomotion Level;Transfers;Stand    Examination-Participation Restrictions Occupation;Community Activity    Stability/Clinical Decision Making Evolving/Moderate complexity    Rehab Potential Good    PT Frequency 1x / week    PT Duration 4 weeks   per recert 05/04/8784   PT Treatment/Interventions ADLs/Self Care Home Management;Gait training;Electrical Stimulation;Cryotherapy;DME Instruction;Neuromuscular re-education;Balance training;Therapeutic exercise;Therapeutic activities;Functional mobility training;Moist Heat;Traction;Ultrasound;Patient/family education;Manual techniques;Passive range of motion    PT Next Visit Plan Check remaining LTGs and plan for d/c next visit    PT Home Exercise Plan MedbridgeAccess Code: [email or web address]    Consulted and Agree with Plan of Care Patient;Family member/caregiver    Family Member Consulted husband             Patient will benefit from skilled therapeutic  intervention in order to improve the following deficits and impairments:  Abnormal gait, Decreased balance, Decreased range of motion, Difficulty walking, Pain, Postural dysfunction, Decreased strength, Decreased mobility  Visit Diagnosis: Other abnormalities of gait and mobility  Unsteadiness on feet     Problem List Patient Active Problem List   Diagnosis Date Noted   Impaired ambulation 07/21/2020   Lumbosacral radiculopathy    Weakness of both lower extremities    IDA (iron deficiency anemia) 05/24/2020   Symptomatic cholelithiasis 01/31/2011   ALLERGIC RHINITIS 07/02/2008   HEADACHE, CHRONIC 07/02/2008   DYSPNEA 07/02/2008   COUGH 07/02/2008   OBESITY 07/01/2008   SICKLE CELL TRAIT 07/01/2008   OBSESSIVE-COMPULSIVE DISORDER 07/01/2008   DEPRESSION 07/01/2008    Tab Rylee W., PT 10/26/2020, 12:01 PM  [facility] [address] [place], [place], [address] Phone: [phone]   Fax:  [phone]  Name: Miranda Padilla MRN: [id number] Date of Birth: Apr 29, 1965

## 2020-11-02 ENCOUNTER — Ambulatory Visit: Payer: 59 | Admitting: Physical Therapy

## 2020-11-02 ENCOUNTER — Encounter: Payer: Self-pay | Admitting: Physical Therapy

## 2020-11-02 ENCOUNTER — Other Ambulatory Visit: Payer: Self-pay

## 2020-11-02 DIAGNOSIS — R293 Abnormal posture: Secondary | ICD-10-CM

## 2020-11-02 DIAGNOSIS — R2689 Other abnormalities of gait and mobility: Secondary | ICD-10-CM

## 2020-11-02 DIAGNOSIS — R2681 Unsteadiness on feet: Secondary | ICD-10-CM

## 2020-11-02 DIAGNOSIS — M6281 Muscle weakness (generalized): Secondary | ICD-10-CM

## 2020-11-02 NOTE — Therapy (Signed)
[facility] [address] [place], [place], [address] Phone: [phone]   Fax:  [phone]  Physical Therapy Treatment  Patient Details  Name: Miranda Padilla MRN: [id number] Date of Birth: 1965-03-26 Referring Provider (PT): (Dr. Corey Harold   Encounter Date: 11/02/2020   PT End of Session - 11/02/20 1024     Visit Number 22    Number of Visits 22    Date for PT Re-Evaluation 11/05/20    Authorization Type UHC    PT Start Time 1017    PT Stop Time 1055    PT Time Calculation (min) 38 min    Equipment Utilized During Treatment Gait belt    Activity Tolerance Patient tolerated treatment well;No increased pain    Behavior During Therapy WFL for tasks assessed/performed             Past Medical History:  Diagnosis Date   Abdominal distension    Abdominal pain    Anemia    Anxiety    Arthritis    Asthma    Bronchitis    Constipation    Cough    Depression    Family history of adverse reaction to anesthesia    Pt mother would wake up during procedures   GERD (gastroesophageal reflux disease)    Headache(784.0)    Nausea & vomiting    Obsessive compulsive disorder    not on any medication at this time   Sickle cell trait ([place])    Thalassemia     Past Surgical History:  Procedure Laterality Date   AXILLARY LYMPH NODE BIOPSY Right 07/01/2020   Procedure: EXCISIONAL BIOPSY RIGHT AXILLARY LYMPH NODE;  Surgeon: Coralie Keens, MD;  Location: [facility];  Service: General;  Laterality: Right;   CHOLECYSTECTOMY  02/03/2011   Procedure: LAPAROSCOPIC CHOLECYSTECTOMY;  Surgeon: Harl Bowie, MD;  Location: [facility];  Service: General;  Laterality: N/A;  Laparoscopic Choleystectomy   COLONOSCOPY WITH PROPOFOL N/A 06/01/2016   Procedure: COLONOSCOPY WITH PROPOFOL;  Surgeon: Ronnette Juniper, MD;  Location: [place];  Service: Gastroenterology;  Laterality: N/A;   MOUTH SURGERY  2000     There were no vitals filed for this visit.   Subjective Assessment - 11/02/20 1021     Subjective No new complaints. Pain is "okay" today as she did not work this morning. Has spoken to Dr. Davy Pique at [facility] and the plan if for another more invasive procedure/injection for pain reduction- see's him in about 2 weeks to discuss this.    Patient is accompained by: --   spouse   Pertinent History Had been going thorugh assessment of lymph nodes to r/o cancer (no cancer per pt report); NCV normal    Limitations Standing;Walking;House hold activities    Diagnostic tests MRI mild progression of L recess/foraminal protrusion of L3-4    Patient Stated Goals Pt's goals are to get back to walking independently.    Currently in Pain? Yes    Pain Location Back    Pain Orientation Right;Left;Lower    Pain Descriptors / Indicators Aching;Tightness;Spasm    Pain Type Acute pain    Pain Onset More than a month ago    Pain Frequency Intermittent    Aggravating Factors  increased activity-going back to work    Pain Relieving Factors stretching, resting, changing positions                    [facility] Adult PT Treatment/Exercise - 11/02/20 1025  Transfers   Transfers Sit to Stand;Stand to Sit    Sit to Stand 6: Modified independent (Device/Increase time)    Stand to Sit 6: Modified independent (Device/Increase time)      Ambulation/Gait   Ambulation/Gait Yes    Ambulation/Gait Assistance 6: Modified independent (Device/Increase time)    Ambulation Distance (Feet) --   around clinic with session   Assistive device Straight cane    Gait Pattern Step-through pattern;Decreased step length - right;Decreased step length - left;Wide base of support    Ambulation Surface Level;Indoor    Stairs Yes    Stairs Assistance 5: Supervision    Stairs Assistance Details (indicate cue type and reason) rail/cane combo with no balance issues noted.    Stair Management Technique One rail  Right;Step to pattern;Forwards;With cane    Number of Stairs 4    Height of Stairs 6      Dynamic Gait Index   Level Surface Normal    Change in Gait Speed Normal    Gait with Horizontal Head Turns Normal    Gait with Vertical Head Turns Mild Impairment    Gait and Pivot Turn Mild Impairment    Step Over Obstacle Mild Impairment    Step Around Obstacles Normal    Steps Moderate Impairment   rail/cane combo, step to pattern   Total Score 19                 Balance Exercises - 11/02/20 1036       Balance Exercises: Standing   Standing Eyes Closed Narrow base of support (BOS);Wide (BOA);Head turns;Foam/compliant surface;Other reps (comment);30 secs;Limitations    Standing Eyes Closed Limitations on airex with no UE support: feet together for EC 30 sec's x 3 reps, then with feet apart for EC head movements left<>right, up<>down and diagonal both ways for ~10 reps each. min guard to min assist for balance. cues on posture and weight shifting to assist with balance.    SLS with Vectors Foam/compliant surface;Upper extremity assist 2;Other reps (comment);Limitations    SLS with Vectors Limitations on airex with bil UE support on bars, 2 tall cones on floor in front of airex: alternating forward foot taps, then cross foot taps for ~10 reps each. Min guard assist for balance. cues on wieght shifitng.                  PT Short Term Goals - 09/13/20 1510       PT SHORT TERM GOAL #1   Title STGs= LTGs               PT Long Term Goals - 11/02/20 1431       PT LONG TERM GOAL #1   Title Pt will be independent with final progression of HEP for improved flexibility,strength, pain, transfers, and gait.  TARGET 11/05/20    Baseline 10/05/20:  initial standing exercises have been added and pt could benefit from additional balance exercises; pt verbally reports and has demo understanding of HEP    Status Achieved      PT LONG TERM GOAL #2   Title Pt will ambulate at least  1000 ft, indoor and outdoor surfaces, independently, no LOB for imrpoved community gait.    Baseline 1000 ft outdoors with cane and no LOB 10/26/2020    Status Achieved      PT LONG TERM GOAL #3   Title With transition to cane from RW, pt/husband will verbalize understanding of fall prevention in home  and community setting.    Baseline 11/02/20: met to date    Status Achieved      PT LONG TERM GOAL #4   Title Pt will improve DGI score to at least 18/24 for decreased fall risk.    Baseline 11/02/20: 19/24 scored today    Status Achieved                   Plan - 11/02/20 1025     Clinical Impression Statement Today's skilled session focused on progress toward remaining LTGs with both goals met. Pt has transitioned to use of cane with gait and scored 19/24 on the DGI. Remainder of session addressed balance training with no issues noted or reported. Pt in agreement to discharge today.    Personal Factors and Comorbidities Comorbidity 3+    Comorbidities PMH:  chronic lumbar radiculopathy, RA, GERD, obesity, lymphadenopathy, 07/22/20:MRI showed mild progression of L recess/foraminal protrusion at L3-4    Examination-Activity Limitations Locomotion Level;Transfers;Stand    Examination-Participation Restrictions Occupation;Community Activity    Stability/Clinical Decision Making Evolving/Moderate complexity    Rehab Potential Good    PT Frequency 1x / week    PT Duration 4 weeks   per recert 3/57/0177   PT Treatment/Interventions ADLs/Self Care Home Management;Gait training;Electrical Stimulation;Cryotherapy;DME Instruction;Neuromuscular re-education;Balance training;Therapeutic exercise;Therapeutic activities;Functional mobility training;Moist Heat;Traction;Ultrasound;Patient/family education;Manual techniques;Passive range of motion    PT Next Visit Plan discharge per PT plan of care    PT Home Exercise Plan [email or web address] Code: [email or web address]    Consulted and Agree with Plan of Care  Patient;Family member/caregiver    Family Member Consulted husband             Patient will benefit from skilled therapeutic intervention in order to improve the following deficits and impairments:  Abnormal gait, Decreased balance, Decreased range of motion, Difficulty walking, Pain, Postural dysfunction, Decreased strength, Decreased mobility  Visit Diagnosis: Other abnormalities of gait and mobility  Unsteadiness on feet  Muscle weakness (generalized)  Abnormal posture     Problem List Patient Active Problem List   Diagnosis Date Noted   Impaired ambulation 07/21/2020   Lumbosacral radiculopathy    Weakness of both lower extremities    IDA (iron deficiency anemia) 05/24/2020   Symptomatic cholelithiasis 01/31/2011   ALLERGIC RHINITIS 07/02/2008   HEADACHE, CHRONIC 07/02/2008   DYSPNEA 07/02/2008   COUGH 07/02/2008   OBESITY 07/01/2008   SICKLE CELL TRAIT 07/01/2008   OBSESSIVE-COMPULSIVE DISORDER 07/01/2008   DEPRESSION 07/01/2008    Willow Ora, PTA, [facility] Outpatient Neuro [facility] [address] [place], [place] [address] [phone] 11/02/20, 2:40 PM   Name: Clarine Elrod MRN: [id number] Date of Birth: 1965-02-15

## 2020-11-11 ENCOUNTER — Encounter: Payer: Self-pay | Admitting: Physical Therapy

## 2020-11-11 NOTE — Therapy (Signed)
[facility] [address], [place], [place], [address] Phone: [phone]   Fax:  [phone]  Patient Details  Name: Miranda Padilla MRN: [id number] Date of Birth: 02/06/1965 Referring Provider:  No ref. provider found  Encounter Date: 11/11/2020   PHYSICAL THERAPY DISCHARGE SUMMARY  Visits from Start of Care: 22  Current functional level related to goals / functional outcomes:  PT Long Term Goals - 11/02/20 1431       PT LONG TERM GOAL #1   Title Pt will be independent with final progression of HEP for improved flexibility,strength, pain, transfers, and gait.  TARGET 11/05/20    Baseline 10/05/20:  initial standing exercises have been added and pt could benefit from additional balance exercises; pt verbally reports and has demo understanding of HEP    Status Achieved      PT LONG TERM GOAL #2   Title Pt will ambulate at least 1000 ft, indoor and outdoor surfaces, independently, no LOB for imrpoved community gait.    Baseline 1000 ft outdoors with cane and no LOB 10/26/2020    Status Achieved      PT LONG TERM GOAL #3   Title With transition to cane from RW, pt/husband will verbalize understanding of fall prevention in home and community setting.    Baseline 11/02/20: met to date    Status Achieved      PT LONG TERM GOAL #4   Title Pt will improve DGI score to at least 18/24 for decreased fall risk.    Baseline 11/02/20: 19/24 scored today    Status Achieved                Remaining deficits: Fatigue, pain, balance deficits; all improving   Education / Equipment: Educated in progression of HEP, posture and positioning with work/ADLs, fall prevention and transition from walker to cane.   Patient agrees to discharge. Patient goals were met. Patient is being discharged due to meeting the stated rehab goals.   Airabella Barley W. 11/11/2020, 11:13 AM Frazier Butt., PT   [facility]  [facility] [address], [place], [place], [address] Phone: [phone]   Fax:  [phone]

## 2021-01-07 ENCOUNTER — Inpatient Hospital Stay (HOSPITAL_BASED_OUTPATIENT_CLINIC_OR_DEPARTMENT_OTHER): Payer: 59 | Admitting: Family

## 2021-01-07 ENCOUNTER — Other Ambulatory Visit: Payer: Self-pay

## 2021-01-07 ENCOUNTER — Inpatient Hospital Stay: Payer: 59 | Attending: Hematology & Oncology

## 2021-01-07 ENCOUNTER — Encounter: Payer: Self-pay | Admitting: Family

## 2021-01-07 VITALS — BP 119/78 | HR 94 | Temp 98.6°F | Resp 20 | Wt 307.1 lb

## 2021-01-07 DIAGNOSIS — D563 Thalassemia minor: Secondary | ICD-10-CM | POA: Diagnosis not present

## 2021-01-07 DIAGNOSIS — D573 Sickle-cell trait: Secondary | ICD-10-CM

## 2021-01-07 DIAGNOSIS — R590 Localized enlarged lymph nodes: Secondary | ICD-10-CM | POA: Insufficient documentation

## 2021-01-07 DIAGNOSIS — Z79899 Other long term (current) drug therapy: Secondary | ICD-10-CM | POA: Insufficient documentation

## 2021-01-07 DIAGNOSIS — D509 Iron deficiency anemia, unspecified: Secondary | ICD-10-CM | POA: Insufficient documentation

## 2021-01-07 LAB — IRON AND TIBC
Iron: 47 ug/dL (ref 41–142)
Saturation Ratios: 16 % — ABNORMAL LOW (ref 21–57)
TIBC: 305 ug/dL (ref 236–444)
UIBC: 258 ug/dL (ref 120–384)

## 2021-01-07 LAB — CBC WITH DIFFERENTIAL (CANCER CENTER ONLY)
Abs Immature Granulocytes: 0.03 10*3/uL (ref 0.00–0.07)
Basophils Absolute: 0 10*3/uL (ref 0.0–0.1)
Basophils Relative: 1 %
Eosinophils Absolute: 0.1 10*3/uL (ref 0.0–0.5)
Eosinophils Relative: 3 %
HCT: 36.3 % (ref 36.0–46.0)
Hemoglobin: 11.7 g/dL — ABNORMAL LOW (ref 12.0–15.0)
Immature Granulocytes: 1 %
Lymphocytes Relative: 27 %
Lymphs Abs: 1 10*3/uL (ref 0.7–4.0)
MCH: 26.8 pg (ref 26.0–34.0)
MCHC: 32.2 g/dL (ref 30.0–36.0)
MCV: 83.1 fL (ref 80.0–100.0)
Monocytes Absolute: 0.4 10*3/uL (ref 0.1–1.0)
Monocytes Relative: 9 %
Neutro Abs: 2.3 10*3/uL (ref 1.7–7.7)
Neutrophils Relative %: 59 %
Platelet Count: 294 10*3/uL (ref 150–400)
RBC: 4.37 MIL/uL (ref 3.87–5.11)
RDW: 14.9 % (ref 11.5–15.5)
WBC Count: 3.9 10*3/uL — ABNORMAL LOW (ref 4.0–10.5)
nRBC: 0 % (ref 0.0–0.2)

## 2021-01-07 LAB — FERRITIN: Ferritin: 233 ng/mL (ref 11–307)

## 2021-01-07 LAB — RETICULOCYTES
Immature Retic Fract: 4.6 % (ref 2.3–15.9)
RBC.: 4.44 MIL/uL (ref 3.87–5.11)
Retic Count, Absolute: 52.8 10*3/uL (ref 19.0–186.0)
Retic Ct Pct: 1.2 % (ref 0.4–3.1)

## 2021-01-07 NOTE — Progress Notes (Signed)
Hematology and Oncology Follow Up Visit  Miranda Padilla [id number] 1965-10-29 55 y.o. 01/07/2021   Principle Diagnosis:  Iron deficiency anemia  Sickle cell trait Alpha thalassemia trait Enlarged distal retroperitoneal and pelvic lymph nodes   Current Therapy:        IV iron as indicated  Folic acid 1 mg PO daily   Interim History:  [name] is here today with her husband for follow-up. She is doing well but has some fatigue at times.  No bleeding, bruising or petechiae.  No fever, chills, n/v, cough, rash, dizziness, SOB, chest pain, palpitations, abdominal pain or changes in bowel or bladder habits.  The chronic pain in her back with RA and intermittent muscle spasms are unchanged.  No falls or syncope to report.  She has a good appetite and is eating healthier. She is staying well hydrated. Her weight is stable at 307 lbs.   ECOG Performance Status: 1 - Symptomatic but completely ambulatory  Medications:  Allergies as of 01/07/2021       Reactions   Other Anaphylaxis, Other (See Comments)   Covid 19 vaccine (brand?)   Sulfonamide Derivatives Itching, Rash   Sulfa Antibiotics Itching, Rash        Medication List        Accurate as of January 07, 2021 10:42 AM. If you have any questions, ask your nurse or doctor.          acetaminophen 500 MG tablet Commonly known as: TYLENOL Take 1,000 mg by mouth every 6 (six) hours as needed for mild pain or moderate pain.   Baclofen 5 MG Tabs Take 5 mg by mouth 3 (three) times daily as needed (for muscle spasms).   capsaicin 0.025 % cream Commonly known as: ZOSTRIX Apply topically 2 (two) times daily.   DULoxetine 20 MG capsule Commonly known as: CYMBALTA Take 1 capsule (20 mg total) by mouth daily.   folic acid 1 MG tablet Commonly known as: FOLVITE Take 1 mg by mouth daily.   gabapentin 300 MG capsule Commonly known as: NEURONTIN Take 1 capsule (300 mg total) by mouth 3 (three) times  daily.   lidocaine 4 % cream Commonly known as: LMX Apply topically 2 (two) times daily.   predniSONE 1 MG tablet Commonly known as: DELTASONE Take 3 mg by mouth daily with breakfast.   zonisamide 100 MG capsule Commonly known as: ZONEGRAN Take 200 mg by mouth daily.        Allergies:  Allergies  Allergen Reactions   Other Anaphylaxis and Other (See Comments)    Covid 19 vaccine (brand?)   Sulfonamide Derivatives Itching and Rash   Sulfa Antibiotics Itching and Rash    Past Medical History, Surgical history, Social history, and Family History were reviewed and updated.  Review of Systems: All other 10 point review of systems is negative.   Physical Exam:  weight is 307 lb 1.9 oz (139.3 kg) (abnormal). Her oral temperature is 98.6 F (37 C). Her blood pressure is 119/78 and her pulse is 94. Her respiration is 20 and oxygen saturation is 100%.   Wt Readings from Last 3 Encounters:  01/07/21 (!) 307 lb 1.9 oz (139.3 kg)  07/21/20 (!) 307 lb 11.2 oz (139.6 kg)  07/14/20 (!) 306 lb (138.8 kg)    Ocular: Sclerae unicteric, pupils equal, round and reactive to light Ear-nose-throat: Oropharynx clear, dentition fair Lymphatic: No cervical or supraclavicular adenopathy Lungs no rales or rhonchi, good excursion bilaterally Heart regular rate and rhythm,  no murmur appreciated Abd soft, nontender, positive bowel sounds MSK no focal spinal tenderness, no joint edema Neuro: non-focal, well-oriented, appropriate affect Breasts: Deferred   Lab Results  Component Value Date   WBC 3.9 (L) 01/07/2021   HGB 11.7 (L) 01/07/2021   HCT 36.3 01/07/2021   MCV 83.1 01/07/2021   PLT 294 01/07/2021   Lab Results  Component Value Date   FERRITIN 355 (H) 09/08/2020   IRON 56 09/08/2020   TIBC 300 09/08/2020   UIBC 244 09/08/2020   IRONPCTSAT 19 (L) 09/08/2020   Lab Results  Component Value Date   RETICCTPCT 1.2 01/07/2021   RBC 4.44 01/07/2021   No results found for:  [facility] Lab Results  Component Value Date   IGGSERUM 1,722 (H) 07/22/2020   IGA 490 (H) 07/22/2020   IGMSERUM 84 07/22/2020   Lab Results  Component Value Date   TOTALPROTELP 6.5 07/22/2020   TOTALPROTELP 6.8 07/22/2020   ALBUMINELP 3.1 07/22/2020   A1GS 0.2 07/22/2020   A2GS 0.6 07/22/2020   BETS 1.1 07/22/2020   GAMS 1.6 07/22/2020   MSPIKE Not Observed 07/22/2020   SPEI Comment 07/22/2020     Chemistry      Component Value Date/Time   NA 138 07/22/2020 0758   K 3.6 07/22/2020 0758   CL 107 07/22/2020 0758   CO2 24 07/22/2020 0758   BUN 11 07/22/2020 0758   CREATININE 0.74 07/22/2020 0758   CREATININE 0.76 07/14/2020 1208      Component Value Date/Time   CALCIUM 8.6 (L) 07/22/2020 0758   ALKPHOS 61 07/14/2020 1208   AST 17 07/14/2020 1208   ALT 18 07/14/2020 1208   BILITOT 0.7 07/14/2020 1208       Impression and Plan: [name] is a very pleasant 55 yo African American female with iron deficiency anemia and sickle cell trait.  Iron studies are pending.  Follow-up in 4 months.   Miranda Dawson, NP 12/16/202210:42 AM

## 2021-01-10 ENCOUNTER — Telehealth: Payer: Self-pay | Admitting: *Deleted

## 2021-01-10 NOTE — Telephone Encounter (Signed)
Per12/19/22 los - called and lvm of upcoming appointments - requested call back to confirm

## 2021-01-12 ENCOUNTER — Telehealth: Payer: Self-pay | Admitting: Family

## 2021-01-12 NOTE — Telephone Encounter (Signed)
Called patient per 12/19 sch msg - no answer. Left message for patient to call back to schedule appt .

## 2021-01-14 ENCOUNTER — Other Ambulatory Visit: Payer: Self-pay

## 2021-01-14 ENCOUNTER — Inpatient Hospital Stay: Payer: 59

## 2021-01-14 VITALS — BP 130/76 | HR 93 | Temp 98.0°F | Resp 18

## 2021-01-14 DIAGNOSIS — D509 Iron deficiency anemia, unspecified: Secondary | ICD-10-CM

## 2021-01-14 MED ORDER — SODIUM CHLORIDE 0.9 % IV SOLN: Freq: Once | INTRAVENOUS | Status: AC

## 2021-01-14 MED ORDER — SODIUM CHLORIDE 0.9 % IV SOLN
125.0000 mg | Freq: Once | INTRAVENOUS | Status: AC
  Administered 2021-01-14: 10:00:00 125 mg via INTRAVENOUS
  Filled 2021-01-14: qty 125

## 2021-01-14 NOTE — Patient Instructions (Signed)
Sodium Ferric Gluconate Complex Injection ?What is this medication? ?SODIUM FERRIC GLUCONATE COMPLEX (SOE dee um FER ik GLOO koe nate KOM pleks) treats low levels of iron (iron deficiency anemia) in people with kidney disease. Iron is a mineral that plays an important role in making red blood cells, which carry oxygen from your lungs to the rest of your body. ?This medicine may be used for other purposes; ask your health care provider or pharmacist if you have questions. ?COMMON BRAND NAME(S): Ferrlecit, Nulecit ?What should I tell my care team before I take this medication? ?They need to know if you have any of the following conditions: ?Anemia that is not from iron deficiency ?High levels of iron in the blood ?An unusual or allergic reaction to iron, other medications, foods, dyes, or preservatives ?Pregnant or are trying to become pregnant ?Breast-feeding ?How should I use this medication? ?This medication is injected into a vein. It is given by your care team in a hospital or clinic setting. ?Talk to your care team about the use of this medication in children. While it may be prescribed for children as young as 6 years for selected conditions, precautions do apply. ?Overdosage: If you think you have taken too much of this medicine contact a poison control center or emergency room at once. ?NOTE: This medicine is only for you. Do not share this medicine with others. ?What if I miss a dose? ?It is important not to miss your dose. Call your care team if you are unable to keep an appointment. ?What may interact with this medication? ?Do not take this medication with any of the following: ?Deferasirox ?Deferoxamine ?Dimercaprol ?This medication may also interact with the following: ?Other iron products ?This list may not describe all possible interactions. Give your health care provider a list of all the medicines, herbs, non-prescription drugs, or dietary supplements you use. Also tell them if you smoke, drink  alcohol, or use illegal drugs. Some items may interact with your medicine. ?What should I watch for while using this medication? ?Your condition will be monitored carefully while you are receiving this medication. ?Visit your care team for regular checks on your progress. You may need blood work while you are taking this medication. ?What side effects may I notice from receiving this medication? ?Side effects that you should report to your care team as soon as possible: ?Allergic reactions--skin rash, itching, hives, swelling of the face, lips, tongue, or throat ?Low blood pressure--dizziness, feeling faint or lightheaded, blurry vision ?Shortness of breath ?Side effects that usually do not require medical attention (report to your care team if they continue or are bothersome): ?Flushing ?Headache ?Joint pain ?Muscle pain ?Nausea ?Pain, redness, or irritation at injection site ?This list may not describe all possible side effects. Call your doctor for medical advice about side effects. You may report side effects to FDA at 1-800-FDA-1088. ?Where should I keep my medication? ?This medication is given in a hospital or clinic and will not be stored at home. ?NOTE: This sheet is a summary. It may not cover all possible information. If you have questions about this medicine, talk to your doctor, pharmacist, or health care provider. ?? 2022 Elsevier/Gold Standard (2020-06-04 00:00:00) ? ?

## 2021-01-21 ENCOUNTER — Inpatient Hospital Stay: Payer: 59

## 2021-01-21 ENCOUNTER — Other Ambulatory Visit: Payer: Self-pay

## 2021-01-21 VITALS — BP 115/87 | HR 80 | Temp 98.4°F | Resp 18

## 2021-01-21 DIAGNOSIS — D509 Iron deficiency anemia, unspecified: Secondary | ICD-10-CM | POA: Diagnosis not present

## 2021-01-21 MED ORDER — SODIUM CHLORIDE 0.9 % IV SOLN
Freq: Once | INTRAVENOUS | Status: AC
Start: 2021-01-21 — End: 2021-01-21

## 2021-01-21 MED ORDER — SODIUM CHLORIDE 0.9 % IV SOLN
125.0000 mg | Freq: Once | INTRAVENOUS | Status: AC
  Administered 2021-01-21: 14:00:00 125 mg via INTRAVENOUS
  Filled 2021-01-21: qty 125

## 2021-01-21 NOTE — Progress Notes (Signed)
Patient refused to wait 30 minutes post infusion. Released stable and ASX. 

## 2021-01-21 NOTE — Patient Instructions (Signed)
Ferric Carboxymaltose Injection °What is this medication? °FERRIC CARBOXYMALTOSE (FER ik kar BOX ee MAWL tose) treats low levels of iron in your body (iron deficiency anemia). Iron is a mineral that plays an important role in making red blood cells, which carry oxygen from your lungs to the rest of your body. °This medicine may be used for other purposes; ask your health care provider or pharmacist if you have questions. °COMMON BRAND NAME(S): Injectafer °What should I tell my care team before I take this medication? °They need to know if you have any of these conditions: °High blood pressure °High levels of iron in the blood °An unusual or allergic reaction to iron, other medications, foods, dyes, or preservatives °Pregnant or trying to get pregnant °Breast-feeding °How should I use this medication? °This medication is injected into a vein. It is given by your care team in a hospital or clinic setting. °Talk to your care team about the use of this medication in children. While it may be given to children as young as 1 year for selected conditions, precautions do apply. °Overdosage: If you think you have taken too much of this medicine contact a poison control center or emergency room at once. °NOTE: This medicine is only for you. Do not share this medicine with others. °What if I miss a dose? °Keep appointments for follow-up doses. It is important not to miss your dose. Call your care team if you are unable to keep an appointment. °What may interact with this medication? °Do not take this medication with any of the following medications: °Deferoxamine °Dimercaprol °Other iron products °This list may not describe all possible interactions. Give your health care provider a list of all the medicines, herbs, non-prescription drugs, or dietary supplements you use. Also tell them if you smoke, drink alcohol, or use illegal drugs. Some items may interact with your medicine. °What should I watch for while using this  medication? °Visit your care team for regular checks on your progress. Tell your care team if your symptoms do not start to get better or if they get worse. °You may need blood work while you are taking this medication. °You may need to eat more foods that contain iron. Talk to your care team. Foods that contain iron include whole grains/cereals, dried fruits, beans, or peas, leafy green vegetables, and organ meats (liver, kidney). °What side effects may I notice from receiving this medication? °Side effects that you should report to your care team as soon as possible: °Allergic reactions--skin rash, itching, hives, swelling of the face, lips, tongue, or throat °Increase in blood pressure °Low blood pressure--dizziness, feeling faint or lightheaded, blurry vision °Shortness of breath °Side effects that usually do not require medical attention (report to your care team if they continue or are bothersome): °Flushing °Headache °Joint pain °Muscle pain °Nausea °Pain, redness, or irritation at injection site °This list may not describe all possible side effects. Call your doctor for medical advice about side effects. You may report side effects to FDA at 1-800-FDA-1088. °Where should I keep my medication? °This medication is given in a hospital or clinic. It will not be stored at home. °NOTE: This sheet is a summary. It may not cover all possible information. If you have questions about this medicine, talk to your doctor, pharmacist, or health care provider. °© 2022 Elsevier/Gold Standard (2020-06-04 00:00:00) ° °

## 2021-05-09 ENCOUNTER — Inpatient Hospital Stay (HOSPITAL_BASED_OUTPATIENT_CLINIC_OR_DEPARTMENT_OTHER): Payer: 59 | Admitting: Family

## 2021-05-09 ENCOUNTER — Encounter: Payer: Self-pay | Admitting: Family

## 2021-05-09 ENCOUNTER — Inpatient Hospital Stay: Payer: 59 | Attending: Hematology & Oncology

## 2021-05-09 VITALS — BP 106/79 | HR 86 | Temp 98.1°F | Resp 17 | Wt 326.4 lb

## 2021-05-09 DIAGNOSIS — D563 Thalassemia minor: Secondary | ICD-10-CM

## 2021-05-09 DIAGNOSIS — D573 Sickle-cell trait: Secondary | ICD-10-CM | POA: Diagnosis not present

## 2021-05-09 DIAGNOSIS — D509 Iron deficiency anemia, unspecified: Secondary | ICD-10-CM | POA: Diagnosis present

## 2021-05-09 LAB — IRON AND IRON BINDING CAPACITY (CC-WL,HP ONLY)
Iron: 82 ug/dL (ref 28–170)
Saturation Ratios: 26 % (ref 10.4–31.8)
TIBC: 318 ug/dL (ref 250–450)
UIBC: 236 ug/dL (ref 148–442)

## 2021-05-09 LAB — CBC WITH DIFFERENTIAL (CANCER CENTER ONLY)
Abs Immature Granulocytes: 0.04 10*3/uL (ref 0.00–0.07)
Basophils Absolute: 0 10*3/uL (ref 0.0–0.1)
Basophils Relative: 0 %
Eosinophils Absolute: 0.1 10*3/uL (ref 0.0–0.5)
Eosinophils Relative: 4 %
HCT: 36.1 % (ref 36.0–46.0)
Hemoglobin: 11.4 g/dL — ABNORMAL LOW (ref 12.0–15.0)
Immature Granulocytes: 1 %
Lymphocytes Relative: 32 %
Lymphs Abs: 1.2 10*3/uL (ref 0.7–4.0)
MCH: 26.4 pg (ref 26.0–34.0)
MCHC: 31.6 g/dL (ref 30.0–36.0)
MCV: 83.6 fL (ref 80.0–100.0)
Monocytes Absolute: 0.5 10*3/uL (ref 0.1–1.0)
Monocytes Relative: 12 %
Neutro Abs: 1.9 10*3/uL (ref 1.7–7.7)
Neutrophils Relative %: 51 %
Platelet Count: 280 10*3/uL (ref 150–400)
RBC: 4.32 MIL/uL (ref 3.87–5.11)
RDW: 15.1 % (ref 11.5–15.5)
WBC Count: 3.7 10*3/uL — ABNORMAL LOW (ref 4.0–10.5)
nRBC: 0 % (ref 0.0–0.2)

## 2021-05-09 LAB — RETICULOCYTES
Immature Retic Fract: 17 % — ABNORMAL HIGH (ref 2.3–15.9)
RBC.: 4.24 MIL/uL (ref 3.87–5.11)
Retic Count, Absolute: 70.4 10*3/uL (ref 19.0–186.0)
Retic Ct Pct: 1.7 % (ref 0.4–3.1)

## 2021-05-09 LAB — FERRITIN: Ferritin: 227 ng/mL (ref 11–307)

## 2021-05-09 NOTE — Progress Notes (Signed)
?Hematology and Oncology Follow Up Visit ? ?Miranda Padilla ?[id number] ?06-Aug-1965 56 y.o. ?05/09/2021 ? ? ?Principle Diagnosis:  ?Iron deficiency anemia  ?Sickle cell trait ?Alpha thalassemia trait ?Enlarged distal retroperitoneal and pelvic lymph nodes ?  ?Current Therapy:        ?IV iron as indicated  ?Folic acid 1 mg PO daily ?  ?Interim History:  Miranda Padilla is here today for follow-up. Miranda Padilla is doing fairly well but states that Miranda Padilla is having some lower back and hip pain as well as increased neuropathic pain in her feet. Miranda Padilla has not followed up with her neurologist yet but will if her symptoms persist.  ?No syncope. Miranda Padilla did slide off a low chair onto the floor but states that this was not a fall and Miranda Padilla was not injured.  ?No fever, chills, n/v, cough, rash, dizziness, SOB, chest pain, palpitations, abdominal pain or changes in bowel habits.  ?Miranda Padilla states that with the back pain Miranda Padilla has had some urinary urgency.  ?No blood loss noted. No bruising or petechiae.  ?Miranda Padilla has maintained a good appetite and is staying well hydrated throughout the day. Her weight is 326 lbs.  ? ?ECOG Performance Status: 1 - Symptomatic but completely ambulatory ? ?Medications:  ?Allergies as of 05/09/2021   ? ?   Reactions  ? Other Anaphylaxis, Other (See Comments)  ? Covid 19 vaccine (brand?)  ? Sulfonamide Derivatives Itching, Rash  ? Sulfa Antibiotics Itching, Rash  ? ?  ? ?  ?Medication List  ?  ? ?  ? Accurate as of May 09, 2021 10:31 AM. If you have any questions, ask your nurse or doctor.  ?  ?  ? ?  ? ?acetaminophen 500 MG tablet ?Commonly known as: TYLENOL ?Take 1,000 mg by mouth every 6 (six) hours as needed for mild pain or moderate pain. ?  ?Baclofen 5 MG Tabs ?Take 5 mg by mouth 3 (three) times daily as needed (for muscle spasms). ?  ?capsaicin 0.025 % cream ?Commonly known as: ZOSTRIX ?Apply topically 2 (two) times daily. ?  ?DULoxetine 20 MG capsule ?Commonly known as: CYMBALTA ?Take 1 capsule (20 mg total) by  mouth daily. ?  ?folic acid 1 MG tablet ?Commonly known as: FOLVITE ?Take 1 mg by mouth daily. ?  ?gabapentin 300 MG capsule ?Commonly known as: NEURONTIN ?Take 1 capsule (300 mg total) by mouth 3 (three) times daily. ?  ?lidocaine 4 % cream ?Commonly known as: LMX ?Apply topically 2 (two) times daily. ?  ?predniSONE 1 MG tablet ?Commonly known as: DELTASONE ?Take 3 mg by mouth daily with breakfast. ?  ?zonisamide 100 MG capsule ?Commonly known as: ZONEGRAN ?Take 200 mg by mouth daily. ?  ?zonisamide 100 MG capsule ?Commonly known as: ZONEGRAN ?Take by mouth. ?  ? ?  ? ? ?Allergies:  ?Allergies  ?Allergen Reactions  ? Other Anaphylaxis and Other (See Comments)  ?  Covid 19 vaccine (brand?)  ? Sulfonamide Derivatives Itching and Rash  ? Sulfa Antibiotics Itching and Rash  ? ? ?Past Medical History, Surgical history, Social history, and Family History were reviewed and updated. ? ?Review of Systems: ?All other 10 point review of systems is negative.  ? ?Physical Exam: ? weight is 326 lb 6.4 oz (148.1 kg) (abnormal). Her oral temperature is 98.1 ?F (36.7 ?C). Her blood pressure is 106/79 and her pulse is 86. Her respiration is 17 and oxygen saturation is 100%.  ? ?Wt Readings from Last 3 Encounters:  ?05/09/21 (!) 326  lb 6.4 oz (148.1 kg)  ?01/07/21 (!) 307 lb 1.9 oz (139.3 kg)  ?07/21/20 (!) 307 lb 11.2 oz (139.6 kg)  ? ? ?Ocular: Sclerae unicteric, pupils equal, round and reactive to light ?Ear-nose-throat: Oropharynx clear, dentition fair ?Lymphatic: No cervical or supraclavicular adenopathy ?Lungs no rales or rhonchi, good excursion bilaterally ?Heart regular rate and rhythm, no murmur appreciated ?Abd soft, nontender, positive bowel sounds ?MSK no focal spinal tenderness, no joint edema ?Neuro: non-focal, well-oriented, appropriate affect ?Breasts: Deferred  ? ?Lab Results  ?Component Value Date  ? WBC 3.7 (L) 05/09/2021  ? HGB 11.4 (L) 05/09/2021  ? HCT 36.1 05/09/2021  ? MCV 83.6 05/09/2021  ? PLT 280 05/09/2021   ? ?Lab Results  ?Component Value Date  ? FERRITIN 233 01/07/2021  ? IRON 47 01/07/2021  ? TIBC 305 01/07/2021  ? UIBC 258 01/07/2021  ? IRONPCTSAT 16 (L) 01/07/2021  ? ?Lab Results  ?Component Value Date  ? RETICCTPCT 1.7 05/09/2021  ? RBC 4.24 05/09/2021  ? ?No results found for: KPAFRELGTCHN, LAMBDASER, KAPLAMBRATIO ?Lab Results  ?Component Value Date  ? IGGSERUM 1,722 (H) 07/22/2020  ? IGA 490 (H) 07/22/2020  ? IGMSERUM 84 07/22/2020  ? ?Lab Results  ?Component Value Date  ? TOTALPROTELP 6.5 07/22/2020  ? TOTALPROTELP 6.8 07/22/2020  ? ALBUMINELP 3.1 07/22/2020  ? A1GS 0.2 07/22/2020  ? A2GS 0.6 07/22/2020  ? BETS 1.1 07/22/2020  ? GAMS 1.6 07/22/2020  ? MSPIKE Not Observed 07/22/2020  ? SPEI Comment 07/22/2020  ? ?  Chemistry   ?   ?Component Value Date/Time  ? NA 138 07/22/2020 0758  ? K 3.6 07/22/2020 0758  ? CL 107 07/22/2020 0758  ? CO2 24 07/22/2020 0758  ? BUN 11 07/22/2020 0758  ? CREATININE 0.74 07/22/2020 0758  ? CREATININE 0.76 07/14/2020 1208  ?    ?Component Value Date/Time  ? CALCIUM 8.6 (L) 07/22/2020 0758  ? ALKPHOS 61 07/14/2020 1208  ? AST 17 07/14/2020 1208  ? ALT 18 07/14/2020 1208  ? BILITOT 0.7 07/14/2020 1208  ?  ? ? ? ?Impression and Plan: Miranda Padilla is a very pleasant 56 yo African American female with iron deficiency anemia and sickle cell trait.  ?Iron studies are pending.  ?Follow-up in 4 months.   ? ?Lottie Dawson, NP ?4/17/202310:31 AM ? ?

## 2021-09-06 ENCOUNTER — Inpatient Hospital Stay: Payer: 59 | Admitting: Family

## 2021-09-06 ENCOUNTER — Encounter: Payer: Self-pay | Admitting: Family

## 2021-09-06 ENCOUNTER — Inpatient Hospital Stay: Payer: 59 | Attending: Hematology & Oncology

## 2021-09-06 VITALS — BP 133/77 | HR 80 | Temp 98.0°F | Resp 17 | Wt 313.0 lb

## 2021-09-06 DIAGNOSIS — D563 Thalassemia minor: Secondary | ICD-10-CM | POA: Diagnosis not present

## 2021-09-06 DIAGNOSIS — D573 Sickle-cell trait: Secondary | ICD-10-CM | POA: Diagnosis not present

## 2021-09-06 DIAGNOSIS — D509 Iron deficiency anemia, unspecified: Secondary | ICD-10-CM | POA: Insufficient documentation

## 2021-09-06 DIAGNOSIS — R5383 Other fatigue: Secondary | ICD-10-CM | POA: Insufficient documentation

## 2021-09-06 LAB — IRON AND IRON BINDING CAPACITY (CC-WL,HP ONLY)
Iron: 78 ug/dL (ref 28–170)
Saturation Ratios: 24 % (ref 10.4–31.8)
TIBC: 319 ug/dL (ref 250–450)
UIBC: 241 ug/dL (ref 148–442)

## 2021-09-06 LAB — CBC WITH DIFFERENTIAL (CANCER CENTER ONLY)
Abs Immature Granulocytes: 0.02 10*3/uL (ref 0.00–0.07)
Basophils Absolute: 0 10*3/uL (ref 0.0–0.1)
Basophils Relative: 0 %
Eosinophils Absolute: 0.1 10*3/uL (ref 0.0–0.5)
Eosinophils Relative: 3 %
HCT: 36.5 % (ref 36.0–46.0)
Hemoglobin: 11.9 g/dL — ABNORMAL LOW (ref 12.0–15.0)
Immature Granulocytes: 1 %
Lymphocytes Relative: 27 %
Lymphs Abs: 1 10*3/uL (ref 0.7–4.0)
MCH: 26.6 pg (ref 26.0–34.0)
MCHC: 32.6 g/dL (ref 30.0–36.0)
MCV: 81.7 fL (ref 80.0–100.0)
Monocytes Absolute: 0.3 10*3/uL (ref 0.1–1.0)
Monocytes Relative: 8 %
Neutro Abs: 2.2 10*3/uL (ref 1.7–7.7)
Neutrophils Relative %: 61 %
Platelet Count: 308 10*3/uL (ref 150–400)
RBC: 4.47 MIL/uL (ref 3.87–5.11)
RDW: 14.8 % (ref 11.5–15.5)
WBC Count: 3.6 10*3/uL — ABNORMAL LOW (ref 4.0–10.5)
nRBC: 0 % (ref 0.0–0.2)

## 2021-09-06 LAB — RETICULOCYTES
Immature Retic Fract: 9.8 % (ref 2.3–15.9)
RBC.: 4.46 MIL/uL (ref 3.87–5.11)
Retic Count, Absolute: 71.4 10*3/uL (ref 19.0–186.0)
Retic Ct Pct: 1.6 % (ref 0.4–3.1)

## 2021-09-06 LAB — FERRITIN: Ferritin: 82 ng/mL (ref 11–307)

## 2021-09-06 NOTE — Progress Notes (Signed)
Hematology and Oncology Follow Up Visit  Miranda Padilla [id number] 1965/11/15 56 y.o. 09/06/2021   Principle Diagnosis:  Iron deficiency anemia  Sickle cell trait Alpha thalassemia trait Enlarged distal retroperitoneal and pelvic lymph nodes   Current Therapy:        IV iron as indicated  Folic acid 1 mg PO daily   Interim History:  [name] is here today with her husband for follow-up. She is doing well but notes some fatigue.  No blood loss, bruising or petechiae.  No fever, chills, n/v, cough, rash, dizziness, SOB, chest pain, palpitations, abdominal pain or changes in bowel or bladder habits.  Chronic swelling in her lower extremities and neuropathy in her feet unchanged from baseline. She wears compression stockings daily to help reduce fluid retention.  No falls or syncope to report.  She has been eating a healthier diet and staying well hydrated. [facility].  Weight is 313 lbs.   ECOG Performance Status: 1 - Symptomatic but completely ambulatory  Medications:  Allergies as of 09/06/2021       Reactions   Other Anaphylaxis, Other (See Comments)   Covid 19 vaccine (brand?)   Sulfonamide Derivatives Itching, Rash   Sulfa Antibiotics Itching, Rash        Medication List        Accurate as of September 06, 2021 10:09 AM. If you have any questions, ask your nurse or doctor.          acetaminophen 500 MG tablet Commonly known as: TYLENOL Take 1,000 mg by mouth every 6 (six) hours as needed for mild pain or moderate pain.   Baclofen 5 MG Tabs Take 5 mg by mouth 3 (three) times daily as needed (for muscle spasms).   capsaicin 0.025 % cream Commonly known as: ZOSTRIX Apply topically 2 (two) times daily.   DULoxetine 20 MG capsule Commonly known as: CYMBALTA Take 1 capsule (20 mg total) by mouth daily.   folic acid 1 MG tablet Commonly known as: FOLVITE Take 1 mg by mouth  daily.   gabapentin 300 MG capsule Commonly known as: NEURONTIN Take 1 capsule (300 mg total) by mouth 3 (three) times daily.   lidocaine 4 % cream Commonly known as: LMX Apply topically 2 (two) times daily.   predniSONE 1 MG tablet Commonly known as: DELTASONE Take 3 mg by mouth daily with breakfast.   zonisamide 100 MG capsule Commonly known as: ZONEGRAN Take 200 mg by mouth daily.   zonisamide 100 MG capsule Commonly known as: ZONEGRAN Take by mouth.        Allergies:  Allergies  Allergen Reactions   Other Anaphylaxis and Other (See Comments)    Covid 19 vaccine (brand?)   Sulfonamide Derivatives Itching and Rash   Sulfa Antibiotics Itching and Rash    Past Medical History, Surgical history, Social history, and Family History were reviewed and updated.  Review of Systems: All other 10 point review of systems is negative.   Physical Exam:  weight is 313 lb (142 kg) (abnormal). Her oral temperature is 98 F (36.7 C). Her blood pressure is 133/77 and her pulse is 80. Her respiration is 17 and oxygen saturation is 100%.   Wt Readings from Last 3 Encounters:  09/06/21 (!) 313 lb (142 kg)  05/09/21 (!) 326 lb 6.4 oz (148.1 kg)  01/07/21 (!) 307 lb 1.9 oz (139.3 kg)    Ocular: Sclerae unicteric, pupils  equal, round and reactive to light Ear-nose-throat: Oropharynx clear, dentition fair Lymphatic: No cervical or supraclavicular adenopathy Lungs no rales or rhonchi, good excursion bilaterally Heart regular rate and rhythm, no murmur appreciated Abd soft, nontender, positive bowel sounds MSK no focal spinal tenderness, no joint edema Neuro: non-focal, well-oriented, appropriate affect Breasts: Deferred   Lab Results  Component Value Date   WBC 3.6 (L) 09/06/2021   HGB 11.9 (L) 09/06/2021   HCT 36.5 09/06/2021   MCV 81.7 09/06/2021   PLT 308 09/06/2021   Lab Results  Component Value Date   FERRITIN 227 05/09/2021   IRON 82 05/09/2021   TIBC 318  05/09/2021   UIBC 236 05/09/2021   IRONPCTSAT 26 05/09/2021   Lab Results  Component Value Date   RETICCTPCT 1.6 09/06/2021   RBC 4.46 09/06/2021   No results found for: "KPAFRELGTCHN", "LAMBDASER", "KAPLAMBRATIO" Lab Results  Component Value Date   IGGSERUM 1,722 (H) 07/22/2020   IGA 490 (H) 07/22/2020   IGMSERUM 84 07/22/2020   Lab Results  Component Value Date   TOTALPROTELP 6.5 07/22/2020   TOTALPROTELP 6.8 07/22/2020   ALBUMINELP 3.1 07/22/2020   A1GS 0.2 07/22/2020   A2GS 0.6 07/22/2020   BETS 1.1 07/22/2020   GAMS 1.6 07/22/2020   MSPIKE Not Observed 07/22/2020   SPEI Comment 07/22/2020     Chemistry      Component Value Date/Time   NA 138 07/22/2020 0758   K 3.6 07/22/2020 0758   CL 107 07/22/2020 0758   CO2 24 07/22/2020 0758   BUN 11 07/22/2020 0758   CREATININE 0.74 07/22/2020 0758   CREATININE 0.76 07/14/2020 1208      Component Value Date/Time   CALCIUM 8.6 (L) 07/22/2020 0758   ALKPHOS 61 07/14/2020 1208   AST 17 07/14/2020 1208   ALT 18 07/14/2020 1208   BILITOT 0.7 07/14/2020 1208       Impression and Plan: [name] is a very pleasant 56 yo African American female with iron deficiency anemia and sickle cell trait.  Iron studies are pending.  Follow-up in 6 months.   Lottie Dawson, NP 8/15/202310:09 AM

## 2021-09-07 ENCOUNTER — Telehealth: Payer: Self-pay | Admitting: *Deleted

## 2021-09-07 NOTE — Telephone Encounter (Signed)
Called and lvm of upcoming appointments - requested callback to confirm 

## 2022-03-08 ENCOUNTER — Encounter: Payer: Self-pay | Admitting: Family

## 2022-03-08 ENCOUNTER — Inpatient Hospital Stay: Payer: 59 | Attending: Hematology & Oncology

## 2022-03-08 ENCOUNTER — Inpatient Hospital Stay: Payer: 59 | Admitting: Family

## 2022-03-08 VITALS — BP 133/71 | HR 76 | Temp 98.8°F | Resp 17 | Wt 313.8 lb

## 2022-03-08 DIAGNOSIS — R590 Localized enlarged lymph nodes: Secondary | ICD-10-CM | POA: Insufficient documentation

## 2022-03-08 DIAGNOSIS — D509 Iron deficiency anemia, unspecified: Secondary | ICD-10-CM | POA: Insufficient documentation

## 2022-03-08 DIAGNOSIS — D573 Sickle-cell trait: Secondary | ICD-10-CM | POA: Insufficient documentation

## 2022-03-08 DIAGNOSIS — D563 Thalassemia minor: Secondary | ICD-10-CM

## 2022-03-08 LAB — CBC WITH DIFFERENTIAL (CANCER CENTER ONLY)
Abs Immature Granulocytes: 0.04 10*3/uL (ref 0.00–0.07)
Basophils Absolute: 0 10*3/uL (ref 0.0–0.1)
Basophils Relative: 1 %
Eosinophils Absolute: 0.2 10*3/uL (ref 0.0–0.5)
Eosinophils Relative: 4 %
HCT: 38.7 % (ref 36.0–46.0)
Hemoglobin: 12.1 g/dL (ref 12.0–15.0)
Immature Granulocytes: 1 %
Lymphocytes Relative: 35 %
Lymphs Abs: 1.3 10*3/uL (ref 0.7–4.0)
MCH: 25.4 pg — ABNORMAL LOW (ref 26.0–34.0)
MCHC: 31.3 g/dL (ref 30.0–36.0)
MCV: 81.3 fL (ref 80.0–100.0)
Monocytes Absolute: 0.4 10*3/uL (ref 0.1–1.0)
Monocytes Relative: 10 %
Neutro Abs: 1.8 10*3/uL (ref 1.7–7.7)
Neutrophils Relative %: 49 %
Platelet Count: 303 10*3/uL (ref 150–400)
RBC: 4.76 MIL/uL (ref 3.87–5.11)
RDW: 15.4 % (ref 11.5–15.5)
WBC Count: 3.8 10*3/uL — ABNORMAL LOW (ref 4.0–10.5)
nRBC: 0 % (ref 0.0–0.2)

## 2022-03-08 LAB — RETICULOCYTES
Immature Retic Fract: 6.9 % (ref 2.3–15.9)
RBC.: 4.69 MIL/uL (ref 3.87–5.11)
Retic Count, Absolute: 59.6 10*3/uL (ref 19.0–186.0)
Retic Ct Pct: 1.3 % (ref 0.4–3.1)

## 2022-03-08 LAB — FERRITIN: Ferritin: 49 ng/mL (ref 11–307)

## 2022-03-08 NOTE — Progress Notes (Signed)
Hematology and Oncology Follow Up Visit  Miranda Padilla [id number] [birth year] 57 y.o. 03/08/2022   Principle Diagnosis:  Iron deficiency anemia  Sickle cell trait Alpha thalassemia trait Enlarged distal retroperitoneal and pelvic lymph nodes   Current Therapy:        IV iron as indicated  Folic acid 1 mg PO daily   Interim History:  Miranda Padilla is here today for follow-up. She is doing fairly well. She had a fall 3 weeks ago after slipping in some mud. Thankfully she states that she was not seriously injured just a little sore after. No new falls. No syncope reported.  No blood loss noted. No bruising or petechiae.  She is taking her folic acid daily as prescribed and doing her best to eat am iron rich diet. Hydration has been good. Weight is stable at 313 lbs.  No fever, chills, n/v, cough, rash, dizziness, chest pain, palpitations, abdominal pain or changes in bowel or bladder habits.  No swelling in her extremities.  Neuropathy in her feet is unchanged from baseline. She is walking with her cane for added support.   ECOG Performance Status: 1 - Symptomatic but completely ambulatory  Medications:  Allergies as of 03/08/2022       Reactions   Other Anaphylaxis, Other (See Comments)   Covid 19 vaccine (brand?)   Sulfonamide Derivatives Itching, Rash   Sulfa Antibiotics Itching, Rash        Medication List        Accurate as of March 08, 2022  1:08 PM. If you have any questions, ask your nurse or doctor.          acetaminophen 500 MG tablet Commonly known as: TYLENOL Take 1,000 mg by mouth every 6 (six) hours as needed for mild pain or moderate pain.   Baclofen 5 MG Tabs Take 5 mg by mouth 3 (three) times daily as needed (for muscle spasms).   capsaicin 0.025 % cream Commonly known as: ZOSTRIX Apply topically 2 (two) times daily.   DULoxetine 20 MG capsule Commonly known as: CYMBALTA Take 1 capsule (20 mg total) by mouth daily.   folic acid  1 MG tablet Commonly known as: FOLVITE Take 1 mg by mouth daily.   gabapentin 300 MG capsule Commonly known as: NEURONTIN Take 1 capsule (300 mg total) by mouth 3 (three) times daily.   lidocaine 4 % cream Commonly known as: LMX Apply topically 2 (two) times daily.   Melatonin 5 MG Caps Take by mouth at bedtime as needed.   predniSONE 1 MG tablet Commonly known as: DELTASONE Take 3 mg by mouth daily with breakfast.   zonisamide 100 MG capsule Commonly known as: ZONEGRAN Take 200 mg by mouth daily.   zonisamide 100 MG capsule Commonly known as: ZONEGRAN Take by mouth.        Allergies:  Allergies  Allergen Reactions   Other Anaphylaxis and Other (See Comments)    Covid 19 vaccine (brand?)   Sulfonamide Derivatives Itching and Rash   Sulfa Antibiotics Itching and Rash    Past Medical History, Surgical history, Social history, and Family History were reviewed and updated.  Review of Systems: All other 10 point review of systems is negative.   Physical Exam:  vitals were not taken for this visit.   Wt Readings from Last 3 Encounters:  09/06/21 (!) 313 lb (142 kg)  05/09/21 (!) 326 lb 6.4 oz (148.1 kg)  01/07/21 (!) 307 lb 1.9 oz (139.3 kg)  Ocular: Sclerae unicteric, pupils equal, round and reactive to light Ear-nose-throat: Oropharynx clear, dentition fair Lymphatic: No cervical or supraclavicular adenopathy Lungs no rales or rhonchi, good excursion bilaterally Heart regular rate and rhythm, no murmur appreciated Abd soft, nontender, positive bowel sounds MSK no focal spinal tenderness, no joint edema Neuro: non-focal, well-oriented, appropriate affect Breasts: Deferred   Lab Results  Component Value Date   WBC 3.6 (L) 09/06/2021   HGB 11.9 (L) 09/06/2021   HCT 36.5 09/06/2021   MCV 81.7 09/06/2021   PLT 308 09/06/2021   Lab Results  Component Value Date   FERRITIN 82 09/06/2021   IRON 78 09/06/2021   TIBC 319 09/06/2021   UIBC 241  09/06/2021   IRONPCTSAT 24 09/06/2021   Lab Results  Component Value Date   RETICCTPCT 1.6 09/06/2021   RBC 4.46 09/06/2021   No results found for: "KPAFRELGTCHN", "LAMBDASER", "KAPLAMBRATIO" Lab Results  Component Value Date   IGGSERUM 1,722 (H) 07/22/2020   IGA 490 (H) 07/22/2020   IGMSERUM 84 07/22/2020   Lab Results  Component Value Date   TOTALPROTELP 6.5 07/22/2020   TOTALPROTELP 6.8 07/22/2020   ALBUMINELP 3.1 07/22/2020   A1GS 0.2 07/22/2020   A2GS 0.6 07/22/2020   BETS 1.1 07/22/2020   GAMS 1.6 07/22/2020   MSPIKE Not Observed 07/22/2020   SPEI Comment 07/22/2020     Chemistry      Component Value Date/Time   NA 138 07/22/2020 0758   K 3.6 07/22/2020 0758   CL 107 07/22/2020 0758   CO2 24 07/22/2020 0758   BUN 11 07/22/2020 0758   CREATININE 0.74 07/22/2020 0758   CREATININE 0.76 07/14/2020 1208      Component Value Date/Time   CALCIUM 8.6 (L) 07/22/2020 0758   ALKPHOS 61 07/14/2020 1208   AST 17 07/14/2020 1208   ALT 18 07/14/2020 1208   BILITOT 0.7 07/14/2020 1208       Impression and Plan: Miranda Padilla is a very pleasant 57 yo African American female with iron deficiency anemia and sickle cell trait.  Iron studies are pending.  Follow-up in 6 months.   Miranda Dawson, NP 2/14/20241:08 PM

## 2022-03-09 LAB — IRON AND IRON BINDING CAPACITY (CC-WL,HP ONLY)
Iron: 65 ug/dL (ref 28–170)
Saturation Ratios: 19 % (ref 10.4–31.8)
TIBC: 346 ug/dL (ref 250–450)
UIBC: 281 ug/dL (ref 148–442)

## 2022-03-10 ENCOUNTER — Ambulatory Visit: Payer: 59 | Admitting: Family

## 2022-03-10 ENCOUNTER — Other Ambulatory Visit: Payer: 59

## 2022-09-06 ENCOUNTER — Encounter: Payer: Self-pay | Admitting: Family

## 2022-09-06 ENCOUNTER — Inpatient Hospital Stay: Payer: 59 | Attending: Family

## 2022-09-06 ENCOUNTER — Other Ambulatory Visit: Payer: Self-pay

## 2022-09-06 ENCOUNTER — Inpatient Hospital Stay: Payer: 59 | Admitting: Family

## 2022-09-06 VITALS — BP 118/78 | HR 73 | Temp 98.7°F | Resp 19 | Ht 65.0 in | Wt 313.1 lb

## 2022-09-06 DIAGNOSIS — D509 Iron deficiency anemia, unspecified: Secondary | ICD-10-CM | POA: Diagnosis present

## 2022-09-06 DIAGNOSIS — D563 Thalassemia minor: Secondary | ICD-10-CM

## 2022-09-06 DIAGNOSIS — D573 Sickle-cell trait: Secondary | ICD-10-CM | POA: Insufficient documentation

## 2022-09-06 DIAGNOSIS — Z79899 Other long term (current) drug therapy: Secondary | ICD-10-CM | POA: Diagnosis not present

## 2022-09-06 LAB — FERRITIN: Ferritin: 53 ng/mL (ref 11–307)

## 2022-09-06 LAB — CBC WITH DIFFERENTIAL (CANCER CENTER ONLY)
Abs Immature Granulocytes: 0.07 10*3/uL (ref 0.00–0.07)
Basophils Absolute: 0 10*3/uL (ref 0.0–0.1)
Basophils Relative: 1 %
Eosinophils Absolute: 0.1 10*3/uL (ref 0.0–0.5)
Eosinophils Relative: 3 %
HCT: 36 % (ref 36.0–46.0)
Hemoglobin: 11.5 g/dL — ABNORMAL LOW (ref 12.0–15.0)
Immature Granulocytes: 2 %
Lymphocytes Relative: 30 %
Lymphs Abs: 1.3 10*3/uL (ref 0.7–4.0)
MCH: 26.1 pg (ref 26.0–34.0)
MCHC: 31.9 g/dL (ref 30.0–36.0)
MCV: 81.6 fL (ref 80.0–100.0)
Monocytes Absolute: 0.4 10*3/uL (ref 0.1–1.0)
Monocytes Relative: 10 %
Neutro Abs: 2.4 10*3/uL (ref 1.7–7.7)
Neutrophils Relative %: 54 %
Platelet Count: 285 10*3/uL (ref 150–400)
RBC: 4.41 MIL/uL (ref 3.87–5.11)
RDW: 15.4 % (ref 11.5–15.5)
WBC Count: 4.3 10*3/uL (ref 4.0–10.5)
nRBC: 0 % (ref 0.0–0.2)

## 2022-09-06 LAB — IRON AND IRON BINDING CAPACITY (CC-WL,HP ONLY)
Iron: 49 ug/dL (ref 28–170)
Saturation Ratios: 15 % (ref 10.4–31.8)
TIBC: 325 ug/dL (ref 250–450)
UIBC: 276 ug/dL (ref 148–442)

## 2022-09-06 LAB — RETICULOCYTES
Immature Retic Fract: 8.9 % (ref 2.3–15.9)
RBC.: 4.46 MIL/uL (ref 3.87–5.11)
Retic Count, Absolute: 42.4 10*3/uL (ref 19.0–186.0)
Retic Ct Pct: 1 % (ref 0.4–3.1)

## 2022-09-06 NOTE — Progress Notes (Signed)
Hematology and Oncology Follow Up Visit  Miranda Padilla [id number] 04-15-1965 57 y.o. 09/06/2022   Principle Diagnosis:  Iron deficiency anemia  Sickle cell trait Alpha thalassemia trait Enlarged distal retroperitoneal and pelvic lymph nodes   Current Therapy:        IV iron as indicated  Folic acid 1 mg PO daily   Interim History:  Miranda Padilla is here today for follow-up. She is doing well but notes fatigue. She states that she has been changing over to a healthier diet, drinking more water throughout the day and exercising 60-90 minutes a day.  Weight is stable at 313 lbs.  Overall she is feeling better.  No issue with blood loss. No bruising or petechiae.  No fever, chills, n/v, cough, rash, dizziness, SOB, chest pain, palpitations, abdominal pain or changes in bowel or bladder habits at this time.  Minimal swelling in her extremities after sitting for an extended period of time.  No falls or syncope.   ECOG Performance Status: 1 - Symptomatic but completely ambulatory  Medications:  Allergies as of 09/06/2022       Reactions   Other Anaphylaxis, Other (See Comments)   Covid 19 vaccine (brand?)   Sulfonamide Derivatives Itching, Rash   Sulfa Antibiotics Itching, Rash        Medication List        Accurate as of September 06, 2022  1:27 PM. If you have any questions, ask your nurse or doctor.          acetaminophen 500 MG tablet Commonly known as: TYLENOL Take 1,000 mg by mouth every 6 (six) hours as needed for mild pain or moderate pain.   Baclofen 5 MG Tabs Take 5 mg by mouth 3 (three) times daily as needed (for muscle spasms).   capsaicin 0.025 % cream Commonly known as: ZOSTRIX Apply topically 2 (two) times daily.   DULoxetine 20 MG capsule Commonly known as: CYMBALTA Take 1 capsule (20 mg total) by mouth daily.   folic acid 1 MG tablet Commonly known as: FOLVITE Take 1 mg by mouth daily.   gabapentin 300 MG capsule Commonly known as:  NEURONTIN Take 1 capsule (300 mg total) by mouth 3 (three) times daily.   lidocaine 4 % cream Commonly known as: LMX Apply topically 2 (two) times daily.   Melatonin 5 MG Caps Take by mouth at bedtime as needed.   Orencia 250 MG injection Generic drug: abatacept Inject into the vein every 30 (thirty) days.   predniSONE 1 MG tablet Commonly known as: DELTASONE Take 3 mg by mouth daily with breakfast.   zonisamide 100 MG capsule Commonly known as: ZONEGRAN Take 200 mg by mouth daily.   zonisamide 100 MG capsule Commonly known as: ZONEGRAN Take by mouth.        Allergies:  Allergies  Allergen Reactions   Other Anaphylaxis and Other (See Comments)    Covid 19 vaccine (brand?)   Sulfonamide Derivatives Itching and Rash   Sulfa Antibiotics Itching and Rash    Past Medical History, Surgical history, Social history, and Family History were reviewed and updated.  Review of Systems: All other 10 point review of systems is negative.   Physical Exam:  vitals were not taken for this visit.   Wt Readings from Last 3 Encounters:  03/08/22 (!) 313 lb 12.8 oz (142.3 kg)  09/06/21 (!) 313 lb (142 kg)  05/09/21 (!) 326 lb 6.4 oz (148.1 kg)    Ocular: Sclerae unicteric, pupils equal, round  and reactive to light Ear-nose-throat: Oropharynx clear, dentition fair Lymphatic: No cervical or supraclavicular adenopathy Lungs no rales or rhonchi, good excursion bilaterally Heart regular rate and rhythm, no murmur appreciated Abd soft, nontender, positive bowel sounds MSK no focal spinal tenderness, no joint edema Neuro: non-focal, well-oriented, appropriate affect Breasts: Deferred   Lab Results  Component Value Date   WBC 4.3 09/06/2022   HGB 11.5 (L) 09/06/2022   HCT 36.0 09/06/2022   MCV 81.6 09/06/2022   PLT 285 09/06/2022   Lab Results  Component Value Date   FERRITIN 49 03/08/2022   IRON 65 03/08/2022   TIBC 346 03/08/2022   UIBC 281 03/08/2022   IRONPCTSAT 19  03/08/2022   Lab Results  Component Value Date   RETICCTPCT 1.0 09/06/2022   RBC 4.41 09/06/2022   RBC 4.46 09/06/2022   No results found for: "KPAFRELGTCHN", "LAMBDASER", "KAPLAMBRATIO" Lab Results  Component Value Date   IGGSERUM 1,722 (H) 07/22/2020   IGA 490 (H) 07/22/2020   IGMSERUM 84 07/22/2020   Lab Results  Component Value Date   TOTALPROTELP 6.5 07/22/2020   TOTALPROTELP 6.8 07/22/2020   ALBUMINELP 3.1 07/22/2020   A1GS 0.2 07/22/2020   A2GS 0.6 07/22/2020   BETS 1.1 07/22/2020   GAMS 1.6 07/22/2020   MSPIKE Not Observed 07/22/2020   SPEI Comment 07/22/2020     Chemistry      Component Value Date/Time   NA 138 07/22/2020 0758   K 3.6 07/22/2020 0758   CL 107 07/22/2020 0758   CO2 24 07/22/2020 0758   BUN 11 07/22/2020 0758   CREATININE 0.74 07/22/2020 0758   CREATININE 0.76 07/14/2020 1208      Component Value Date/Time   CALCIUM 8.6 (L) 07/22/2020 0758   ALKPHOS 61 07/14/2020 1208   AST 17 07/14/2020 1208   ALT 18 07/14/2020 1208   BILITOT 0.7 07/14/2020 1208       Impression and Plan:  Miranda Padilla is a very pleasant 57 yo African American female with iron deficiency anemia and sickle cell trait.  Iron studies are pending.  Follow-up in 6 months.  Eileen Stanford, NP 8/14/20241:27 PM

## 2023-03-09 ENCOUNTER — Inpatient Hospital Stay: Payer: 59 | Attending: Family

## 2023-03-09 ENCOUNTER — Encounter: Payer: Self-pay | Admitting: Family

## 2023-03-09 ENCOUNTER — Inpatient Hospital Stay: Payer: 59 | Admitting: Family

## 2023-03-09 VITALS — BP 124/78 | HR 82 | Temp 98.6°F | Resp 17 | Wt 320.4 lb

## 2023-03-09 DIAGNOSIS — D509 Iron deficiency anemia, unspecified: Secondary | ICD-10-CM | POA: Diagnosis not present

## 2023-03-09 DIAGNOSIS — Z79899 Other long term (current) drug therapy: Secondary | ICD-10-CM | POA: Insufficient documentation

## 2023-03-09 DIAGNOSIS — R591 Generalized enlarged lymph nodes: Secondary | ICD-10-CM | POA: Insufficient documentation

## 2023-03-09 DIAGNOSIS — D573 Sickle-cell trait: Secondary | ICD-10-CM

## 2023-03-09 DIAGNOSIS — D563 Thalassemia minor: Secondary | ICD-10-CM

## 2023-03-09 LAB — CBC WITH DIFFERENTIAL (CANCER CENTER ONLY)
Abs Immature Granulocytes: 0.08 10*3/uL — ABNORMAL HIGH (ref 0.00–0.07)
Basophils Absolute: 0.1 10*3/uL (ref 0.0–0.1)
Basophils Relative: 1 %
Eosinophils Absolute: 0.2 10*3/uL (ref 0.0–0.5)
Eosinophils Relative: 3 %
HCT: 36.9 % (ref 36.0–46.0)
Hemoglobin: 11.7 g/dL — ABNORMAL LOW (ref 12.0–15.0)
Immature Granulocytes: 1 %
Lymphocytes Relative: 23 %
Lymphs Abs: 1.6 10*3/uL (ref 0.7–4.0)
MCH: 25.9 pg — ABNORMAL LOW (ref 26.0–34.0)
MCHC: 31.7 g/dL (ref 30.0–36.0)
MCV: 81.8 fL (ref 80.0–100.0)
Monocytes Absolute: 0.6 10*3/uL (ref 0.1–1.0)
Monocytes Relative: 9 %
Neutro Abs: 4.2 10*3/uL (ref 1.7–7.7)
Neutrophils Relative %: 63 %
Platelet Count: 297 10*3/uL (ref 150–400)
RBC: 4.51 MIL/uL (ref 3.87–5.11)
RDW: 15.7 % — ABNORMAL HIGH (ref 11.5–15.5)
WBC Count: 6.7 10*3/uL (ref 4.0–10.5)
nRBC: 0 % (ref 0.0–0.2)

## 2023-03-09 LAB — IRON AND IRON BINDING CAPACITY (CC-WL,HP ONLY)
Iron: 39 ug/dL (ref 28–170)
Saturation Ratios: 11 % (ref 10.4–31.8)
TIBC: 356 ug/dL (ref 250–450)
UIBC: 317 ug/dL (ref 148–442)

## 2023-03-09 LAB — RETICULOCYTES
Immature Retic Fract: 29.2 % — ABNORMAL HIGH (ref 2.3–15.9)
RBC.: 4.46 MIL/uL (ref 3.87–5.11)
Retic Count, Absolute: 44.2 10*3/uL (ref 19.0–186.0)
Retic Ct Pct: 1 % (ref 0.4–3.1)

## 2023-03-09 LAB — FERRITIN: Ferritin: 60 ng/mL (ref 11–307)

## 2023-03-09 NOTE — Progress Notes (Signed)
Hematology and Oncology Follow Up Visit  Miranda Padilla [id number] Oct 04, 1965 58 y.o. 03/09/2023   Principle Diagnosis:  Iron deficiency anemia  Sickle cell trait Alpha thalassemia trait Enlarged distal retroperitoneal and pelvic lymph nodes   Current Therapy:        IV iron as indicated  Folic acid 1 mg PO daily   Interim History:  [name] is here today with her husband for follow-up. She is doing fairly well but has noted some fatigue, dark circles under her eyes and an episode of SOB which resolved with using her inhaler.  She has not noted any blood loss. No bruising or petechiae.  No fever, chills, n/v, cough, rash, dizziness, SOB, chest pain, palpitations, abdominal pain or changes in bowel or bladder habits at this time.  Some swelling in the feet and ankles at the end of the say when sitting for her job.  Numbness and tingling in the feet unchanged from baseline.  No falls or syncope.  Appetite and hydration are good. Weight is stable at 320 lbs.   ECOG Performance Status: 1 - Symptomatic but completely ambulatory  Medications:  Allergies as of 03/09/2023       Reactions   Other Anaphylaxis, Other (See Comments)   Covid 19 vaccine (brand?)   Sulfonamide Derivatives Itching, Rash   Sulfa Antibiotics Itching, Rash        Medication List        Accurate as of March 09, 2023  1:43 PM. If you have any questions, ask your nurse or doctor.          acetaminophen 500 MG tablet Commonly known as: TYLENOL Take 1,000 mg by mouth every 6 (six) hours as needed for mild pain or moderate pain.   Baclofen 5 MG Tabs Take 5 mg by mouth 3 (three) times daily as needed (for muscle spasms).   folic acid 1 MG tablet Commonly known as: FOLVITE Take 1 mg by mouth daily.   lidocaine 4 % cream Commonly known as: LMX Apply topically 2 (two) times daily.   Melatonin 5 MG Caps Take by mouth at bedtime as needed.   Orencia 250 MG injection Generic drug:  abatacept Inject into the vein every 30 (thirty) days.   zonisamide 100 MG capsule Commonly known as: ZONEGRAN Take 200 mg by mouth daily.        Allergies:  Allergies  Allergen Reactions   Other Anaphylaxis and Other (See Comments)    Covid 19 vaccine (brand?)   Sulfonamide Derivatives Itching and Rash   Sulfa Antibiotics Itching and Rash    Past Medical History, Surgical history, Social history, and Family History were reviewed and updated.  Review of Systems: All other 10 point review of systems is negative.   Physical Exam:  vitals were not taken for this visit.   Wt Readings from Last 3 Encounters:  09/06/22 (!) 313 lb 1.9 oz (142 kg)  03/08/22 (!) 313 lb 12.8 oz (142.3 kg)  09/06/21 (!) 313 lb (142 kg)    Ocular: Sclerae unicteric, pupils equal, round and reactive to light Ear-nose-throat: Oropharynx clear, dentition fair Lymphatic: No cervical or supraclavicular adenopathy Lungs no rales or rhonchi, good excursion bilaterally Heart regular rate and rhythm, no murmur appreciated Abd soft, nontender, positive bowel sounds MSK no focal spinal tenderness, no joint edema Neuro: non-focal, well-oriented, appropriate affect Breasts: Deferred   Lab Results  Component Value Date   WBC 6.7 03/09/2023   HGB 11.7 (L) 03/09/2023   HCT  36.9 03/09/2023   MCV 81.8 03/09/2023   PLT 297 03/09/2023   Lab Results  Component Value Date   FERRITIN 53 09/06/2022   IRON 49 09/06/2022   TIBC 325 09/06/2022   UIBC 276 09/06/2022   IRONPCTSAT 15 09/06/2022   Lab Results  Component Value Date   RETICCTPCT 1.0 03/09/2023   RBC 4.46 03/09/2023   No results found for: "KPAFRELGTCHN", "LAMBDASER", "KAPLAMBRATIO" Lab Results  Component Value Date   IGGSERUM 1,722 (H) 07/22/2020   IGA 490 (H) 07/22/2020   IGMSERUM 84 07/22/2020   Lab Results  Component Value Date   TOTALPROTELP 6.5 07/22/2020   TOTALPROTELP 6.8 07/22/2020   ALBUMINELP 3.1 07/22/2020   A1GS 0.2  07/22/2020   A2GS 0.6 07/22/2020   BETS 1.1 07/22/2020   GAMS 1.6 07/22/2020   MSPIKE Not Observed 07/22/2020   SPEI Comment 07/22/2020     Chemistry      Component Value Date/Time   NA 138 07/22/2020 0758   K 3.6 07/22/2020 0758   CL 107 07/22/2020 0758   CO2 24 07/22/2020 0758   BUN 11 07/22/2020 0758   CREATININE 0.74 07/22/2020 0758   CREATININE 0.76 07/14/2020 1208      Component Value Date/Time   CALCIUM 8.6 (L) 07/22/2020 0758   ALKPHOS 61 07/14/2020 1208   AST 17 07/14/2020 1208   ALT 18 07/14/2020 1208   BILITOT 0.7 07/14/2020 1208       Impression and Plan: [name] is a very pleasant 58 yo African American female with iron deficiency anemia and sickle cell trait.  Iron studies are pending.  Follow-up in 6 months.  Eileen Stanford, NP 2/14/20251:43 PM

## 2023-05-15 ENCOUNTER — Emergency Department (HOSPITAL_BASED_OUTPATIENT_CLINIC_OR_DEPARTMENT_OTHER)
Admission: EM | Admit: 2023-05-15 | Discharge: 2023-05-15 | Disposition: A | Discharge status: Home / Self Care | Attending: Emergency Medicine | Admitting: Emergency Medicine

## 2023-05-15 ENCOUNTER — Encounter (HOSPITAL_BASED_OUTPATIENT_CLINIC_OR_DEPARTMENT_OTHER): Payer: Self-pay | Admitting: Emergency Medicine

## 2023-05-15 ENCOUNTER — Other Ambulatory Visit: Payer: Self-pay

## 2023-05-15 DIAGNOSIS — R7989 Other specified abnormal findings of blood chemistry: Secondary | ICD-10-CM | POA: Insufficient documentation

## 2023-05-15 DIAGNOSIS — R899 Unspecified abnormal finding in specimens from other organs, systems and tissues: Secondary | ICD-10-CM

## 2023-05-15 LAB — COMPREHENSIVE METABOLIC PANEL WITH GFR
ALT: 17 U/L (ref 0–44)
AST: 29 U/L (ref 15–41)
Albumin: 4.1 g/dL (ref 3.5–5.0)
Alkaline Phosphatase: 62 U/L (ref 38–126)
Anion gap: 4 — ABNORMAL LOW (ref 5–15)
BUN: 15 mg/dL (ref 6–20)
CO2: 26 mmol/L (ref 22–32)
Calcium: 8.8 mg/dL — ABNORMAL LOW (ref 8.9–10.3)
Chloride: 107 mmol/L (ref 98–111)
Creatinine, Ser: 0.79 mg/dL (ref 0.44–1.00)
GFR, Estimated: >60 mL/min (ref >60)
Glucose, Bld: 83 mg/dL (ref 70–99)
Potassium: 4.1 mmol/L (ref 3.5–5.1)
Sodium: 138 mmol/L (ref 135–145)
Total Bilirubin: 0.7 mg/dL (ref 0.0–1.2)
Total Protein: 7.9 g/dL (ref 6.5–8.1)

## 2023-05-15 NOTE — ED Provider Notes (Signed)
 [place] [facility] Provider Note   CSN: [id number] Arrival date & time: 05/15/23  0957     History  Chief Complaint  Patient presents with   Abnormal Lab    Miranda Padilla is a 58 y.o. female presents today after having labs drawn yesterday.  Patient states that she was called in advised to come to the ER for an elevated potassium of 6.  Patient denies fever, chills, chest pains, shortness of breath, numbness, tingling, fatigue, nausea, vomiting, or weakness.  Patient does state that when she had her blood drawn yesterday she noted it to be very slow and required multiple sticks.   Abnormal Lab      Home Medications Prior to Admission medications   Medication Sig Start Date End Date Taking? Authorizing Provider  abatacept (ORENCIA) 250 MG injection Inject into the vein every 30 (thirty) days.    [provider]  acetaminophen  (TYLENOL ) 500 MG tablet Take 1,000 mg by mouth every 6 (six) hours as needed for mild pain or moderate pain.    [provider]  Baclofen  5 MG TABS Take 5 mg by mouth 3 (three) times daily as needed (for muscle spasms).    [provider]  folic acid  (FOLVITE ) 1 MG tablet Take 1 mg by mouth daily.    [provider]  lidocaine  (LMX) 4 % cream Apply topically 2 (two) times daily. 07/22/20   Regalado, Belkys A, MD  Melatonin 5 MG CAPS Take by mouth at bedtime as needed.    [provider]  zonisamide  (ZONEGRAN ) 100 MG capsule Take 200 mg by mouth daily.    [provider]      Allergies    Other, Sulfonamide derivatives, and Sulfa antibiotics    Review of Systems   Review of Systems  All other systems reviewed and are negative.   Physical Exam Updated Vital Signs BP 117/82 (BP Location: Left Arm)   Pulse 80   Temp 98.4 F (36.9 C) (Oral)   Resp 13   Ht 5\' 5"  (1.651 m)   Wt (!) 145 kg   LMP 03/24/2016 (Exact Date)   SpO2 100%   BMI 53.20 kg/m   Physical Exam Vitals and nursing note reviewed.  Constitutional:      General: She is not in acute distress.    Appearance: Normal appearance. She is well-developed. She is obese. She is not ill-appearing, toxic-appearing or diaphoretic.  HENT:     Head: Normocephalic and atraumatic.     Right Ear: External ear normal.     Left Ear: External ear normal.     Nose: Nose normal.     Mouth/Throat:     Mouth: Mucous membranes are moist.  Eyes:     Conjunctiva/sclera: Conjunctivae normal.  Cardiovascular:     Rate and Rhythm: Normal rate and regular rhythm.     Pulses: Normal pulses.     Heart sounds: Normal heart sounds. No murmur heard. Pulmonary:     Effort: Pulmonary effort is normal. No respiratory distress.     Breath sounds: Normal breath sounds.  Abdominal:     Palpations: Abdomen is soft.     Tenderness: There is no abdominal tenderness.  Musculoskeletal:        General: No swelling.     Cervical back: Neck supple.  Skin:    General: Skin is warm and dry.     Capillary Refill: Capillary refill takes less than 2 seconds.  Neurological:  General: No focal deficit present.     Mental Status: She is alert and oriented to person, place, and time.     Sensory: No sensory deficit.  Psychiatric:        Mood and Affect: Mood normal.     ED Results / Procedures / Treatments   Labs (all labs ordered are listed, but only abnormal results are displayed) Labs Reviewed  COMPREHENSIVE METABOLIC PANEL WITH GFR - Abnormal; Notable for the following components:      Result Value   Calcium 8.8 (*)    Anion gap 4 (*)    All other components within normal limits    EKG None  Radiology No results found.  Procedures Procedures    Medications Ordered in ED Medications - No data to display  ED Course/ Medical Decision Making/ A&P                                 Medical Decision Making  This patient presents to the ED for concern of elevated potassium differential  diagnosis includes hyperkalemia, hypokalemia, electrolyte abnormality   Lab Tests:  I Ordered, and personally interpreted labs.  The pertinent results include: Potassium 4.1  Considered for admission or further workup however patient's vital signs, physical exam, and labs are reassuring.  Patient's elevated potassium likely due to hemolysis.  Given patient is asymptomatic and has normal potassium while in ED today, I feel patient is safe for discharge at this time.          Final Clinical Impression(s) / ED Diagnoses Final diagnoses:  Abnormal laboratory test result    Rx / DC Orders ED Discharge Orders     None         Merryl Abraham 05/15/23 1119    Mordecai Applebaum, MD 05/15/23 1422

## 2023-05-15 NOTE — ED Triage Notes (Signed)
 States had labs drawn yesterday and  was called and said to come to ER for elevated potassium (6)

## 2023-05-15 NOTE — Discharge Instructions (Addendum)
 Today you were seen for an abnormal laboratory result.  I suspect that this was likely due to hemolysis as your potassium was normal in the ED today.  Given you are not experiencing any symptoms I feel you are safe for discharge at this time.  Thank you for letting us  treat you today. After reviewing your labs, I feel you are safe to go home. Please follow up with your PCP in the next several days and provide them with your records from this visit. Return to the Emergency Room if pain becomes severe or symptoms worsen.

## 2023-09-07 ENCOUNTER — Inpatient Hospital Stay (HOSPITAL_BASED_OUTPATIENT_CLINIC_OR_DEPARTMENT_OTHER): Payer: 59 | Admitting: Family

## 2023-09-07 ENCOUNTER — Inpatient Hospital Stay: Payer: 59 | Attending: Family

## 2023-09-07 VITALS — BP 130/82 | HR 81 | Temp 98.9°F | Resp 19 | Ht 65.0 in | Wt 314.8 lb

## 2023-09-07 DIAGNOSIS — D573 Sickle-cell trait: Secondary | ICD-10-CM | POA: Diagnosis not present

## 2023-09-07 DIAGNOSIS — R590 Localized enlarged lymph nodes: Secondary | ICD-10-CM | POA: Insufficient documentation

## 2023-09-07 DIAGNOSIS — D509 Iron deficiency anemia, unspecified: Secondary | ICD-10-CM | POA: Insufficient documentation

## 2023-09-07 DIAGNOSIS — Z79899 Other long term (current) drug therapy: Secondary | ICD-10-CM | POA: Diagnosis not present

## 2023-09-07 DIAGNOSIS — D563 Thalassemia minor: Secondary | ICD-10-CM

## 2023-09-07 LAB — CBC WITH DIFFERENTIAL (CANCER CENTER ONLY)
Abs Immature Granulocytes: 0.04 K/uL (ref 0.00–0.07)
Basophils Absolute: 0.1 K/uL (ref 0.0–0.1)
Basophils Relative: 1 %
Eosinophils Absolute: 0.1 K/uL (ref 0.0–0.5)
Eosinophils Relative: 2 %
HCT: 37.7 % (ref 36.0–46.0)
Hemoglobin: 11.9 g/dL — ABNORMAL LOW (ref 12.0–15.0)
Immature Granulocytes: 1 %
Lymphocytes Relative: 28 %
Lymphs Abs: 1.5 K/uL (ref 0.7–4.0)
MCH: 25.8 pg — ABNORMAL LOW (ref 26.0–34.0)
MCHC: 31.6 g/dL (ref 30.0–36.0)
MCV: 81.6 fL (ref 80.0–100.0)
Monocytes Absolute: 0.7 K/uL (ref 0.1–1.0)
Monocytes Relative: 13 %
Neutro Abs: 2.9 K/uL (ref 1.7–7.7)
Neutrophils Relative %: 55 %
Platelet Count: 301 K/uL (ref 150–400)
RBC: 4.62 MIL/uL (ref 3.87–5.11)
RDW: 15.8 % — ABNORMAL HIGH (ref 11.5–15.5)
WBC Count: 5.3 K/uL (ref 4.0–10.5)
nRBC: 0 % (ref 0.0–0.2)

## 2023-09-07 LAB — IRON AND IRON BINDING CAPACITY (CC-WL,HP ONLY)
Iron: 64 ug/dL (ref 28–170)
Saturation Ratios: 16 % (ref 10.4–31.8)
TIBC: 396 ug/dL (ref 250–450)
UIBC: 332 ug/dL

## 2023-09-07 LAB — RETICULOCYTES
Immature Retic Fract: 16.3 % — ABNORMAL HIGH (ref 2.3–15.9)
RBC.: 4.53 MIL/uL (ref 3.87–5.11)
Retic Count, Absolute: 77 K/uL (ref 19.0–186.0)
Retic Ct Pct: 1.7 % (ref 0.4–3.1)

## 2023-09-07 LAB — FERRITIN: Ferritin: 208 ng/mL (ref 11–307)

## 2023-09-07 NOTE — Progress Notes (Signed)
 Hematology and Oncology Follow Up Visit  Miranda Padilla [id number] 02/02/1965 58 y.o. 09/07/2023   Principle Diagnosis:  Iron deficiency anemia  Sickle cell trait Alpha thalassemia trait Enlarged distal retroperitoneal and pelvic lymph nodes   Current Therapy:        IV iron as indicated  Folic acid  1 mg PO daily   Interim History:  [name] is here today for follow-up. She is feeling fatigued. She has had issues with back pain and has not been sleeping well.  She had an ablation earlier this week to treat the facetogenic back pain. She states that this can take up to a week to help and so far she has not noted any improvement with the pain.  No falls or syncope reported.  She has not noted any blood loss. Some bruising on the arms but not in excess. No petechiae.  Occasional palpitations and mild SOB with exertion.  No fever, chills, n/v, cough, rash, dizziness, chest pain, abdominal pain or changes in bowel or bladder habits.  Appetite and hydration are good. Weight is stable at 314 lbs.   ECOG Performance Status: 1 - Symptomatic but completely ambulatory  Medications:  Allergies as of 09/07/2023       Reactions   Other Anaphylaxis, Other (See Comments)   Covid 19 vaccine (brand?)   Sulfonamide Derivatives Itching, Rash   Sulfa Antibiotics Itching, Rash        Medication List        Accurate as of September 07, 2023  1:39 PM. If you have any questions, ask your nurse or doctor.          acetaminophen  500 MG tablet Commonly known as: TYLENOL  Take 1,000 mg by mouth every 6 (six) hours as needed for mild pain or moderate pain.   Baclofen  5 MG Tabs Take 5 mg by mouth 3 (three) times daily as needed (for muscle spasms).   folic acid  1 MG tablet Commonly known as: FOLVITE  Take 1 mg by mouth daily.   lidocaine  4 % cream Commonly known as: LMX Apply topically 2 (two) times daily.   Melatonin 5 MG Caps Take by mouth at bedtime as needed.    Orencia 250 MG injection Generic drug: abatacept Inject into the vein every 30 (thirty) days.   zonisamide  100 MG capsule Commonly known as: ZONEGRAN  Take 200 mg by mouth daily.        Allergies:  Allergies  Allergen Reactions   Other Anaphylaxis and Other (See Comments)    Covid 19 vaccine (brand?)   Sulfonamide Derivatives Itching and Rash   Sulfa Antibiotics Itching and Rash    Past Medical History, Surgical history, Social history, and Family History were reviewed and updated.  Review of Systems: All other 10 point review of systems is negative.   Physical Exam:  height is 5' 5 (1.651 m) and weight is 314 lb 12.8 oz (142.8 kg) (abnormal). Her oral temperature is 98.9 F (37.2 C). Her blood pressure is 130/82 and her pulse is 81. Her respiration is 19 and oxygen saturation is 99%.   Wt Readings from Last 3 Encounters:  09/07/23 (!) 314 lb 12.8 oz (142.8 kg)  05/15/23 (!) 319 lb 10.7 oz (145 kg)  03/09/23 (!) 320 lb 6.4 oz (145.3 kg)    Ocular: Sclerae unicteric, pupils equal, round and reactive to light Ear-nose-throat: Oropharynx clear, dentition fair Lymphatic: No cervical or supraclavicular adenopathy Lungs no rales or rhonchi, good excursion bilaterally Heart regular rate and rhythm,  no murmur appreciated Abd soft, nontender, positive bowel sounds MSK no focal spinal tenderness, no joint edema Neuro: non-focal, well-oriented, appropriate affect Breasts: Deferred   Lab Results  Component Value Date   WBC 5.3 09/07/2023   HGB 11.9 (L) 09/07/2023   HCT 37.7 09/07/2023   MCV 81.6 09/07/2023   PLT 301 09/07/2023   Lab Results  Component Value Date   FERRITIN 60 03/09/2023   IRON 39 03/09/2023   TIBC 356 03/09/2023   UIBC 317 03/09/2023   IRONPCTSAT 11 03/09/2023   Lab Results  Component Value Date   RETICCTPCT 1.7 09/07/2023   RBC 4.53 09/07/2023   No results found for: JONATHAN BONG [facility] Lab Results  Component Value  Date   IGGSERUM 1,722 (H) 07/22/2020   IGA 490 (H) 07/22/2020   IGMSERUM 84 07/22/2020   Lab Results  Component Value Date   TOTALPROTELP 6.5 07/22/2020   TOTALPROTELP 6.8 07/22/2020   ALBUMINELP 3.1 07/22/2020   A1GS 0.2 07/22/2020   A2GS 0.6 07/22/2020   BETS 1.1 07/22/2020   GAMS 1.6 07/22/2020   MSPIKE Not Observed 07/22/2020   SPEI Comment 07/22/2020     Chemistry      Component Value Date/Time   NA 138 05/15/2023 1020   K 4.1 05/15/2023 1020   CL 107 05/15/2023 1020   CO2 26 05/15/2023 1020   BUN 15 05/15/2023 1020   CREATININE 0.79 05/15/2023 1020   CREATININE 0.76 07/14/2020 1208      Component Value Date/Time   CALCIUM 8.8 (L) 05/15/2023 1020   ALKPHOS 62 05/15/2023 1020   AST 29 05/15/2023 1020   AST 17 07/14/2020 1208   ALT 17 05/15/2023 1020   ALT 18 07/14/2020 1208   BILITOT 0.7 05/15/2023 1020   BILITOT 0.7 07/14/2020 1208       Impression and Plan: [name] is a very pleasant 59 yo African American female with iron deficiency anemia and sickle cell trait.  Iron studies are pending.  Follow-up in 6 months.  Lauraine Pepper, NP 8/15/20251:39 PM

## 2024-03-10 ENCOUNTER — Inpatient Hospital Stay

## 2024-03-10 ENCOUNTER — Ambulatory Visit: Admitting: Family
